# Patient Record
Sex: Male | Born: 1935 | Race: White | Hispanic: No | Marital: Single | Smoking: Never smoker
Health system: Southern US, Community
[De-identification: ages and names within clinical notes are randomized; demographics above are authoritative.]

## PROBLEM LIST (undated history)

## (undated) DIAGNOSIS — I639 Cerebral infarction, unspecified: Secondary | ICD-10-CM

## (undated) DIAGNOSIS — R0602 Shortness of breath: Secondary | ICD-10-CM

## (undated) DIAGNOSIS — K2270 Barrett's esophagus without dysplasia: Secondary | ICD-10-CM

## (undated) DIAGNOSIS — N289 Disorder of kidney and ureter, unspecified: Secondary | ICD-10-CM

## (undated) DIAGNOSIS — M1990 Unspecified osteoarthritis, unspecified site: Secondary | ICD-10-CM

## (undated) DIAGNOSIS — D649 Anemia, unspecified: Secondary | ICD-10-CM

## (undated) DIAGNOSIS — K635 Polyp of colon: Secondary | ICD-10-CM

## (undated) DIAGNOSIS — F0390 Unspecified dementia without behavioral disturbance: Secondary | ICD-10-CM

## (undated) DIAGNOSIS — F17220 Nicotine dependence, chewing tobacco, uncomplicated: Secondary | ICD-10-CM

## (undated) DIAGNOSIS — Z9889 Other specified postprocedural states: Secondary | ICD-10-CM

## (undated) HISTORY — PX: ESOPHAGOGASTRODUODENOSCOPY: SHX1529

## (undated) HISTORY — PX: COLONOSCOPY: SHX174

## (undated) SURGERY — Surgical Case
Anesthesia: *Unknown

---

## 1968-06-25 HISTORY — PX: OTHER SURGICAL HISTORY: SHX169

## 1999-01-04 ENCOUNTER — Inpatient Hospital Stay (HOSPITAL_COMMUNITY): Admission: RE | Admit: 1999-01-04 | Discharge: 1999-01-05 | Payer: Self-pay | Admitting: *Deleted

## 1999-01-04 ENCOUNTER — Encounter: Payer: Self-pay | Admitting: *Deleted

## 1999-08-14 ENCOUNTER — Other Ambulatory Visit: Admission: RE | Admit: 1999-08-14 | Discharge: 1999-08-14 | Payer: Self-pay | Admitting: Gastroenterology

## 1999-08-14 ENCOUNTER — Encounter (INDEPENDENT_AMBULATORY_CARE_PROVIDER_SITE_OTHER): Payer: Self-pay

## 2001-02-05 ENCOUNTER — Encounter: Admission: RE | Admit: 2001-02-05 | Discharge: 2001-02-05 | Payer: Self-pay | Admitting: Neurosurgery

## 2001-02-05 ENCOUNTER — Encounter: Payer: Self-pay | Admitting: Neurosurgery

## 2001-02-19 ENCOUNTER — Encounter: Payer: Self-pay | Admitting: Neurosurgery

## 2001-02-19 ENCOUNTER — Encounter: Admission: RE | Admit: 2001-02-19 | Discharge: 2001-02-19 | Payer: Self-pay | Admitting: Neurosurgery

## 2001-03-12 ENCOUNTER — Encounter: Admission: RE | Admit: 2001-03-12 | Discharge: 2001-03-12 | Payer: Self-pay | Admitting: Neurosurgery

## 2001-03-12 ENCOUNTER — Encounter: Payer: Self-pay | Admitting: Neurosurgery

## 2002-04-13 ENCOUNTER — Encounter: Payer: Self-pay | Admitting: Orthopedic Surgery

## 2002-04-13 ENCOUNTER — Encounter: Admission: RE | Admit: 2002-04-13 | Discharge: 2002-04-13 | Payer: Self-pay | Admitting: Orthopedic Surgery

## 2005-04-17 ENCOUNTER — Ambulatory Visit: Payer: Self-pay | Admitting: Gastroenterology

## 2005-05-07 ENCOUNTER — Ambulatory Visit: Payer: Self-pay | Admitting: Gastroenterology

## 2005-05-14 ENCOUNTER — Ambulatory Visit: Payer: Self-pay | Admitting: Gastroenterology

## 2005-05-14 ENCOUNTER — Encounter (INDEPENDENT_AMBULATORY_CARE_PROVIDER_SITE_OTHER): Payer: Self-pay | Admitting: *Deleted

## 2005-05-14 DIAGNOSIS — K635 Polyp of colon: Secondary | ICD-10-CM

## 2005-05-14 DIAGNOSIS — Z9889 Other specified postprocedural states: Secondary | ICD-10-CM

## 2005-05-14 DIAGNOSIS — K2270 Barrett's esophagus without dysplasia: Secondary | ICD-10-CM

## 2005-05-14 HISTORY — DX: Other specified postprocedural states: Z98.890

## 2005-05-14 HISTORY — DX: Barrett's esophagus without dysplasia: K22.70

## 2005-05-14 HISTORY — DX: Polyp of colon: K63.5

## 2005-06-05 ENCOUNTER — Ambulatory Visit: Payer: Self-pay | Admitting: Gastroenterology

## 2006-06-25 HISTORY — PX: PROSTATE SURGERY: SHX751

## 2007-06-05 ENCOUNTER — Ambulatory Visit: Payer: Self-pay | Admitting: Internal Medicine

## 2007-06-05 ENCOUNTER — Inpatient Hospital Stay (HOSPITAL_COMMUNITY): Admission: EM | Admit: 2007-06-05 | Discharge: 2007-06-08 | Payer: Self-pay | Admitting: Emergency Medicine

## 2010-11-07 NOTE — Procedures (Signed)
[name], [name]               ACCOUNT NO.:  [id number]   MEDICAL RECORD NO.:  [id number]          PATIENT TYPE:  INP   LOCATION:  A228                          FACILITY:  [facility]   PHYSICIAN:  Pricilla Riffle, MD, FACCDATE OF BIRTH:  Aug 14, 1926   DATE OF PROCEDURE:  DATE OF DISCHARGE:                                ECHOCARDIOGRAM   INDICATIONS:  A 77 year old with history of TIA.   A 2-D echo with echo Doppler, left ventricles grossly normal in size.  End-diastolic dimension is 43 mm.  Interventricular septum and posterior  wall are mildly thickened at 14 and 15 mm each.  Left atrium is normal  at 39 mm.  Right atrium and right ventricle are normal.   The aortic valve is trileaflet, is mild to mild sclerosis, no stenosis,  no insufficiency.  Mitral valve is minimally thickened.  There is no  insufficiency.  Pulmonic valve is normal.  Tricuspid valve is normal  with trace insufficiency.   Overall LV systolic function is normal with an LVEF of approximately 55%  to 60%.  RVEF is normal.   Trivial pericardial effusion is seen.      Pricilla Riffle, MD, [facility]  Electronically Signed     PVR/MEDQ  D:  06/05/2007  T:  06/05/2007  Job:  [id number]

## 2010-11-07 NOTE — H&P (Signed)
[name], [name]               ACCOUNT NO.:  [id number]   MEDICAL RECORD NO.:  [id number]          PATIENT TYPE:  INP   LOCATION:  A228                          FACILITY:  [facility]   PHYSICIAN:  Tesfaye D. Felecia Shelling, MD   DATE OF BIRTH:  1926/10/05   DATE OF ADMISSION:  06/05/2007  DATE OF DISCHARGE:  LH                              HISTORY & PHYSICAL   CHIEF COMPLAINT:  Left-sided weakness.   HISTORY OF PRESENT ILLNESS:  This is a 77 year old male patient with  history of multiple medical illnesses, who was a resident of [facility].  He was brought to the emergency room with the above  complaint.  According to the nursing staff, the patient had a left-sided  weakness and drowsiness.  His blood sugar was 38.  The patient was given  D50 and he was brought to the emergency room.  By the time he was  evaluated in the emergency room, his weakness was gradually improving.  The patient was evaluated in the emergency room and he had a CT scan of  the head, which was negative for any acute findings.  The patient was  admitted under telemetry for further evaluation.   REVIEW OF SYSTEMS:  The patient denies headache, cough, fever or chills,  chest pain, nausea or vomiting, abdominal pain, dysuria, urgency or  frequency of urination.   PAST MEDICAL HISTORY:  1. History of accidental fall.  2. History of left humerus fracture, secondary to the above.  3. Anemia.  4. History of ataxia, etiology unknown.  5. Hypertension.  6. Dementia.  7. Diabetes mellitus.  8. History of carcinoma of the prostate.   CURRENT MEDICATIONS:  1. Actos 15 mg daily.  2. Celexa 20 mg daily.  3. Dilantin 320 mg daily.  4. Accu-Chek with sliding scale.  5. Glipizide 10 mg daily.  6. Metoprolol 180 mg daily.  7. Pyridium 100 mg t.i.d.  8. Xanax 0.5 mg daily.  9. Lortab 5/500 one tablet p.o. q.6 h.   SOCIAL HISTORY:  The patient is a resident of [facility].  No  history of alcohol, tobacco or  substance abuse.   PHYSICAL EXAMINATION:  The patient is alert, awake and responding to  verbal communication.Marland Kitchen  VITAL SIGNS:  Blood pressure 164/66, pulse 68, respiratory rate 20,  temperature 98 degrees Fahrenheit.  HEENT: Pupils are equal and reactive.  NECK:  Supple.  CHEST: Clear lung fields; good air entry.  CARDIOVASCULAR SYSTEM:  First and second heart sound heard.  No murmur,  no gallop.  ABDOMEN:  Soft and lax.  Bowel sounds are positive.  No mass  or organomegaly.  EXTREMITIES:  No leg edema.   LABS ON ADMISSION:  Sodium 141, potassium 4.7, chloride 114, carbon  dioxide 22, glucose 47, BUN 22, creatinine 1.7. calcium 9.3.  CBC:  WBC  5.7, hemoglobin 10.3, hematocrit 31.5, platelets 179.  Urinalysis:  Specific gravity 1020, pH 5.0, nitrite positive, glucose trace negative  and leukocytes negative.   ASSESSMENT:  1. TRANSIENT LEFT-SIDED WEAKNESS.  To rule out cerebrovascular  accident  versus transient ischemic attack.  2. Hypoglycemia.  3. Hypertension.  4. History of accidental fall.  With left humerus fracture.  5. History of ataxia.  6. History of carcinoma of the prostate.  7. Anemia.   PLAN:  Will admit the patient under telemetry.  Will do neurologic  checks q.4 h.  Will obtain a neurology consult.  Will schedule for  carotid Doppler and echocardiogram.  Will continue regular medications.      Tesfaye D. Felecia Shelling, MD  Electronically Signed     TDF/MEDQ  D:  06/05/2007  T:  06/05/2007  Job:  [id number]

## 2010-11-10 NOTE — Discharge Summary (Signed)
[name], [name]               ACCOUNT NO.:  [id number]   MEDICAL RECORD NO.:  [id number]          PATIENT TYPE:  INP   LOCATION:  A228                          FACILITY:  [facility]   PHYSICIAN:  Tesfaye D. Felecia Shelling, MD   DATE OF BIRTH:  [birth year]   DATE OF ADMISSION:  06/05/2007  DATE OF DISCHARGE:  12/14/2008LH                               DISCHARGE SUMMARY   DISCHARGE DIAGNOSES:  1. Transient right-sided weakness, probably secondary to transient      ischemic attack.  2. History of recurrent falls, with left humerus fracture.  3. Diabetes mellitus.  4. Hypertension.  5. History of dementia.   HOSPITAL COURSE:  This is a 77 year old male patient with history of  multiple medical illnesses who was a resident of [facility],  who suddenly developed left-sided weakness.  At the time of evaluation,  the patient had a blood sugar of 38.  She was given D50, and her blood  sugar improved.  Her CT scan of the head and later MRA were negative for  any acute change.  The patient improved over the hospital stay, and she  was discharged to home in a stable condition, to continue her regular  treatment.      Tesfaye D. Felecia Shelling, MD  Electronically Signed     TDF/MEDQ  D:  06/30/2007  T:  06/30/2007  Job:  [id number]

## 2011-04-02 LAB — DIFFERENTIAL
Basophils Relative: 0
Eosinophils Absolute: 0.1 — ABNORMAL LOW
Eosinophils Relative: 2
Lymphs Abs: 1
Monocytes Absolute: 0.4
Monocytes Relative: 8

## 2011-04-02 LAB — URINALYSIS, ROUTINE W REFLEX MICROSCOPIC
Glucose, UA: NEGATIVE
Hgb urine dipstick: NEGATIVE
Ketones, ur: NEGATIVE
Protein, ur: 30 — AB
Specific Gravity, Urine: 1.02

## 2011-04-02 LAB — PROTIME-INR: INR: 1

## 2011-04-02 LAB — CBC
MCV: 90.7
Platelets: 179
RBC: 3.48 — ABNORMAL LOW
RDW: 15.3

## 2011-04-02 LAB — COMPREHENSIVE METABOLIC PANEL
ALT: 10
AST: 15
Albumin: 3.1 — ABNORMAL LOW
CO2: 24
Calcium: 9.3
GFR calc Af Amer: 53 — ABNORMAL LOW
Total Protein: 7.2

## 2011-04-19 ENCOUNTER — Emergency Department (HOSPITAL_COMMUNITY): Payer: Medicare PPO

## 2011-04-19 ENCOUNTER — Other Ambulatory Visit (HOSPITAL_COMMUNITY): Payer: PRIVATE HEALTH INSURANCE

## 2011-04-19 ENCOUNTER — Inpatient Hospital Stay (HOSPITAL_COMMUNITY)
Admission: EM | Admit: 2011-04-19 | Discharge: 2011-04-26 | DRG: 563 | Disposition: A | Discharge status: Skilled Nursing Facility | Payer: Medicare PPO | Attending: Internal Medicine | Admitting: Internal Medicine

## 2011-04-19 ENCOUNTER — Other Ambulatory Visit: Payer: Self-pay

## 2011-04-19 ENCOUNTER — Encounter: Payer: Self-pay | Admitting: *Deleted

## 2011-04-19 DIAGNOSIS — N182 Chronic kidney disease, stage 2 (mild): Secondary | ICD-10-CM | POA: Diagnosis present

## 2011-04-19 DIAGNOSIS — K209 Esophagitis, unspecified without bleeding: Secondary | ICD-10-CM | POA: Diagnosis present

## 2011-04-19 DIAGNOSIS — F0390 Unspecified dementia without behavioral disturbance: Secondary | ICD-10-CM | POA: Diagnosis present

## 2011-04-19 DIAGNOSIS — D126 Benign neoplasm of colon, unspecified: Secondary | ICD-10-CM | POA: Diagnosis present

## 2011-04-19 DIAGNOSIS — N179 Acute kidney failure, unspecified: Secondary | ICD-10-CM | POA: Diagnosis present

## 2011-04-19 DIAGNOSIS — D638 Anemia in other chronic diseases classified elsewhere: Secondary | ICD-10-CM | POA: Diagnosis present

## 2011-04-19 DIAGNOSIS — N400 Enlarged prostate without lower urinary tract symptoms: Secondary | ICD-10-CM | POA: Diagnosis present

## 2011-04-19 DIAGNOSIS — I129 Hypertensive chronic kidney disease with stage 1 through stage 4 chronic kidney disease, or unspecified chronic kidney disease: Secondary | ICD-10-CM | POA: Diagnosis present

## 2011-04-19 DIAGNOSIS — E119 Type 2 diabetes mellitus without complications: Secondary | ICD-10-CM | POA: Diagnosis present

## 2011-04-19 DIAGNOSIS — I10 Essential (primary) hypertension: Secondary | ICD-10-CM | POA: Diagnosis present

## 2011-04-19 DIAGNOSIS — K620 Anal polyp: Secondary | ICD-10-CM | POA: Diagnosis present

## 2011-04-19 DIAGNOSIS — D649 Anemia, unspecified: Secondary | ICD-10-CM | POA: Diagnosis present

## 2011-04-19 DIAGNOSIS — S42213A Unspecified displaced fracture of surgical neck of unspecified humerus, initial encounter for closed fracture: Principal | ICD-10-CM | POA: Diagnosis present

## 2011-04-19 DIAGNOSIS — I639 Cerebral infarction, unspecified: Secondary | ICD-10-CM | POA: Diagnosis present

## 2011-04-19 DIAGNOSIS — K259 Gastric ulcer, unspecified as acute or chronic, without hemorrhage or perforation: Secondary | ICD-10-CM | POA: Diagnosis present

## 2011-04-19 DIAGNOSIS — K2270 Barrett's esophagus without dysplasia: Secondary | ICD-10-CM | POA: Diagnosis present

## 2011-04-19 DIAGNOSIS — W19XXXA Unspecified fall, initial encounter: Secondary | ICD-10-CM | POA: Diagnosis present

## 2011-04-19 DIAGNOSIS — K648 Other hemorrhoids: Secondary | ICD-10-CM | POA: Diagnosis present

## 2011-04-19 DIAGNOSIS — Z23 Encounter for immunization: Secondary | ICD-10-CM

## 2011-04-19 DIAGNOSIS — T39095A Adverse effect of salicylates, initial encounter: Secondary | ICD-10-CM | POA: Diagnosis present

## 2011-04-19 DIAGNOSIS — S42209A Unspecified fracture of upper end of unspecified humerus, initial encounter for closed fracture: Secondary | ICD-10-CM | POA: Diagnosis present

## 2011-04-19 HISTORY — DX: Polyp of colon: K63.5

## 2011-04-19 HISTORY — DX: Barrett's esophagus without dysplasia: K22.70

## 2011-04-19 HISTORY — DX: Other specified postprocedural states: Z98.890

## 2011-04-19 HISTORY — DX: Unspecified dementia without behavioral disturbance: F03.90

## 2011-04-19 HISTORY — DX: Anemia, unspecified: D64.9

## 2011-04-19 HISTORY — DX: Shortness of breath: R06.02

## 2011-04-19 HISTORY — DX: Unspecified dementia, unspecified severity, without behavioral disturbance, psychotic disturbance, mood disturbance, and anxiety: F03.90

## 2011-04-19 HISTORY — DX: Unspecified osteoarthritis, unspecified site: M19.90

## 2011-04-19 HISTORY — DX: Nicotine dependence, chewing tobacco, uncomplicated: F17.220

## 2011-04-19 LAB — BASIC METABOLIC PANEL
BUN: 36 mg/dL — ABNORMAL HIGH (ref 6–23)
CO2: 20 mEq/L (ref 19–32)
Calcium: 9.8 mg/dL (ref 8.4–10.5)
Creatinine, Ser: 2.73 mg/dL — ABNORMAL HIGH (ref 0.50–1.35)
GFR calc non Af Amer: 20 mL/min — ABNORMAL LOW (ref >90)
Glucose, Bld: 81 mg/dL (ref 70–99)
Sodium: 138 mEq/L (ref 135–145)

## 2011-04-19 LAB — CBC
HCT: 25.5 % — ABNORMAL LOW (ref 39.0–52.0)
Hemoglobin: 8.1 g/dL — ABNORMAL LOW (ref 13.0–17.0)
MCH: 28.7 pg (ref 26.0–34.0)
MCHC: 31.8 g/dL (ref 30.0–36.0)
MCV: 90.4 fL (ref 78.0–100.0)
RBC: 2.82 MIL/uL — ABNORMAL LOW (ref 4.22–5.81)
RDW: 14.5 % (ref 11.5–15.5)
WBC: 11.2 10*3/uL — ABNORMAL HIGH (ref 4.0–10.5)

## 2011-04-19 LAB — GLUCOSE, CAPILLARY
Glucose-Capillary: 109 mg/dL — ABNORMAL HIGH (ref 70–99)
Glucose-Capillary: 85 mg/dL (ref 70–99)

## 2011-04-19 LAB — DIFFERENTIAL
Basophils Absolute: 0 10*3/uL (ref 0.0–0.1)
Basophils Relative: 0 % (ref 0–1)
Lymphs Abs: 0.5 10*3/uL — ABNORMAL LOW (ref 0.7–4.0)
Monocytes Absolute: 0.7 10*3/uL (ref 0.1–1.0)
Neutro Abs: 10 10*3/uL — ABNORMAL HIGH (ref 1.7–7.7)
Neutrophils Relative %: 89 % — ABNORMAL HIGH (ref 43–77)

## 2011-04-19 LAB — URINALYSIS, ROUTINE W REFLEX MICROSCOPIC
Protein, ur: NEGATIVE mg/dL
Specific Gravity, Urine: 1.02 (ref 1.005–1.030)
Urobilinogen, UA: 0.2 mg/dL (ref 0.0–1.0)

## 2011-04-19 LAB — RETICULOCYTES: Retic Count, Absolute: 45.2 10*3/uL (ref 19.0–186.0)

## 2011-04-19 MED ORDER — TAMSULOSIN HCL 0.4 MG PO CAPS
0.4000 mg | ORAL_CAPSULE | Freq: Every day | ORAL | Status: DC
  Administered 2011-04-20 – 2011-04-26 (×7): 0.4 mg via ORAL
  Filled 2011-04-19 (×8): qty 1

## 2011-04-19 MED ORDER — ASPIRIN 300 MG RE SUPP
300.0000 mg | Freq: Every day | RECTAL | Status: DC
  Filled 2011-04-19 (×4): qty 1

## 2011-04-19 MED ORDER — ONDANSETRON HCL 4 MG/2ML IJ SOLN: 4.0000 mg | Freq: Four times a day (QID) | INTRAMUSCULAR | Status: DC | PRN

## 2011-04-19 MED ORDER — INSULIN ASPART 100 UNIT/ML ~~LOC~~ SOLN
0.0000 [IU] | Freq: Three times a day (TID) | SUBCUTANEOUS | Status: DC
  Administered 2011-04-20: 1 [IU] via SUBCUTANEOUS
  Administered 2011-04-20 – 2011-04-22 (×2): 2 [IU] via SUBCUTANEOUS
  Administered 2011-04-22 – 2011-04-26 (×3): 1 [IU] via SUBCUTANEOUS
  Filled 2011-04-19: qty 3
  Filled 2011-04-19: qty 10

## 2011-04-19 MED ORDER — SENNA 8.6 MG PO TABS
2.0000 | ORAL_TABLET | Freq: Every day | ORAL | Status: DC | PRN
  Filled 2011-04-19: qty 2

## 2011-04-19 MED ORDER — OLMESARTAN MEDOXOMIL 20 MG PO TABS
20.0000 mg | ORAL_TABLET | Freq: Every day | ORAL | Status: DC
  Filled 2011-04-19 (×2): qty 1

## 2011-04-19 MED ORDER — CHLORHEXIDINE GLUCONATE 0.12 % MT SOLN
15.0000 mL | Freq: Two times a day (BID) | OROMUCOSAL | Status: DC
  Administered 2011-04-19 – 2011-04-25 (×11): 15 mL via OROMUCOSAL
  Filled 2011-04-19 (×11): qty 15

## 2011-04-19 MED ORDER — SODIUM CHLORIDE 0.9 % IV SOLN
INTRAVENOUS | Status: DC
  Administered 2011-04-19: 15:00:00 via INTRAVENOUS

## 2011-04-19 MED ORDER — LIDOCAINE VISCOUS 2 % MT SOLN: 20.0000 mL | OROMUCOSAL | Status: DC | PRN

## 2011-04-19 MED ORDER — ONDANSETRON HCL 4 MG PO TABS: 4.0000 mg | ORAL_TABLET | Freq: Four times a day (QID) | ORAL | Status: DC | PRN

## 2011-04-19 MED ORDER — GUAIFENESIN-DM 100-10 MG/5ML PO SYRP: 5.0000 mL | ORAL_SOLUTION | ORAL | Status: DC | PRN

## 2011-04-19 MED ORDER — POLYETHYLENE GLYCOL 3350 17 G PO PACK
17.0000 g | PACK | Freq: Every day | ORAL | Status: DC
  Administered 2011-04-20 – 2011-04-23 (×4): 17 g via ORAL
  Filled 2011-04-19 (×5): qty 1

## 2011-04-19 MED ORDER — INSULIN ASPART 100 UNIT/ML ~~LOC~~ SOLN: 0.0000 [IU] | Freq: Three times a day (TID) | SUBCUTANEOUS | Status: DC

## 2011-04-19 MED ORDER — ASPIRIN EC 325 MG PO TBEC
325.0000 mg | DELAYED_RELEASE_TABLET | Freq: Every day | ORAL | Status: DC
  Administered 2011-04-20: 325 mg via ORAL
  Filled 2011-04-19: qty 1

## 2011-04-19 MED ORDER — ALBUTEROL SULFATE (5 MG/ML) 0.5% IN NEBU: 2.5000 mg | INHALATION_SOLUTION | RESPIRATORY_TRACT | Status: DC | PRN

## 2011-04-19 MED ORDER — HYDROCODONE-ACETAMINOPHEN 5-325 MG PO TABS
1.0000 | ORAL_TABLET | ORAL | Status: DC | PRN
  Administered 2011-04-20: 2 via ORAL
  Administered 2011-04-20: 1 via ORAL
  Administered 2011-04-21 – 2011-04-24 (×3): 2 via ORAL
  Filled 2011-04-19: qty 2
  Filled 2011-04-19: qty 1
  Filled 2011-04-19 (×3): qty 2

## 2011-04-19 MED ORDER — ACETAMINOPHEN 325 MG PO TABS: 650.0000 mg | ORAL_TABLET | Freq: Four times a day (QID) | ORAL | Status: DC | PRN

## 2011-04-19 MED ORDER — ALUM & MAG HYDROXIDE-SIMETH 200-200-20 MG/5ML PO SUSP: 30.0000 mL | Freq: Four times a day (QID) | ORAL | Status: DC | PRN

## 2011-04-19 MED ORDER — ZOLPIDEM TARTRATE 5 MG PO TABS
5.0000 mg | ORAL_TABLET | Freq: Every evening | ORAL | Status: DC | PRN
  Administered 2011-04-19: 5 mg via ORAL
  Filled 2011-04-19: qty 1

## 2011-04-19 MED ORDER — SENNOSIDES-DOCUSATE SODIUM 8.6-50 MG PO TABS
1.0000 | ORAL_TABLET | Freq: Every evening | ORAL | Status: DC | PRN
  Filled 2011-04-19: qty 1

## 2011-04-19 MED ORDER — PNEUMOCOCCAL VAC POLYVALENT 25 MCG/0.5ML IJ INJ
0.5000 mL | INJECTION | INTRAMUSCULAR | Status: AC
  Administered 2011-04-20: 0.5 mL via INTRAMUSCULAR
  Filled 2011-04-19: qty 0.5

## 2011-04-19 MED ORDER — BIOTENE DRY MOUTH MT LIQD
Freq: Three times a day (TID) | OROMUCOSAL | Status: DC
  Administered 2011-04-20 (×3): 1 via OROMUCOSAL
  Administered 2011-04-21 – 2011-04-26 (×13): via OROMUCOSAL

## 2011-04-19 MED ORDER — DEXTROSE 50 % IV SOLN
INTRAVENOUS | Status: AC
  Administered 2011-04-19: 50 mL
  Filled 2011-04-19: qty 50

## 2011-04-19 MED ORDER — METOPROLOL TARTRATE 50 MG PO TABS
100.0000 mg | ORAL_TABLET | Freq: Two times a day (BID) | ORAL | Status: DC
  Administered 2011-04-19 – 2011-04-26 (×14): 100 mg via ORAL
  Filled 2011-04-19 (×9): qty 2
  Filled 2011-04-19: qty 1
  Filled 2011-04-19 (×4): qty 2

## 2011-04-19 MED ORDER — MORPHINE SULFATE 2 MG/ML IJ SOLN
1.0000 mg | INTRAMUSCULAR | Status: DC | PRN
  Administered 2011-04-20: 1 mg via INTRAVENOUS
  Filled 2011-04-19: qty 1

## 2011-04-19 MED ORDER — SODIUM CHLORIDE 0.9 % IV SOLN
Freq: Once | INTRAVENOUS | Status: AC
  Administered 2011-04-19: 13:00:00 via INTRAVENOUS

## 2011-04-19 MED ORDER — HEPARIN SODIUM (PORCINE) 5000 UNIT/ML IJ SOLN
5000.0000 [IU] | Freq: Three times a day (TID) | INTRAMUSCULAR | Status: DC
  Administered 2011-04-19 – 2011-04-20 (×3): 5000 [IU] via SUBCUTANEOUS
  Filled 2011-04-19 (×9): qty 1

## 2011-04-19 MED ORDER — ACETAMINOPHEN 650 MG RE SUPP: 650.0000 mg | Freq: Four times a day (QID) | RECTAL | Status: DC | PRN

## 2011-04-19 MED ORDER — INFLUENZA VIRUS VACC SPLIT PF IM SUSP
0.5000 mL | Freq: Once | INTRAMUSCULAR | Status: AC
  Administered 2011-04-20: 0.5 mL via INTRAMUSCULAR
  Filled 2011-04-19: qty 0.5

## 2011-04-19 MED ORDER — METOPROLOL TARTRATE 25 MG/10 ML ORAL SUSPENSION: 100.0000 mg | Freq: Two times a day (BID) | ORAL | Status: DC

## 2011-04-19 NOTE — ED Notes (Signed)
D 50 given in radiology Pt tolerated well.

## 2011-04-19 NOTE — ED Provider Notes (Signed)
History     CSN: [id number] Arrival date & time: 04/19/2011 11:55 AM   First MD Initiated Contact with Patient 04/19/11 1157      Chief Complaint  Patient presents with  . Fall  . Bleeding/Bruising    HPI Level 5 caveat applies due to altered mental status of patient.  Patient's son in room during interview. According to the patient, he reports stumbling on the side walk and falling. He notified people in his apartment that he fell, and they called EMS who brought patient to hospital. Patient also fell last week according to his son. But patient refused to see a doctor. He suffered contusions on his right shoulder and a laceration on his left forehead. Son feels patient's balance has not been good recently and questions a possible "inner ear problem". Son does not think he lost consciousness during either of these episodes.  Son says patient's answers to questions are unreliable.  Patient lives alone. Son and daughter (who lives in [place]) have been encouraging patient to go to nursing home but patient resistant and seems to have improvement in his ADLs whenever this is mentioned. Has a walker at home that he uses intermittently. Patient denies using his walker today.    Past Medical History  Diagnosis Date  . Diabetes mellitus     No past surgical history on file.  No family history on file.  History  Substance Use Topics  . Smoking status: Not on file  . Smokeless tobacco: Not on file  . Alcohol Use:       Review of Systems  Constitutional: Negative for fever and chills.  Respiratory: Negative for chest tightness.   Psychiatric/Behavioral: Positive for confusion (Today seems very confused according to his son).    Allergies  Review of patient's allergies indicates no known allergies.  Home Medications  No current outpatient prescriptions on file.  BP 177/58  Pulse 97  Resp 22  SpO2 95%  Physical Exam  Constitutional: No distress.  HENT:  Right Ear:  External ear normal.  Left Ear: External ear normal.  Mouth/Throat: Oropharynx is clear and moist.       2 cm laceration on left fore head that has scabbed over with mild surrounding erythema   Eyes: Conjunctivae are normal. Pupils are equal, round, and reactive to light. Right eye exhibits no discharge. Left eye exhibits no discharge.  Neck: Normal range of motion. Neck supple.  Cardiovascular: Normal rate, regular rhythm and normal heart sounds.  Exam reveals no gallop and no friction rub.   No murmur heard. Pulmonary/Chest: Effort normal and breath sounds normal. No respiratory distress. He has no wheezes. He has no rales.  Abdominal: Soft. Bowel sounds are normal. Distention: obese. There is no tenderness. There is no guarding.  Musculoskeletal: He exhibits no edema and no tenderness.  Lymphadenopathy:    He has no cervical adenopathy.  Neurological: GCS motor subscore is 5.       Hand strength 4-5/5 bilaterally Shoulder strength on right difficult to assess due to pain  4/5 strength lower extremities bilaterally  Neuro exam limited by patient's confusion   Skin: He is not diaphoretic.     Psychiatric: His affect is inappropriate. His affect is not labile. His speech is tangential. He is not agitated, not aggressive, is not hyperactive, not slowed, not withdrawn, not actively hallucinating and not combative. Cognition and memory are impaired. He expresses inappropriate judgment. He does not exhibit a depressed mood. He is noncommunicative. He exhibits  abnormal recent memory and abnormal remote memory.       Alert and oriented to name and place but not to year ("2009"). Answers questions some appropriately and some inappropriately. Son questions the validity of his answers. Babbles to himself.  He is inattentive.    ED Course  Procedures (including critical care time)  Labs Reviewed  BASIC METABOLIC PANEL - Abnormal; Notable for the following:    Potassium 5.6 (*)    BUN 36 (*)     Creatinine, Ser 2.73 (*)    GFR calc non Af Amer 20 (*)    GFR calc Af Amer 23 (*)    All other components within normal limits  CBC - Abnormal; Notable for the following:    WBC 11.2 (*)    RBC 2.82 (*)    Hemoglobin 8.1 (*)    HCT 25.5 (*)    All other components within normal limits  DIFFERENTIAL - Abnormal; Notable for the following:    Neutrophils Relative 89 (*)    Neutro Abs 10.0 (*)    Lymphocytes Relative 5 (*)    Lymphs Abs 0.5 (*)    All other components within normal limits  URINALYSIS, ROUTINE W REFLEX MICROSCOPIC - Abnormal; Notable for the following:    Bilirubin Urine SMALL (*)    Ketones, ur TRACE (*)    All other components within normal limits  CARDIAC PANEL(CRET KIN+CKTOT+MB+TROPI) - Abnormal; Notable for the following:    Total CK 663 (*)    CK, MB 8.7 (*)    All other components within normal limits   Dg Chest 1 View  04/19/2011  *RADIOLOGY REPORT*  Clinical Data: 77 year old male with right shoulder pain and bruising.  Fall 2 days ago.  CHEST - 1 VIEW  Comparison: 06/05/2007.  Findings: AP semi upright portable view the chest at 1346 hours. Deformity of the proximal right humerus appears represent a comminuted humeral head and surgical neck fracture.  There also appears to be heterotopic calcification about the right humeral head which is new from 2008.  Stable lung volumes.  Cardiac size and mediastinal contours are within normal limits.  No pneumothorax, pulmonary edema, pleural effusion or confluent pulmonary opacity.  IMPRESSION: 1.  Right proximal humerus deformity.  There also appears to be heterotopic ossification about the femoral head which is new from 2008.  See shoulder series. 2. No acute cardiopulmonary abnormality.  Original Report Authenticated By: Harley Hallmark, M.D.   Dg Shoulder Right  04/19/2011  *RADIOLOGY REPORT*  Clinical Data: Fall.  Right shoulder pain.  RIGHT SHOULDER - 2+ VIEW  Comparison: Plain film chest 06/05/2007.  Findings: The  patient has an impacted surgical neck fracture of the humerus which which appears to involve the greater tuberosity.   No dislocation.  Acromioclavicular joint is intact with degenerative change noted.  Imaged right lung and ribs are unremarkable.  IMPRESSION: Impacted surgical neck fracture right humerus appears somewhat greater tuberosity.  Acromioclavicular osteoarthritis.  Original Report Authenticated By: Bernadene Bell. D'ALESSIO, M.D.   Dg Forearm Right  04/19/2011  *RADIOLOGY REPORT*  Clinical Data: Fall, pain.  RIGHT FOREARM - 2 VIEW  Comparison: None.  Findings: No acute bony or joint abnormality is identified.  The patient has old healed fractures of the distal radius and ulna.  IMPRESSION: No acute finding.  Old distal radius and ulnar fractures.  Original Report Authenticated By: Bernadene Bell. Maricela Curet, M.D.   Ct Head Wo Contrast  04/19/2011  *RADIOLOGY REPORT*  Clinical Data:  Status post fall.  Laceration of forehead.  CT HEAD WITHOUT CONTRAST  Technique:  Contiguous axial images were obtained from the base of the skull through the vertex without contrast.  Comparison: Head CT scan 06/05/2007.  Findings: Laceration is seen over the right frontal bone.  No underlying fracture is identified.  The brain demonstrates mild cortical atrophy and some chronic microvascular ischemic change. No evidence of acute infarction, hemorrhage, mass lesion, mass effect, midline shift or abnormal extra-axial fluid collection.  No hydrocephalus or pneumocephalus.  IMPRESSION: Laceration over the right frontal bone.  No underlying fracture or acute intracranial abnormality.  Original Report Authenticated By: Bernadene Bell. Maricela Curet, M.D.    Date: 04/19/2011  Rate: 97   Rhythm: normal sinus rhythm  QRS Axis: normal  Intervals: PR 208, QTc 444, QRS 108  ST/T Wave abnormalities: nonspecific ST changes  Conduction Disutrbances:right bundle branch block  Narrative Interpretation:   Old EKG Reviewed: unchanged from previous  ECG December 2008, which showed RBBB and QRS 108   MDM  This is a 77 YO M who lives alone in an apartment presenting with his second fall today in the past week and with altered mental status. Studies today show that he has an impacted right humerus fracture and dehydration with moderately elevated CK and K.   Spoke with hospitalist Dr. Jerral Ralph who will admit patient.   Spoke with orthopedist on call Dr. Romeo Apple regarding humeral neck fracture. He recommends immobilization at this time. Patient will need follow-up with him in 1 week.       Lucianne [place] Resident 04/19/11 1438

## 2011-04-19 NOTE — ED Notes (Signed)
Received pt from [facility] secondary to a fall sustained Friday (04/13/11). Pt has multiple bruised areas in various stages of healing. Right. arm and shoulder,  Rt base of neck, Left knee, and Hand.  Pt has multiple abrasions noted , Areas include left forehead, left 4 th and 5 th digits and  Left knee.  Pt c/o rt shoulder pain. Pt is hard of hearing and is confused at times.

## 2011-04-19 NOTE — ED Notes (Signed)
Pt's family member notified nursing staff of pt "seeing things like bugs" . Assessed pt and pt was alert and oriented x 4.  Pt does slur and studder more than when he arrived.

## 2011-04-19 NOTE — H&P (Signed)
PCP:   Ignatius Specking., MD, MD   Chief Complaint:  Status post fall and subsequent right on pain.  HPI: Patient is 77 year old white male who lives alone who has a past medical history of diabetes, hypertension, BPH, and some amount of dementia was brought into the hospital for the above noted complaints. Note that this patient is a very poor historian. Most of his history is obtained from talking to the patient's son who was present at bedside. The patient's son patient fell last Thursday and this morning as well. Upon falling last Thursday patient has been having difficulty using his right arm. Family was concerned whether he actually had a fracture in that arm. However he was not brought to the ED or the family did not seek medical attention then. The patient sustained another fall this morning and was in significant amount of pain in Shouting, his neighbors heard him and came to his assistance. They apparently could not get him up on his feet and that is out EMS was called. Subsequently the patient was brought to an emergency room in the hospital service has been asked to admit this patient for further evaluation and treatment. Patient claims that when he fell he fell because he tripped and lost his balance and that he did not lose any consciousness. The son also claims that he thinks the patient fell because he may have been on Xanax or other benzodiazepines. Please note that I've asked patient repeatedly whether this was a syncopal episode and he denies this however he is very poor historian. The patient's son thinks that the patient's speech is slightly slurred than usual. He however is moving all of his 4 extremities, however his right extremity motion is limited because of pain. There is no history of chest pain or palpitations.  Review of Systems:  The patient denies anorexia, fever, weight loss,, vision loss, decreased hearing, hoarseness, chest pain, syncope, dyspnea on exertion, peripheral  edema, balance deficits, hemoptysis, abdominal pain, melena, hematochezia, severe indigestion/heartburn, hematuria, incontinence, genital sores, muscle weakness, suspicious skin lesions, transient blindness, difficulty walking, depression, unusual weight change, abnormal bleeding, enlarged lymph nodes, angioedema, and breast masses.  Past Medical History: Past Medical History  Diagnosis Date  . Diabetes mellitus Hypertension  BPH Dementia     No past surgical history on file.  Medications: I reviewed the patient's home medications and will be awaiting pharmacy for medical reconciliation. Prior to Admission medications   Not on File    Allergies:  No Known Allergies  Social History:  does not have a smoking history on file. He does not have any smokeless tobacco history on file. His alcohol and drug histories not on file. he is a retired Curator.  Family History: No family history on file.  Physical Exam: Blood pressure 182/124, pulse 97, temperature 98.2 F (36.8 C), resp. rate 15, SpO2 95.00%. General appearance: Awake mostly alert. Speech seems slightly dysarthric however I'm not sure whether this is his usual baseline. He has been numerous bruising in his extremities especially his upper extremity in the right. HEENT: He is a very small bruise and laceration on the right frontal region otherwise the rest of the exam is nontraumatic and normocephalic. Pupils equally reactive to light and accommodation. Neck : Supple Chest : Bilaterally clear. CVS : Heart sounds are regular. Questionable soft systolic murmur in the aortic area. Abdomen: Bowel sounds are present. The abdomen is soft nontender nondistended. He does have a lower midline scar. Extremities : Lower extremities  are free of edema. Does have bruising on his right knee area. He has extensive bruising and ecchymosis on his right shoulder area down to the middle of his right arm.  Neurology: Awake mostly alert. Minimal  dysarthria at best. He does not appear to have any focal deficits however he is not able to move his right arm 50 secondary to pain. Skin: Extensive bruising on his right upper extremity and also on his right extremities right lower extremity as well especially in the knee area. Wounds.N/A  Labs on Admission:   Basename 04/19/11 1245  NA 138  K 5.6*  CL 109  CO2 20  GLUCOSE 81  BUN 36*  CREATININE 2.73*  CALCIUM 9.8  MG --  PHOS --   No results found for this basename: AST:2,ALT:2,ALKPHOS:2,BILITOT:2,PROT:2,ALBUMIN:2 in the last 72 hours No results found for this basename: LIPASE:2,AMYLASE:2 in the last 72 hours  Basename 04/19/11 1245  WBC 11.2*  NEUTROABS 10.0*  HGB 8.1*  HCT 25.5*  MCV 90.4  PLT 235    Basename 04/19/11 1245  CKTOTAL 663*  CKMB 8.7*  CKMBINDEX --  TROPONINI <0.30   No results found for this basename: TSH,T4TOTAL,FREET3,T3FREE,THYROIDAB in the last 72 hours No results found for this basename: VITAMINB12:2,FOLATE:2,FERRITIN:2,TIBC:2,IRON:2,RETICCTPCT:2 in the last 72 hours  Radiological Exams on Admission: Dg Chest 1 View  04/19/2011  *RADIOLOGY REPORT*  Clinical Data: 77 year old male with right shoulder pain and bruising.  Fall 2 days ago.  CHEST - 1 VIEW  Comparison: 06/05/2007.  Findings: AP semi upright portable view the chest at 1346 hours. Deformity of the proximal right humerus appears represent a comminuted humeral head and surgical neck fracture.  There also appears to be heterotopic calcification about the right humeral head which is new from 2008.  Stable lung volumes.  Cardiac size and mediastinal contours are within normal limits.  No pneumothorax, pulmonary edema, pleural effusion or confluent pulmonary opacity.  IMPRESSION: 1.  Right proximal humerus deformity.  There also appears to be heterotopic ossification about the femoral head which is new from 2008.  See shoulder series. 2. No acute cardiopulmonary abnormality.  Original Report  Authenticated By: Harley Hallmark, M.D.   Dg Shoulder Right  04/19/2011  *RADIOLOGY REPORT*  Clinical Data: Fall.  Right shoulder pain.  RIGHT SHOULDER - 2+ VIEW  Comparison: Plain film chest 06/05/2007.  Findings: The patient has an impacted surgical neck fracture of the humerus which which appears to involve the greater tuberosity.   No dislocation.  Acromioclavicular joint is intact with degenerative change noted.  Imaged right lung and ribs are unremarkable.  IMPRESSION: Impacted surgical neck fracture right humerus appears somewhat greater tuberosity.  Acromioclavicular osteoarthritis.  Original Report Authenticated By: Bernadene Bell. D'ALESSIO, M.D.   Dg Forearm Right  04/19/2011  *RADIOLOGY REPORT*  Clinical Data: Fall, pain.  RIGHT FOREARM - 2 VIEW  Comparison: None.  Findings: No acute bony or joint abnormality is identified.  The patient has old healed fractures of the distal radius and ulna.  IMPRESSION: No acute finding.  Old distal radius and ulnar fractures.  Original Report Authenticated By: Bernadene Bell. Maricela Curet, M.D.   Ct Head Wo Contrast  04/19/2011  *RADIOLOGY REPORT*  Clinical Data: Status post fall.  Laceration of forehead.  CT HEAD WITHOUT CONTRAST  Technique:  Contiguous axial images were obtained from the base of the skull through the vertex without contrast.  Comparison: Head CT scan 06/05/2007.  Findings: Laceration is seen over the right frontal bone.  No  underlying fracture is identified.  The brain demonstrates mild cortical atrophy and some chronic microvascular ischemic change. No evidence of acute infarction, hemorrhage, mass lesion, mass effect, midline shift or abnormal extra-axial fluid collection.  No hydrocephalus or pneumocephalus.  IMPRESSION: Laceration over the right frontal bone.  No underlying fracture or acute intracranial abnormality.  Original Report Authenticated By: Bernadene Bell. Maricela Curet, M.D.    Assessment/Plan Present on Admission:  .Acute renal  failure: Patient does appear to have some amount of chronic kidney disease (chronic kidney disease stage II) on review of his prior labs in our system.the acute component is likely to be prerenal given the fact that this patient is following and has had very poor appetite over the past few days. We will gently hydrate him and follow his renal panel in the morning.   .Proximal humeral fracture This is secondary to fall, the ED physician has already placed a consultation with orthopedics.   .Fall Patient claims that this was a mechanical fall and not a syncopal episode. He does seem to have frequent falls. We will have physical therapy and occupational therapy evaluate this patient. His CT of the head is negative for any acute intracranial pathology.  .DM (diabetes mellitus) We will hold his glipizide and continue sliding scale insulin.   Marland KitchenHTN (hypertension) -This appears uncontrolled at present, the patient something that he has not had any of his medications today, as is her we will go and start him on his usual dosing of Lopressor and Diovan.   .CVA (cerebral infarction): -Patient's family and drooping that the patient has some amount of slurred speech, however however this is not a convincing. Patient may be at his usual baseline in terms of his speech. As noted above he does not have any focal deficits, however we go with an get an MRI of his pain to make sure that this was not an acute CVA.  Marland KitchenAnemia The patient has hemoglobin of 8.1, he has no overt evidence of bleeding. He denies hematochezia or melanotic stools to me. At this point we will obtain a fecal occult blood test and check an anemia panel. We will try and obtain her records from his primary care's office to see what the trend has been on this as well.   .Dementia -Patient appears to be very close to his usual baseline.  Marland KitchenBPH (benign prostatic hyperplasia) Will continue Flomax.   Marland Kitchen CODE STATUS: Full  code  [facility] 04/19/2011, 4:06 PM

## 2011-04-19 NOTE — ED Notes (Signed)
Pt returned from radiology, CBG obtained. Pt remains confused at times, however is no longer seeing things as previously stated.

## 2011-04-19 NOTE — ED Notes (Signed)
Pt transported to radiology for MRI studies. Pt has CBG of 54.  1 amp of D 50 pulled  From pixis.  Radiology notified.

## 2011-04-19 NOTE — ED Provider Notes (Signed)
History     CSN: [id number] Arrival date & time: 04/19/2011 11:55 AM   First MD Initiated Contact with Patient 04/19/11 1157      Chief Complaint  Patient presents with  . Fall  . Bleeding/Bruising    (Consider location/radiation/quality/duration/timing/severity/associated sxs/prior treatment) HPI  Past Medical History  Diagnosis Date  . Diabetes mellitus     No past surgical history on file.  No family history on file.  History  Substance Use Topics  . Smoking status: Not on file  . Smokeless tobacco: Not on file  . Alcohol Use:       Review of Systems  Allergies  Review of patient's allergies indicates no known allergies.  Home Medications  No current outpatient prescriptions on file.  BP 177/58  Pulse 97  Resp 22  SpO2 95%  Physical Exam  ED Course  Procedures (including critical care time)  Date: 04/19/2011  Rate: 97  Rhythm: normal sinus rhythm  QRS Axis: normal  Intervals: normal  ST/T Wave abnormalities: normal  Conduction Disutrbances:right bundle branch block  Narrative Interpretation:   Old EKG Reviewed: none available  Labs Reviewed  BASIC METABOLIC PANEL - Abnormal; Notable for the following:    Potassium 5.6 (*)    BUN 36 (*)    Creatinine, Ser 2.73 (*)    GFR calc non Af Amer 20 (*)    GFR calc Af Amer 23 (*)    All other components within normal limits  CBC - Abnormal; Notable for the following:    WBC 11.2 (*)    RBC 2.82 (*)    Hemoglobin 8.1 (*)    HCT 25.5 (*)    All other components within normal limits  DIFFERENTIAL - Abnormal; Notable for the following:    Neutrophils Relative 89 (*)    Neutro Abs 10.0 (*)    Lymphocytes Relative 5 (*)    Lymphs Abs 0.5 (*)    All other components within normal limits  CARDIAC PANEL(CRET KIN+CKTOT+MB+TROPI) - Abnormal; Notable for the following:    Total CK 663 (*)    All other components within normal limits  URINALYSIS, ROUTINE W REFLEX MICROSCOPIC   Ct Head Wo  Contrast  04/19/2011  *RADIOLOGY REPORT*  Clinical Data: Status post fall.  Laceration of forehead.  CT HEAD WITHOUT CONTRAST  Technique:  Contiguous axial images were obtained from the base of the skull through the vertex without contrast.  Comparison: Head CT scan 06/05/2007.  Findings: Laceration is seen over the right frontal bone.  No underlying fracture is identified.  The brain demonstrates mild cortical atrophy and some chronic microvascular ischemic change. No evidence of acute infarction, hemorrhage, mass lesion, mass effect, midline shift or abnormal extra-axial fluid collection.  No hydrocephalus or pneumocephalus.  IMPRESSION: Laceration over the right frontal bone.  No underlying fracture or acute intracranial abnormality.  Original Report Authenticated By: Bernadene Bell. Maricela Curet, M.D.     No diagnosis found.    MDM  Patient unable to care for self. See history. Will admit        Donnetta Hutching, MD 04/26/11 [phone]

## 2011-04-19 NOTE — ED Notes (Signed)
Pt brought in by [facility] for c/o fall; pt states he fell x 6 days ago; pt has multiple bruises to right arm and shoulder, pt has bruising and abrasion to left knee; pt has skin tear to left pinky; pt has abrasion to left forehead; pt is pale and has dry scaly lips; mucous membranes dry; pt clothing wet due to incontinence of urine

## 2011-04-20 ENCOUNTER — Encounter (HOSPITAL_COMMUNITY): Payer: Self-pay

## 2011-04-20 ENCOUNTER — Telehealth: Payer: Self-pay | Admitting: Orthopedic Surgery

## 2011-04-20 ENCOUNTER — Telehealth: Payer: Self-pay | Admitting: Gastroenterology

## 2011-04-20 DIAGNOSIS — I517 Cardiomegaly: Secondary | ICD-10-CM

## 2011-04-20 DIAGNOSIS — D649 Anemia, unspecified: Secondary | ICD-10-CM

## 2011-04-20 DIAGNOSIS — R195 Other fecal abnormalities: Secondary | ICD-10-CM

## 2011-04-20 LAB — CBC
HCT: 23.7 % — ABNORMAL LOW (ref 39.0–52.0)
Hemoglobin: 7.4 g/dL — ABNORMAL LOW (ref 13.0–17.0)
MCHC: 31.2 g/dL (ref 30.0–36.0)
WBC: 6.3 10*3/uL (ref 4.0–10.5)

## 2011-04-20 LAB — ABO/RH: ABO/RH(D): O POS

## 2011-04-20 LAB — LIPID PANEL
Cholesterol: 184 mg/dL (ref 0–200)
LDL Cholesterol: 97 mg/dL (ref 0–99)
Total CHOL/HDL Ratio: 3.3 RATIO
Triglycerides: 162 mg/dL — ABNORMAL HIGH (ref <150)
VLDL: 32 mg/dL (ref 0–40)

## 2011-04-20 LAB — IRON AND TIBC
Iron: <10 ug/dL — ABNORMAL LOW (ref 42–135)
UIBC: 237 ug/dL (ref 125–400)

## 2011-04-20 LAB — BASIC METABOLIC PANEL
Chloride: 112 mEq/L (ref 96–112)
GFR calc Af Amer: 28 mL/min — ABNORMAL LOW (ref >90)
GFR calc non Af Amer: 24 mL/min — ABNORMAL LOW (ref >90)
Potassium: 5.2 mEq/L — ABNORMAL HIGH (ref 3.5–5.1)
Sodium: 140 mEq/L (ref 135–145)

## 2011-04-20 LAB — PREPARE RBC (CROSSMATCH)

## 2011-04-20 LAB — FERRITIN: Ferritin: 258 ng/mL (ref 22–322)

## 2011-04-20 LAB — GLUCOSE, CAPILLARY
Glucose-Capillary: 87 mg/dL (ref 70–99)
Glucose-Capillary: 136 mg/dL — ABNORMAL HIGH (ref 70–99)

## 2011-04-20 LAB — HEMOGLOBIN A1C: Hgb A1c MFr Bld: 5.8 % — ABNORMAL HIGH (ref <5.7)

## 2011-04-20 MED ORDER — AMLODIPINE BESYLATE 5 MG PO TABS
10.0000 mg | ORAL_TABLET | Freq: Every day | ORAL | Status: DC
  Administered 2011-04-20 – 2011-04-26 (×7): 10 mg via ORAL
  Filled 2011-04-20 (×7): qty 2

## 2011-04-20 MED ORDER — PANTOPRAZOLE SODIUM 40 MG PO TBEC
40.0000 mg | DELAYED_RELEASE_TABLET | Freq: Every day | ORAL | Status: DC
  Administered 2011-04-21 – 2011-04-24 (×4): 40 mg via ORAL
  Filled 2011-04-20 (×6): qty 1

## 2011-04-20 MED ORDER — ACETAMINOPHEN 325 MG PO TABS
650.0000 mg | ORAL_TABLET | Freq: Once | ORAL | Status: DC
  Filled 2011-04-20: qty 2

## 2011-04-20 MED ORDER — FOLIC ACID 1 MG PO TABS
1.0000 mg | ORAL_TABLET | Freq: Every day | ORAL | Status: DC
  Administered 2011-04-20 – 2011-04-26 (×7): 1 mg via ORAL
  Filled 2011-04-20 (×7): qty 1

## 2011-04-20 MED ORDER — HYDRALAZINE HCL 20 MG/ML IJ SOLN: 10.0000 mg | Freq: Four times a day (QID) | INTRAMUSCULAR | Status: DC | PRN

## 2011-04-20 NOTE — Progress Notes (Signed)
Subjective: Right PROXIMAL HUMERUS FRACTURE   I WILL PROBABLY NOT GET IN THIS WEEKEND, BUT THE FRACTURE JUST NEEDS A SLING AND SWATHE   Objective: Vital signs in last 24 hours: Temp:  [97.8 F (36.6 C)-98.2 F (36.8 C)] 97.8 F (36.6 C) (10/26 0548) Pulse Rate:  [68-104] 68  (10/26 0548) Resp:  [15-23] 19  (10/26 0548) BP: (170-191)/(58-124) 185/92 mmHg (10/26 0548) SpO2:  [94 %-95 %] 94 % (10/26 0548) Weight:  [198 lb 10.2 oz (90.1 kg)] 198 lb 10.2 oz (90.1 kg) (10/25 2004)  Intake/Output from previous day: 10/25 0701 - 10/26 0700 In: -  Out: 625 [Urine:625] Intake/Output this shift: Total I/O In: 280 [P.O.:280] Out: -    Basename 04/20/11 0800 04/19/11 1245  HGB 7.4* 8.1*    Basename 04/20/11 0800 04/19/11 1245  WBC 6.3 11.2*  RBC 2.60* 2.82*  HCT 23.7* 25.5*  PLT 181 235    Basename 04/20/11 0800 04/19/11 1245  NA 140 138  K 5.2* 5.6*  CL 112 109  CO2 17* 20  BUN 38* 36*  CREATININE 2.34* 2.73*  GLUCOSE 91 81  CALCIUM 9.7 9.8   No results found for this basename: LABPT:2,INR:2 in the last 72 hours    Assessment/Plan    PROXIMAL HUMERUS FRACTURE NON DISPLACED   SLING AND SWATHE X 2 WEEKS THEN PHYSICAL THERAPY X 6 WEEKS   Fuller [place] 04/20/2011, 11:25 AM

## 2011-04-20 NOTE — Progress Notes (Signed)
Subjective: No major complaints. Wants to go home tomorrow.   Objective: Vital signs in last 24 hours: Temp:  [97.8 F (36.6 C)-98.2 F (36.8 C)] 97.8 F (36.6 C) (10/26 0548) Pulse Rate:  [68-104] 68  (10/26 0548) Resp:  [15-23] 19  (10/26 0548) BP: (170-191)/(58-124) 185/92 mmHg (10/26 0548) SpO2:  [94 %-95 %] 94 % (10/26 0548) Weight:  [90.1 kg (198 lb 10.2 oz)] 198 lb 10.2 oz (90.1 kg) (10/25 2004) Weight change:  Body mass index is 29.33 kg/(m^2).  Intake/Output from previous day: 10/25 0701 - 10/26 0700 In: -  Out: 625 [Urine:625]   PHYSICAL EXAM: Gen Exam: Awake and alert with with some amount of dysarthric speech.  Neck: Supple, No JVD.   Chest: B/L Clear.   CVS: S1 S2 Regular, no murmurs.  Abdomen: soft, BS +, non tender, non distended.  Extremities: no edema, warm.   Neurologic: Non Focal.   Skin: No Rash.   Wounds: N/A.    Lab Results:  Basename 04/20/11 0800 04/19/11 1245  WBC 6.3 11.2*  HGB 7.4* 8.1*  HCT 23.7* 25.5*  PLT 181 235   CMET CMP     Component Value Date/Time   NA 140 04/20/2011 0800   K 5.2* 04/20/2011 0800   CL 112 04/20/2011 0800   CO2 17* 04/20/2011 0800   GLUCOSE 91 04/20/2011 0800   BUN 38* 04/20/2011 0800   CREATININE 2.34* 04/20/2011 0800   CALCIUM 9.7 04/20/2011 0800   PROT 7.2 06/05/2007 0435   ALBUMIN 3.1* 06/05/2007 0435   AST 15 06/05/2007 0435   ALT 10 06/05/2007 0435   ALKPHOS 52 06/05/2007 0435   BILITOT 0.5 06/05/2007 0435   GFRNONAA 24* 04/20/2011 0800   GFRAA 28* 04/20/2011 0800    LIPIDS @LASTLIPID @ @LASTBNP @ COAGS No results found for this basename: PT:2,INR:2 in the last 72 hours  Studies/Results: Dg Chest 1 View  04/19/2011  *RADIOLOGY REPORT*  Clinical Data: 77 year old male with right shoulder pain and bruising.  Fall 2 days ago.  CHEST - 1 VIEW  Comparison: 06/05/2007.  Findings: AP semi upright portable view the chest at 1346 hours. Deformity of the proximal right humerus appears represent a  comminuted humeral head and surgical neck fracture.  There also appears to be heterotopic calcification about the right humeral head which is new from 2008.  Stable lung volumes.  Cardiac size and mediastinal contours are within normal limits.  No pneumothorax, pulmonary edema, pleural effusion or confluent pulmonary opacity.  IMPRESSION: 1.  Right proximal humerus deformity.  There also appears to be heterotopic ossification about the femoral head which is new from 2008.  See shoulder series. 2. No acute cardiopulmonary abnormality.  Original Report Authenticated By: Harley Hallmark, M.D.   Dg Shoulder Right  04/19/2011  *RADIOLOGY REPORT*  Clinical Data: Fall.  Right shoulder pain.  RIGHT SHOULDER - 2+ VIEW  Comparison: Plain film chest 06/05/2007.  Findings: The patient has an impacted surgical neck fracture of the humerus which which appears to involve the greater tuberosity.   No dislocation.  Acromioclavicular joint is intact with degenerative change noted.  Imaged right lung and ribs are unremarkable.  IMPRESSION: Impacted surgical neck fracture right humerus appears somewhat greater tuberosity.  Acromioclavicular osteoarthritis.  Original Report Authenticated By: Bernadene Bell. D'ALESSIO, M.D.   Dg Forearm Right  04/19/2011  *RADIOLOGY REPORT*  Clinical Data: Fall, pain.  RIGHT FOREARM - 2 VIEW  Comparison: None.  Findings: No acute bony or joint abnormality is identified.  The patient has old healed fractures of the distal radius and ulna.  IMPRESSION: No acute finding.  Old distal radius and ulnar fractures.  Original Report Authenticated By: Bernadene Bell. Maricela Curet, M.D.   Ct Head Wo Contrast  04/19/2011  *RADIOLOGY REPORT*  Clinical Data: Status post fall.  Laceration of forehead.  CT HEAD WITHOUT CONTRAST  Technique:  Contiguous axial images were obtained from the base of the skull through the vertex without contrast.  Comparison: Head CT scan 06/05/2007.  Findings: Laceration is seen over the right  frontal bone.  No underlying fracture is identified.  The brain demonstrates mild cortical atrophy and some chronic microvascular ischemic change. No evidence of acute infarction, hemorrhage, mass lesion, mass effect, midline shift or abnormal extra-axial fluid collection.  No hydrocephalus or pneumocephalus.  IMPRESSION: Laceration over the right frontal bone.  No underlying fracture or acute intracranial abnormality.  Original Report Authenticated By: Bernadene Bell. Maricela Curet, M.D.   Mr Angiogram Head Wo Contrast  04/20/2011  *RADIOLOGY REPORT*  Clinical Data:  77 year old male status post fall with weakness.  Comparison: Head CT 04/19/2011.  MRA 06/06/2007.  MRI HEAD WITHOUT CONTRAST  Technique: Multiplanar, multiecho pulse sequences of the brain and surrounding structures were obtained according to standard protocol without intravenous contrast.  Findings:  Symmetric decreased diffusion corresponding to the cortical spinal tract at the level of the corona radiata is without T2 were FLAIR hyperintensity and felt to be artifactual exaggeration of physiologic anisoptropy.  Diffusion signal elsewhere is within normal limits, no evidence of acute infarction.  Ventricular prominence appears to be on an ex vacuo basis.  Major intracranial vascular flow voids are preserved. No midline shift, mass effect, evidence of mass lesion, extra-axial collection or acute intracranial hemorrhage.  Cervicomedullary junction and pituitary are within normal limits.  Wallace Cullens and white matter signal is within normal limits for age throughout the brain.  Postoperative changes to the globes.  Minor paranasal sinus mucosal thickening.  Mastoids are clear. Visualized bone marrow signal is within normal limits.  Negative for age visualized cervical spine. Visualized scalp soft tissues are within normal limits.  IMPRESSION: 1. No acute intracranial abnormality. Suspect artifactual exaggeration of corticospinal tract anisoptropy on diffusion. 2.   Generalized volume loss. 3.  MRA findings are below.  MRA HEAD WITHOUT CONTRAST  Technique: Angiographic images of the Circle of Willis were obtained using MRA technique without  intravenous contrast.  Findings:  Study is moderately degraded by motion artifact despite repeated imaging attempts.  Antegrade flow in the posterior circulation.  Dominant distal right vertebral artery.  Vertebrobasilar junction and basilar artery are patent.  Bilateral AICA vessels are patent.  Superior cerebellar and PCA origins are patent.  Posterior communicating arteries are patent.  Fenestrated appearance of the right PCA re-identified. Preserved bilateral distal PCA flow.  Antegrade flow in both ICA siphons. No high-grade ICA stenosis. Stable carotid termini, MCA and ACA origins.  Distal bilateral MCA and ACA flow is grossly stable.  IMPRESSION: Degraded by motion, stable compared to the 2008 exam.  Original Report Authenticated By: Harley Hallmark, M.D.   Mr Brain Wo Contrast  04/20/2011  *RADIOLOGY REPORT*  Clinical Data:  77 year old male status post fall with weakness.  Comparison: Head CT 04/19/2011.  MRA 06/06/2007.  MRI HEAD WITHOUT CONTRAST  Technique: Multiplanar, multiecho pulse sequences of the brain and surrounding structures were obtained according to standard protocol without intravenous contrast.  Findings:  Symmetric decreased diffusion corresponding to the cortical spinal tract at the level  of the corona radiata is without T2 were FLAIR hyperintensity and felt to be artifactual exaggeration of physiologic anisoptropy.  Diffusion signal elsewhere is within normal limits, no evidence of acute infarction.  Ventricular prominence appears to be on an ex vacuo basis.  Major intracranial vascular flow voids are preserved. No midline shift, mass effect, evidence of mass lesion, extra-axial collection or acute intracranial hemorrhage.  Cervicomedullary junction and pituitary are within normal limits.  Wallace Cullens and white  matter signal is within normal limits for age throughout the brain.  Postoperative changes to the globes.  Minor paranasal sinus mucosal thickening.  Mastoids are clear. Visualized bone marrow signal is within normal limits.  Negative for age visualized cervical spine. Visualized scalp soft tissues are within normal limits.  IMPRESSION: 1. No acute intracranial abnormality. Suspect artifactual exaggeration of corticospinal tract anisoptropy on diffusion. 2.  Generalized volume loss. 3.  MRA findings are below.  MRA HEAD WITHOUT CONTRAST  Technique: Angiographic images of the Circle of Willis were obtained using MRA technique without  intravenous contrast.  Findings:  Study is moderately degraded by motion artifact despite repeated imaging attempts.  Antegrade flow in the posterior circulation.  Dominant distal right vertebral artery.  Vertebrobasilar junction and basilar artery are patent.  Bilateral AICA vessels are patent.  Superior cerebellar and PCA origins are patent.  Posterior communicating arteries are patent.  Fenestrated appearance of the right PCA re-identified. Preserved bilateral distal PCA flow.  Antegrade flow in both ICA siphons. No high-grade ICA stenosis. Stable carotid termini, MCA and ACA origins.  Distal bilateral MCA and ACA flow is grossly stable.  IMPRESSION: Degraded by motion, stable compared to the 2008 exam.  Original Report Authenticated By: Harley Hallmark, M.D.    Medications:  Scheduled:   . sodium chloride   Intravenous Once  . acetaminophen  650 mg Oral Once  . amLODipine  10 mg Oral Daily  . antiseptic oral rinse   Mouth Rinse TID PC  . aspirin EC  325 mg Oral Daily  . chlorhexidine  15 mL Mouth/Throat BID  . dextrose      . heparin  5,000 Units Subcutaneous Q8H  . influenza  inactive virus vaccine  0.5 mL Intramuscular Once  . insulin aspart  0-9 Units Subcutaneous TID WC  . metoprolol tartrate  100 mg Oral BID  . pneumococcal 23 valent vaccine  0.5 mL  Intramuscular Tomorrow-1000  . polyethylene glycol  17 g Oral Daily  . Tamsulosin HCl  0.4 mg Oral Daily  . DISCONTD: aspirin  300 mg Rectal Daily  . DISCONTD: insulin aspart  0-9 Units Subcutaneous TID WC  . DISCONTD: metoprolol tartrate  100 mg Oral BID  . DISCONTD: olmesartan  20 mg Oral Daily    Assessment/Plan: Principal Problem:  *Proximal humeral fracture -Appreciate orthopedic input. We'll continue with the sling.  Active Problems:  Acute renal failure -I think that this patient does have some amount of chronic disease, I have called Dr. Bonnita Levan office and have asked them to fax over his last progress note and last chemistries as well. For now I will increase his IV fluids to 75 cc an hour and stop his ACE inhibitor. We will continue to monitor  his chemistries and follow his clinical course.  Question of CVA :  -MRI of the brain is negative for acute CVA, given his anemia and a we will discontinue his aspirin.   Fall -Iit appears that this patient has had numerous frequent falls, PT has  seen the patient and have recommended skilled nursing facility. We will need to discuss with the patient's family and the patient as well.   DM (diabetes mellitus) -His CBGs are stable we will continue him on sliding scale insulin.   HTN (hypertension) -BP is still uncontrolled, given his renal failure we will stop his losartan and switch him over to amlodipine. I will write for as needed hydralazine to be used.   Anemia -His hemoglobin has dropped to 7.4 today, there is no overt evidence of blood loss however his fecal occult blood is positive. I think this drop is probably secondary to hemodilution from him receiving IV fluids. I will read and type and screen in and transfuse him if his hemoglobin is less than 7. I will also ask Dr. Sherril Croon his office to fax over his last CBC and his last colonoscopy results if they have it over there. I will also ask for GI to evaluate him for colonoscopy given  the fact that he does appear to be iron deficient. His folate levels are also on the lower side I will start supplementation. We will continue to monitor his H&H   Dementia -This is stable, appears to be at baseline.   BPH (benign prostatic hyperplasia) -I will continue him on Flomax.  Disposition  -Remain in the patient will likely need transfer to a skilled nursing facility.   Maretta Bees, MD. 04/20/2011, 11:35 AM
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
## 2011-04-20 NOTE — Telephone Encounter (Signed)
Per Tammy/Reeves Dr. Jerral Ralph request a consult on patient, [name] in room 340 for broken humerus.

## 2011-04-20 NOTE — Telephone Encounter (Signed)
Please schedule patient for f/u in two months: heme positive stool and anemia.  Primary GI: Dr. Darrick Penna.

## 2011-04-20 NOTE — Progress Notes (Signed)
*  PRELIMINARY RESULTS* Echocardiogram 2D Echocardiogram has been performed.  Conrad Dargan 04/20/2011, 2:18 PM

## 2011-04-20 NOTE — Consult Note (Addendum)
REVIEWED. AGREE. If recent TCS < 5 years, would only repeat EGD. ADD PPI QD. D/C HEPARIN. BIL SCDs. AWAIT REPORTS FROM [place].

## 2011-04-21 LAB — BASIC METABOLIC PANEL
CO2: 19 mEq/L (ref 19–32)
Calcium: 9.5 mg/dL (ref 8.4–10.5)
Creatinine, Ser: 1.93 mg/dL — ABNORMAL HIGH (ref 0.50–1.35)
GFR calc non Af Amer: 31 mL/min — ABNORMAL LOW (ref >90)
Glucose, Bld: 124 mg/dL — ABNORMAL HIGH (ref 70–99)
Sodium: 139 mEq/L (ref 135–145)

## 2011-04-21 LAB — CBC
MCH: 28.5 pg (ref 26.0–34.0)
MCHC: 30.8 g/dL (ref 30.0–36.0)
MCV: 92.3 fL (ref 78.0–100.0)
Platelets: 194 10*3/uL (ref 150–400)
RBC: 2.46 MIL/uL — ABNORMAL LOW (ref 4.22–5.81)

## 2011-04-21 LAB — GLUCOSE, CAPILLARY
Glucose-Capillary: 121 mg/dL — ABNORMAL HIGH (ref 70–99)
Glucose-Capillary: 98 mg/dL (ref 70–99)

## 2011-04-21 LAB — PREPARE RBC (CROSSMATCH)

## 2011-04-21 MED ORDER — FERROUS SULFATE 325 (65 FE) MG PO TABS
325.0000 mg | ORAL_TABLET | Freq: Two times a day (BID) | ORAL | Status: DC
  Administered 2011-04-21 – 2011-04-23 (×4): 325 mg via ORAL
  Filled 2011-04-21 (×4): qty 1

## 2011-04-21 MED ORDER — LUBIPROSTONE 24 MCG PO CAPS
24.0000 ug | ORAL_CAPSULE | Freq: Every day | ORAL | Status: DC | PRN
  Filled 2011-04-21: qty 1

## 2011-04-21 MED ORDER — DIPHENHYDRAMINE HCL 25 MG PO CAPS
25.0000 mg | ORAL_CAPSULE | Freq: Once | ORAL | Status: AC
  Administered 2011-04-21: 25 mg via ORAL
  Filled 2011-04-21: qty 1

## 2011-04-21 MED ORDER — ACETAMINOPHEN 325 MG PO TABS
650.0000 mg | ORAL_TABLET | Freq: Once | ORAL | Status: AC
  Administered 2011-04-21: 650 mg via ORAL

## 2011-04-21 NOTE — Progress Notes (Signed)
Subjective: No major complaints. Very hard of hearing. Speech seems to be much care today.  Objective: Vital signs in last 24 hours: Temp:  [97.3 F (36.3 C)-99 F (37.2 C)] 98.2 F (36.8 C) (10/27 1045) Pulse Rate:  [74-85] 85  (10/27 1045) Resp:  [18-20] 20  (10/27 1045) BP: (154-202)/(61-81) 202/81 mmHg (10/27 1045) SpO2:  [95 %-97 %] 97 % (10/27 0600) Weight:  [93 kg (205 lb 0.4 oz)] 205 lb 0.4 oz (93 kg) (10/27 0600) Weight change: 2.9 kg (6 lb 6.3 oz) Body mass index is 30.28 kg/(m^2).  Intake/Output from previous day: 10/26 0701 - 10/27 0700 In: 1938.8 [P.O.:620; I.V.:1318.8] Out: 675 [Urine:675]   PHYSICAL EXAM: Gen Exam: Awake and alert, speech seems much more clear today. He is very hard of hearing but generally follows all my commands.  CVS: S1 S2 Regular, no murmurs.  Abdomen: soft, BS +, non tender, non distended.  Extremities: no edema, warm.   Neurologic: Non Focal.   Skin: No Rash.   Wounds: N/A.    Lab Results:  Basename 04/21/11 0444 04/20/11 0800  WBC 4.4 6.3  HGB 7.0* 7.4*  HCT 22.7* 23.7*  PLT 194 181   CMET CMP     Component Value Date/Time   NA 139 04/21/2011 0444   K 5.3* 04/21/2011 0444   CL 113* 04/21/2011 0444   CO2 19 04/21/2011 0444   GLUCOSE 124* 04/21/2011 0444   BUN 34* 04/21/2011 0444   CREATININE 1.93* 04/21/2011 0444   CALCIUM 9.5 04/21/2011 0444   PROT 7.2 06/05/2007 0435   ALBUMIN 3.1* 06/05/2007 0435   AST 15 06/05/2007 0435   ALT 10 06/05/2007 0435   ALKPHOS 52 06/05/2007 0435   BILITOT 0.5 06/05/2007 0435   GFRNONAA 31* 04/21/2011 0444   GFRAA 36* 04/21/2011 0444    LIPIDS @LASTLIPID @ @LASTBNP @ COAGS No results found for this basename: PT:2,INR:2 in the last 72 hours  Studies/Results: Dg Chest 1 View  04/19/2011  *RADIOLOGY REPORT*  Clinical Data: 77 year old male with right shoulder pain and bruising.  Fall 2 days ago.  CHEST - 1 VIEW  Comparison: 06/05/2007.  Findings: AP semi upright portable view the  chest at 1346 hours. Deformity of the proximal right humerus appears represent a comminuted humeral head and surgical neck fracture.  There also appears to be heterotopic calcification about the right humeral head which is new from 2008.  Stable lung volumes.  Cardiac size and mediastinal contours are within normal limits.  No pneumothorax, pulmonary edema, pleural effusion or confluent pulmonary opacity.  IMPRESSION: 1.  Right proximal humerus deformity.  There also appears to be heterotopic ossification about the femoral head which is new from 2008.  See shoulder series. 2. No acute cardiopulmonary abnormality.  Original Report Authenticated By: Harley Hallmark, M.D.   Dg Shoulder Right  04/19/2011  *RADIOLOGY REPORT*  Clinical Data: Fall.  Right shoulder pain.  RIGHT SHOULDER - 2+ VIEW  Comparison: Plain film chest 06/05/2007.  Findings: The patient has an impacted surgical neck fracture of the humerus which which appears to involve the greater tuberosity.   No dislocation.  Acromioclavicular joint is intact with degenerative change noted.  Imaged right lung and ribs are unremarkable.  IMPRESSION: Impacted surgical neck fracture right humerus appears somewhat greater tuberosity.  Acromioclavicular osteoarthritis.  Original Report Authenticated By: Bernadene Bell. D'ALESSIO, M.D.   Dg Forearm Right  04/19/2011  *RADIOLOGY REPORT*  Clinical Data: Fall, pain.  RIGHT FOREARM - 2 VIEW  Comparison: None.  Findings: No acute bony or joint abnormality is identified.  The patient has old healed fractures of the distal radius and ulna.  IMPRESSION: No acute finding.  Old distal radius and ulnar fractures.  Original Report Authenticated By: Bernadene Bell. Maricela Curet, M.D.   Ct Head Wo Contrast  04/19/2011  *RADIOLOGY REPORT*  Clinical Data: Status post fall.  Laceration of forehead.  CT HEAD WITHOUT CONTRAST  Technique:  Contiguous axial images were obtained from the base of the skull through the vertex without contrast.   Comparison: Head CT scan 06/05/2007.  Findings: Laceration is seen over the right frontal bone.  No underlying fracture is identified.  The brain demonstrates mild cortical atrophy and some chronic microvascular ischemic change. No evidence of acute infarction, hemorrhage, mass lesion, mass effect, midline shift or abnormal extra-axial fluid collection.  No hydrocephalus or pneumocephalus.  IMPRESSION: Laceration over the right frontal bone.  No underlying fracture or acute intracranial abnormality.  Original Report Authenticated By: Bernadene Bell. Maricela Curet, M.D.   Mr Angiogram Head Wo Contrast  04/20/2011  *RADIOLOGY REPORT*  Clinical Data:  77 year old male status post fall with weakness.  Comparison: Head CT 04/19/2011.  MRA 06/06/2007.  MRI HEAD WITHOUT CONTRAST  Technique: Multiplanar, multiecho pulse sequences of the brain and surrounding structures were obtained according to standard protocol without intravenous contrast.  Findings:  Symmetric decreased diffusion corresponding to the cortical spinal tract at the level of the corona radiata is without T2 were FLAIR hyperintensity and felt to be artifactual exaggeration of physiologic anisoptropy.  Diffusion signal elsewhere is within normal limits, no evidence of acute infarction.  Ventricular prominence appears to be on an ex vacuo basis.  Major intracranial vascular flow voids are preserved. No midline shift, mass effect, evidence of mass lesion, extra-axial collection or acute intracranial hemorrhage.  Cervicomedullary junction and pituitary are within normal limits.  Wallace Cullens and white matter signal is within normal limits for age throughout the brain.  Postoperative changes to the globes.  Minor paranasal sinus mucosal thickening.  Mastoids are clear. Visualized bone marrow signal is within normal limits.  Negative for age visualized cervical spine. Visualized scalp soft tissues are within normal limits.  IMPRESSION: 1. No acute intracranial abnormality.  Suspect artifactual exaggeration of corticospinal tract anisoptropy on diffusion. 2.  Generalized volume loss. 3.  MRA findings are below.  MRA HEAD WITHOUT CONTRAST  Technique: Angiographic images of the Circle of Willis were obtained using MRA technique without  intravenous contrast.  Findings:  Study is moderately degraded by motion artifact despite repeated imaging attempts.  Antegrade flow in the posterior circulation.  Dominant distal right vertebral artery.  Vertebrobasilar junction and basilar artery are patent.  Bilateral AICA vessels are patent.  Superior cerebellar and PCA origins are patent.  Posterior communicating arteries are patent.  Fenestrated appearance of the right PCA re-identified. Preserved bilateral distal PCA flow.  Antegrade flow in both ICA siphons. No high-grade ICA stenosis. Stable carotid termini, MCA and ACA origins.  Distal bilateral MCA and ACA flow is grossly stable.  IMPRESSION: Degraded by motion, stable compared to the 2008 exam.  Original Report Authenticated By: Harley Hallmark, M.D.   Mr Brain Wo Contrast  04/20/2011  *RADIOLOGY REPORT*  Clinical Data:  77 year old male status post fall with weakness.  Comparison: Head CT 04/19/2011.  MRA 06/06/2007.  MRI HEAD WITHOUT CONTRAST  Technique: Multiplanar, multiecho pulse sequences of the brain and surrounding structures were obtained according to standard protocol without intravenous contrast.  Findings:  Symmetric decreased  diffusion corresponding to the cortical spinal tract at the level of the corona radiata is without T2 were FLAIR hyperintensity and felt to be artifactual exaggeration of physiologic anisoptropy.  Diffusion signal elsewhere is within normal limits, no evidence of acute infarction.  Ventricular prominence appears to be on an ex vacuo basis.  Major intracranial vascular flow voids are preserved. No midline shift, mass effect, evidence of mass lesion, extra-axial collection or acute intracranial hemorrhage.   Cervicomedullary junction and pituitary are within normal limits.  Wallace Cullens and white matter signal is within normal limits for age throughout the brain.  Postoperative changes to the globes.  Minor paranasal sinus mucosal thickening.  Mastoids are clear. Visualized bone marrow signal is within normal limits.  Negative for age visualized cervical spine. Visualized scalp soft tissues are within normal limits.  IMPRESSION: 1. No acute intracranial abnormality. Suspect artifactual exaggeration of corticospinal tract anisoptropy on diffusion. 2.  Generalized volume loss. 3.  MRA findings are below.  MRA HEAD WITHOUT CONTRAST  Technique: Angiographic images of the Circle of Willis were obtained using MRA technique without  intravenous contrast.  Findings:  Study is moderately degraded by motion artifact despite repeated imaging attempts.  Antegrade flow in the posterior circulation.  Dominant distal right vertebral artery.  Vertebrobasilar junction and basilar artery are patent.  Bilateral AICA vessels are patent.  Superior cerebellar and PCA origins are patent.  Posterior communicating arteries are patent.  Fenestrated appearance of the right PCA re-identified. Preserved bilateral distal PCA flow.  Antegrade flow in both ICA siphons. No high-grade ICA stenosis. Stable carotid termini, MCA and ACA origins.  Distal bilateral MCA and ACA flow is grossly stable.  IMPRESSION: Degraded by motion, stable compared to the 2008 exam.  Original Report Authenticated By: Harley Hallmark, M.D.    Medications:  Scheduled:    . acetaminophen  650 mg Oral Once  . acetaminophen  650 mg Oral Once  . amLODipine  10 mg Oral Daily  . antiseptic oral rinse   Mouth Rinse TID PC  . chlorhexidine  15 mL Mouth/Throat BID  . diphenhydrAMINE  25 mg Oral Once  . folic acid  1 mg Oral Daily  . insulin aspart  0-9 Units Subcutaneous TID WC  . metoprolol tartrate  100 mg Oral BID  . pantoprazole  40 mg Oral QAC breakfast  . polyethylene  glycol  17 g Oral Daily  . Tamsulosin HCl  0.4 mg Oral Daily  . DISCONTD: heparin  5,000 Units Subcutaneous Q8H    Assessment/Plan: Principal Problem:  *Proximal humeral fracture -Appreciate orthopedic input. We'll continue with the sling.  Active Problems: -) Acute renal failure -I think that this patient does have some amount of chronic disease, review of his old records does confirm that he does have some amount of chronic kidney disease at baseline (CK D. stage II or 3), last creatinine was 1.55. -His creatinine has shown improvement and has decreased to about 1.93, he seems euvolemic to me on exam and as a result I would discontinue his IV fluids as well. -I will continue to hold off on resuming his Ace inhibitor. -Recheck chemistries in the morning.   -)Anemia: -Again there is no overt evidence of bleeding. However FOBT is positive. -GI input has been appreciated. -He continues to be on folate supplementation, I will begin him on iron supplementation as well. -His hemoglobin has dropped to 7.0 today, we will go ahead and transfuse patient 2 units of PRBC today. -EGD/colonoscopy decision will  be now deferred to gastroenterology. -Recheck CBC in the morning.  -)Question of CVA :  -MRI of the brain is negative for acute CVA, given his anemia aspirin has been discontinued. - 2-D echocardiogram has been reviewed.  -) Fall -Iit appears that this patient has had numerous frequent falls, PT has seen the patient and have recommended skilled nursing facility. -Family and patient are now agreeable to transfer to skilled nursing facility when patient ready.  -) DM (diabetes mellitus) -His CBGs are stable we will continue him on sliding scale insulin. -His HbA1c is 5.7, given his frailty and elderly age, we probably should hold off on resuming his glipizide. At this point perhaps some permissive hyper glycemia can be tolerated.   HTN (hypertension) -BP is still uncontrolled, given his  renal failure ACE inhibitor was stopped. -He currently is on amlodipine and Lopressor. -I will add some scheduled hydralazine, and adjust anti-hypertensive medications tomorrow depending upon his blood pressure readings.    Dementia -This is stable, appears to be at baseline.   BPH (benign prostatic hyperplasia) -I will continue him on Flomax.  Disposition  -Remain in the patient will likely need transfer to a skilled nursing facility.   Maretta Bees, MD. 04/21/2011, 11:52 AM

## 2011-04-22 DIAGNOSIS — R195 Other fecal abnormalities: Secondary | ICD-10-CM

## 2011-04-22 DIAGNOSIS — D649 Anemia, unspecified: Secondary | ICD-10-CM

## 2011-04-22 LAB — CBC
Hemoglobin: 9.5 g/dL — ABNORMAL LOW (ref 13.0–17.0)
Platelets: 191 10*3/uL (ref 150–400)
RBC: 3.26 MIL/uL — ABNORMAL LOW (ref 4.22–5.81)
WBC: 5 10*3/uL (ref 4.0–10.5)

## 2011-04-22 LAB — GLUCOSE, CAPILLARY
Glucose-Capillary: 103 mg/dL — ABNORMAL HIGH (ref 70–99)
Glucose-Capillary: 164 mg/dL — ABNORMAL HIGH (ref 70–99)

## 2011-04-22 LAB — BASIC METABOLIC PANEL
Calcium: 9.3 mg/dL (ref 8.4–10.5)
GFR calc non Af Amer: 35 mL/min — ABNORMAL LOW (ref >90)
Potassium: 5.2 mEq/L — ABNORMAL HIGH (ref 3.5–5.1)
Sodium: 139 mEq/L (ref 135–145)

## 2011-04-22 NOTE — Progress Notes (Addendum)
Subjective:  No outside records as of yet. Patient denies any abdominal pain, nausea vomiting, melena or hematochezia Hemoglobin up to 9.5 from 7.0 after a 2 unit transfusion of packed red blood cells   Objective: Vital signs in last 24 hours: Temp:  [97.8 F (36.6 C)-99.3 F (37.4 C)] 98.4 F (36.9 C) (10/28 0627) Pulse Rate:  [63-77] 63  (10/28 0627) Resp:  [18-20] 20  (10/28 0627) BP: (134-187)/(67-81) 139/69 mmHg (10/28 0627) SpO2:  [95 %-98 %] 95 % (10/28 0627) Weight:  [208 lb 1.8 oz (94.4 kg)] 208 lb 1.8 oz (94.4 kg) (10/28 0627) Last BM Date: 04/21/11 General:   Alert,   pleasant and cooperative in NAD Abdomen:  Soft, nontender and nondistended.  Normal bowel sounds, without guarding, and without rebound.  No mass or organomegaly. Extremities:  Without clubbing or edema.    Intake/Output from previous day: 10/27 0701 - 10/28 0700 In: 1603.8 [P.O.:360; I.V.:500; Blood:743.8] Out: 1400 [Urine:1400] Intake/Output this shift: Total I/O In: 240 [P.O.:240] Out: 325 [Urine:325]  Lab Results:  Basename 04/22/11 0432 04/21/11 0444 04/20/11 0800  WBC 5.0 4.4 6.3  HGB 9.5* 7.0* 7.4*  HCT 29.8* 22.7* 23.7*  PLT 191 194 181   BMET  Basename 04/22/11 0432 04/21/11 0444 04/20/11 0800  NA 139 139 140  K 5.2* 5.3* 5.2*  CL 114* 113* 112  CO2 19 19 17*  GLUCOSE 115* 124* 91  BUN 30* 34* 38*  CREATININE 1.75* 1.93* 2.34*  CALCIUM 9.3 9.5 9.7    Studies/Results: No results found.  Assessment: Principal Problem:  *Proximal humeral fracture Active Problems:  Acute renal failure  Fall  DM (diabetes mellitus)  HTN (hypertension)  Anemia  Dementia  BPH (benign prostatic hyperplasia)   Anemia/Hemoccult positive. Status post transfusion. No overt GI bleed.    Recommendations: Anticipate review of outside records when they become available. Continued observation. Continue PPI.   LOS: 3 days   Eula Listen  04/22/2011, 10:57 AM

## 2011-04-22 NOTE — Progress Notes (Signed)
Subjective: Patient denies any symptoms today. Daughter present at bedside.  Objective:  Vital signs in last 24 hours:  Filed Vitals:   04/21/11 1800 04/21/11 2200 04/22/11 0200 04/22/11 0627  BP: 166/72 164/69 161/67 139/69  Pulse: 76 77 71 63  Temp: 98 F (36.7 C) 98.8 F (37.1 C) 99.3 F (37.4 C) 98.4 F (36.9 C)  TempSrc: Oral Oral Oral Oral  Resp: 20 18 20 20   Height:      Weight:    94.4 kg (208 lb 1.8 oz)  SpO2: 97% 98% 98% 95%    Intake/Output from previous day:   Intake/Output Summary (Last 24 hours) at 04/22/11 1612 Last data filed at 04/22/11 1000  Gross per 24 hour  Intake    480 ml  Output    925 ml  Net   -445 ml    Physical Exam:  General:  elderly male lying in bed  in no acute distress. HEENT: no pallor, no icterus, moist oral mucosa, no JVD, no lymphadenopathy Heart: Normal  s1 &s2  Regular rate and rhythm, without murmurs, rubs, gallops. Lungs: Clear to auscultation bilaterally. Abdomen: Soft, nontender, nondistended, positive bowel sounds. Extremities: sling over rt arm, no edema Neuro: Alert, awake, oriented x2, nonfocal.   Lab Results:  Basic Metabolic Panel:    Component Value Date/Time   NA 139 04/22/2011 0432   K 5.2* 04/22/2011 0432   CL 114* 04/22/2011 0432   CO2 19 04/22/2011 0432   BUN 30* 04/22/2011 0432   CREATININE 1.75* 04/22/2011 0432   GLUCOSE 115* 04/22/2011 0432   CALCIUM 9.3 04/22/2011 0432   CBC:    Component Value Date/Time   WBC 5.0 04/22/2011 0432   HGB 9.5* 04/22/2011 0432   HCT 29.8* 04/22/2011 0432   PLT 191 04/22/2011 0432   MCV 91.4 04/22/2011 0432   NEUTROABS 10.0* 04/19/2011 1245   LYMPHSABS 0.5* 04/19/2011 1245   MONOABS 0.7 04/19/2011 1245   EOSABS 0.0 04/19/2011 1245   BASOSABS 0.0 04/19/2011 1245    No results found for this or any previous visit (from the past 240 hour(s)).  Studies/Results: No results found.  Medications: Scheduled Meds:   . acetaminophen  650 mg Oral Once  .  amLODipine  10 mg Oral Daily  . antiseptic oral rinse   Mouth Rinse TID PC  . chlorhexidine  15 mL Mouth/Throat BID  . ferrous sulfate  325 mg Oral BID WC  . folic acid  1 mg Oral Daily  . insulin aspart  0-9 Units Subcutaneous TID WC  . metoprolol tartrate  100 mg Oral BID  . pantoprazole  40 mg Oral QAC breakfast  . polyethylene glycol  17 g Oral Daily  . Tamsulosin HCl  0.4 mg Oral Daily   Continuous Infusions:  PRN Meds:.acetaminophen, acetaminophen, albuterol, alum & mag hydroxide-simeth, guaiFENesin-dextromethorphan, hydrALAZINE, HYDROcodone-acetaminophen, lidocaine, lubiprostone, morphine, ondansetron (ZOFRAN) IV, ondansetron, senna, senna-docusate, zolpidem  Assessment 16 male with hx of HTN, DM, ? CKD , BPH  mild dementia admitted with fall with  Proximal humeral fracture. patient also noted to be anemic.    PLAN:  ACTIVE ISSUES:   *Proximal humeral fracture Seen by orthopedics and recommended sling with PT patient currently tolerating  Anemia Patient noted to have drop in h &H which improved with 2 u PRBC. Hemoccult positive. Seen by GI who plan on EGD/ c scope once outside records available. i will cal his PCP Dr [name] in [place] tomorrow ( phone [phone]) to check  for previous GI procedures. As per daughter she doesnot remember him having any such procedure. monitor h &H Cont PPI Cont iron tablets and folic acid   Acute renal failure Appears to have underlying CKD, renal fn now improving Cont to hold ACEi  Frequent falls  as per daughter he has been having frequent falls for past few months' head CT and MRI negative for CVA ASA deced given low h &H with  ? Gi bleed -rehab as per PT  Cont fall precautions  Unstable HTN  cont amlod and metoprolol Cont prn hydralazine  ACEi dced to to AKI  CHRONIC ISSUES:  DM (diabetes mellitus) Stable ' cont current meds   BPH  cont flomax    Dementia At baseline and stable  Contact info: [name] ( daughter) [phone]   LOS: 3 days   [name] 04/22/2011, 4:12 PM

## 2011-04-23 ENCOUNTER — Encounter (HOSPITAL_COMMUNITY): Payer: Self-pay | Admitting: Urgent Care

## 2011-04-23 ENCOUNTER — Telehealth: Payer: Self-pay | Admitting: Urgent Care

## 2011-04-23 DIAGNOSIS — Z86010 Personal history of colonic polyps: Secondary | ICD-10-CM

## 2011-04-23 DIAGNOSIS — D649 Anemia, unspecified: Secondary | ICD-10-CM

## 2011-04-23 DIAGNOSIS — R195 Other fecal abnormalities: Secondary | ICD-10-CM

## 2011-04-23 LAB — BASIC METABOLIC PANEL
BUN: 28 mg/dL — ABNORMAL HIGH (ref 6–23)
Creatinine, Ser: 1.52 mg/dL — ABNORMAL HIGH (ref 0.50–1.35)
GFR calc Af Amer: 47 mL/min — ABNORMAL LOW (ref >90)
GFR calc non Af Amer: 41 mL/min — ABNORMAL LOW (ref >90)
Glucose, Bld: 108 mg/dL — ABNORMAL HIGH (ref 70–99)
Potassium: 5.1 mEq/L (ref 3.5–5.1)

## 2011-04-23 LAB — CBC
HCT: 30.1 % — ABNORMAL LOW (ref 39.0–52.0)
Hemoglobin: 9.7 g/dL — ABNORMAL LOW (ref 13.0–17.0)
MCHC: 32.2 g/dL (ref 30.0–36.0)
MCV: 91.5 fL (ref 78.0–100.0)
RDW: 14.6 % (ref 11.5–15.5)

## 2011-04-23 LAB — GLUCOSE, CAPILLARY
Glucose-Capillary: 137 mg/dL — ABNORMAL HIGH (ref 70–99)
Glucose-Capillary: 151 mg/dL — ABNORMAL HIGH (ref 70–99)

## 2011-04-23 MED ORDER — PEG 3350-KCL-NABCB-NACL-NASULF 236 G PO SOLR
2000.0000 mL | Freq: Once | ORAL | Status: DC
  Filled 2011-04-23: qty 4000

## 2011-04-23 MED ORDER — BISACODYL 5 MG PO TBEC: 5.0000 mg | DELAYED_RELEASE_TABLET | Freq: Every day | ORAL | Status: DC | PRN

## 2011-04-23 MED ORDER — PEG 3350-KCL-NABCB-NACL-NASULF 236 G PO SOLR
2000.0000 mL | Freq: Once | ORAL | Status: AC
  Administered 2011-04-23: 2000 mL via ORAL
  Filled 2011-04-23: qty 4000

## 2011-04-23 MED ORDER — BISACODYL 5 MG PO TBEC
5.0000 mg | DELAYED_RELEASE_TABLET | ORAL | Status: AC
  Administered 2011-04-23 (×2): 5 mg via ORAL
  Filled 2011-04-23 (×2): qty 1

## 2011-04-23 NOTE — Progress Notes (Signed)
Occupational Therapy Evaluation Patient Details Name: [name] MRN: [id number] DOB: 1927/03/19 Today's Date: 04/23/2011  OT Assessment/Plan OT Assessment/Plan OT Frequency: Min 2X/week OT Goals Acute Rehab OT Goals OT Goal Formulation: With patient Time For Goal Achievement: 2 weeks ADL Goals Pt Will Perform Grooming: with supervision;Sitting, chair ADL Goal: Grooming - Progress: Met (observed him doing this with CNA) Pt Will Perform Upper Body Dressing: with supervision;Sitting, chair ADL Goal: Upper Body Dressing - Progress: Progressing toward goals Arm Goals Pt Will Perform AROM: with supervision, verbal cues required/provided;to maintain range of motion;Right upper extremity (elbow, forearm, wrist, and hand AROM) Arm Goal: AROM - Progress: Progressing toward goal  OT Treatment Exercises Shoulder Exercises Elbow Extension: PROM;AROM;10 reps;Right Wrist Flexion: PROM;AROM;10 reps;Right Wrist Extension: PROM;AROM;10 reps;Right Digit Composite Flexion: PROM;AROM;10 reps;Right Composite Extension: AROM;10 reps;Right  End of Session OT - End of Session Activity Tolerance: Patient limited by fatigue Patient left: in bed;with call bell in reach;Other (comment) (and right arm sling in place) General Behavior During Session: Lethargic Cognition: WFL for tasks performed  [name], COTA/L  04/23/2011, 1:35 PM

## 2011-04-23 NOTE — Progress Notes (Signed)
Physical Therapy Treatment Patient Details Name: Tion Tse Campa MRN: [id number] DOB: [birth year] Today's Date: 04/23/2011  PT Assessment/Plan  Pt refused to participate in PT due to just finishing bath with CNA and working with OT. Will attempt when more appropriate PT Goals     PT Treatment Precautions/Restrictions  Precautions Precautions: Fall;Shoulder Type of Shoulder Precautions: Right shoulder in sling Required Braces or Orthoses: Yes Other Brace/Splint: sling for RUE Restrictions Weight Bearing Restrictions: No Mobility (including Balance)      Exercise    End of Session    Kire Ferg ATKINSO 04/23/2011, 12:03 PM

## 2011-04-23 NOTE — Telephone Encounter (Signed)
Reminder in epic to follow up in 2 months 

## 2011-04-23 NOTE — Progress Notes (Signed)
Subjective: Pt denies any abdominal pain, rectal bleeding, melena, nausea or vomiting.  Daughter does not think pt has had colonoscopy, but records from 2006 Dr Jarold Motto show EGD/colonscopy w/ colonic AVM, polypectomies, hiatal hernia & Barrett's.    Objective: Vital signs in last 24 hours: Temp:  [97.2 F (36.2 C)-98.4 F (36.9 C)] 98.2 F (36.8 C) (10/29 0553) Pulse Rate:  [65-78] 71  (10/29 0553) Resp:  [18-20] 20  (10/29 0553) BP: (131-159)/(68-73) 147/73 mmHg (10/29 0553) SpO2:  [95 %-97 %] 95 % (10/29 0553) Weight:  [212 lb 8.4 oz (96.4 kg)] 212 lb 8.4 oz (96.4 kg) (10/29 0553) Last BM Date: 04/21/11 General:   Alert,  Well-developed, well-nourished, pleasant and cooperative in NAD Head:  Normocephalic and atraumatic. Eyes:  Sclera clear, no icterus.   Conjunctiva pink. Mouth:  No deformity or lesions, OP pink/moist. Neck:  Supple; no masses or thyromegaly. Heart:  Regular rate and rhythm; no murmurs, clicks, rubs,  or gallops. Abdomen:  Soft, nontender and nondistended. No masses, hepatosplenomegaly or hernias noted. Normal bowel sounds, without guarding, and without rebound.   Msk:  Symmetrical without gross deformities. Normal posture. Pulses:  Normal pulses noted. Extremities:  With trace bilat lower extremity edema. Neurologic:  Alert and  oriented x2;  grossly normal neurologically. Skin:  Intact without significant lesions or rashes. Cervical Nodes:  No significant cervical adenopathy. Psych:  Alert and cooperative. Normal mood and affect.  Intake/Output from previous day: 10/28 0701 - 10/29 0700 In: 840 [P.O.:840] Out: 1225 [Urine:1225]  Lab Results:  Basename 04/23/11 0421 04/22/11 0432 04/21/11 0444  WBC 4.6 5.0 4.4  HGB 9.7* 9.5* 7.0*  HCT 30.1* 29.8* 22.7*  PLT 180 191 194   BMET  Basename 04/23/11 0421 04/22/11 0432 04/21/11 0444  NA 136 139 139  K 5.1 5.2* 5.3*  CL 110 114* 113*  CO2 21 19 19   GLUCOSE 108* 115* 124*  BUN 28* 30* 34*  CREATININE  1.52* 1.75* 1.93*  CALCIUM 9.7 9.3 9.5   Assessment: 1) Anemia mixed w/ iron deficiency & anemia of chronic disease: Stable 2) Hemoccult positive stool:  No gross GI bleeding 3) Barrett's esophagus 4) Hx colon polyps  Plan: 1) Colonoscopy & EGD 10/30 w/ Dr Darrick Penna.  I have discussed risks & benefits w/ pt's daughter, [name], which include, but are not limited to, bleeding, infection, perforation & drug reaction.  She agrees with this plan & written consent will be obtained.    2) Continue PPI   LOS: 4 days   Lorenza Burton  04/23/2011, 9:42 AM

## 2011-04-23 NOTE — Progress Notes (Signed)
CSW updated pt's daughter on progress with SNF.  Pt's information has been faxed to facilities and PT notes also submitted for [facility] authorization.  She also had questions regarding Medicaid and CSW referred her to DSS.  CSW will follow up with bed offers when available.    Karn Cassis

## 2011-04-23 NOTE — Progress Notes (Signed)
Subjective: Pt seen and examined this am. Denies any symptoms.   Objective:  Vital signs in last 24 hours:  Filed Vitals:   04/22/11 1400 04/22/11 1800 04/23/11 0211 04/23/11 0553  BP: 148/69 131/71 159/71 147/73  Pulse: 71 78 65 71  Temp: 98.2 F (36.8 C) 97.2 F (36.2 C) 98.4 F (36.9 C) 98.2 F (36.8 C)  TempSrc:   Oral Oral  Resp: 20 20 18 20   Height:      Weight:    96.4 kg (212 lb 8.4 oz)  SpO2: 96% 97% 96% 95%    Intake/Output from previous day:   Intake/Output Summary (Last 24 hours) at 04/23/11 1129 Last data filed at 04/23/11 0740  Gross per 24 hour  Intake    600 ml  Output   1100 ml  Net   -500 ml    Physical Exam:  General:  Elderly male lying in bed  in no acute distress. HEENT: no pallor, no icterus, moist oral mucosa, no JVD, no lymphadenopathy Heart: Normal  s1 &s2  Regular rate and rhythm, without murmurs, rubs, gallops. Lungs: Clear to auscultation bilaterally. Abdomen: Soft, nontender, nondistended, positive bowel sounds. Extremities: No clubbing cyanosis or edema with positive pedal pulses. Sling over rt arm Neuro: Alert, awake, oriented x2, nonfocal.   Lab Results:  Basic Metabolic Panel:    Component Value Date/Time   NA 136 04/23/2011 0421   K 5.1 04/23/2011 0421   CL 110 04/23/2011 0421   CO2 21 04/23/2011 0421   BUN 28* 04/23/2011 0421   CREATININE 1.52* 04/23/2011 0421   GLUCOSE 108* 04/23/2011 0421   CALCIUM 9.7 04/23/2011 0421   CBC:    Component Value Date/Time   WBC 4.6 04/23/2011 0421   HGB 9.7* 04/23/2011 0421   HCT 30.1* 04/23/2011 0421   PLT 180 04/23/2011 0421   MCV 91.5 04/23/2011 0421   NEUTROABS 10.0* 04/19/2011 1245   LYMPHSABS 0.5* 04/19/2011 1245   MONOABS 0.7 04/19/2011 1245   EOSABS 0.0 04/19/2011 1245   BASOSABS 0.0 04/19/2011 1245    No results found for this or any previous visit (from the past 240 hour(s)).  Studies/Results: No results found.  Medications: Scheduled Meds:   .  acetaminophen  650 mg Oral Once  . amLODipine  10 mg Oral Daily  . antiseptic oral rinse   Mouth Rinse TID PC  . chlorhexidine  15 mL Mouth/Throat BID  . ferrous sulfate  325 mg Oral BID WC  . folic acid  1 mg Oral Daily  . insulin aspart  0-9 Units Subcutaneous TID WC  . metoprolol tartrate  100 mg Oral BID  . pantoprazole  40 mg Oral QAC breakfast  . polyethylene glycol  17 g Oral Daily  . Tamsulosin HCl  0.4 mg Oral Daily   Continuous Infusions:  PRN Meds:.acetaminophen, acetaminophen, albuterol, alum & mag hydroxide-simeth, guaiFENesin-dextromethorphan, hydrALAZINE, HYDROcodone-acetaminophen, lidocaine, lubiprostone, morphine, ondansetron (ZOFRAN) IV, ondansetron, senna, senna-docusate, zolpidem  Assessment/Plan:  34 male with hx of HTN, DM, ? CKD , BPH mild dementia admitted with fall with Proximal humeral fracture. patient also noted to be anemic.    PLAN:   ACTIVE ISSUES:  *Proximal humeral fracture  Seen by orthopedics and recommended sling with PT  patient currently tolerating well Seen by PT with recs to SNF on discharge  Anemia  Patient noted to have drop in h &H which improved with 2 u PRBC. Hemoccult positive. Seen by GI who plan on EGD/ c scope once outside  records available.called his  PCP Dr Sherril Croon in [place] today and got reports from his previous EGD/ csope he had in 2006 by [facility] which showed otherwise normal colon and gastric erosions on EGD. Informed GI who will evaluate today  monitor h &H  Cont PPI  Cont iron tablets and folic acid   Acute renal failure  Appears to have underlying CKD, renal fn now improving  Cont to hold ACEi   Frequent falls  as per daughter he has been having frequent falls for past few months' head CT and MRI negative for CVA  ASA deced given low h &H with ? Gi bleed  -rehab as per PT  Cont fall precautions   Unstable HTN  cont amlod and metoprolol  Cont prn hydralazine  ACEi dced to to AKI   CHRONIC ISSUES:  DM (diabetes  mellitus)  Stable' cont current meds   BPH  cont flomax   Dementia  At baseline and stable    LOS: 4 days   Dolores Ewing 04/23/2011, 11:29 AM

## 2011-04-23 NOTE — Telephone Encounter (Signed)
I have discussed risks & benefits which include, but are not limited to, bleeding, infection, perforation & drug reaction.  [name] agrees with this plan & written consent will be obtained.

## 2011-04-23 NOTE — Progress Notes (Signed)
Physical Therapy Treatment Patient Details Name: Gloyd Happ Fleischer MRN: [id number] DOB: [birth year] Today's Date: 04/23/2011  PT Assessment/Plan   PT Goals  Acute Rehab PT Goals Pt will go Supine/Side to Sit: with min assist PT Goal: Supine/Side to Sit - Progress: Progressing toward goal PT Transfer Goal: Sit to Stand/Stand to Sit - Progress: Revised (modified due to lack of progress/goal met) (now  work toward transfer with supervision) Pt will Ambulate: 16 - 50 feet;with min assist (with hemi walker) PT Goal: Ambulate - Progress: Progressing toward goal  PT Treatment Precautions/Restrictions  Precautions Precautions: Fall Type of Shoulder Precautions: Right shoulder in sling Required Braces or Orthoses: Yes Other Brace/Splint: sling for RUE Restrictions Weight Bearing Restrictions: No Mobility (including Balance) Bed Mobility Rolling Left: 3: Mod assist;With rail Rolling Left Details (indicate cue type and reason): manual assist to have pelvis guide rolling Left Sidelying to Sit: 3: Mod assist Left Sidelying to Sit Details (indicate cue type and reason): cues needed for pt to push up with LUE Sitting - Scoot to Edge of Bed: 5: Supervision Transfers Transfers: Yes Sit to Stand: 4: Min assist Sit to Stand Details (indicate cue type and reason): cues for LUE to push up from bed  Stand to Sit: 5: Supervision Ambulation/Gait Ambulation/Gait: Yes Ambulation/Gait Assistance: 3: Mod assist Ambulation/Gait Assistance Details (indicate cue type and reason): pt using hemi walker...standing balance much improved and pt did well with learning gait sequence with new type of walker Ambulation Distance (Feet): 12 Feet (ambulated twice) Assistive device: Hemi-walker Gait Pattern: Decreased step length - left Stairs: No Wheelchair Mobility Wheelchair Mobility: No  Dynamic Standing Balance Dynamic Standing - Level of Assistance: 3: Mod assist Exercise  General Exercises - Lower  Extremity Ankle Circles/Pumps: AROM;Both;10 reps;Supine Heel Slides: AROM;Both;10 reps;Supine Shoulder Exercises Elbow Extension: PROM;AROM;10 reps;Right Wrist Flexion: PROM;AROM;10 reps;Right Wrist Extension: PROM;AROM;10 reps;Right Digit Composite Flexion: PROM;AROM;10 reps;Right Composite Extension: AROM;10 reps;Right End of Session PT - End of Session Equipment Utilized During Treatment: Gait belt Activity Tolerance: Patient tolerated treatment well;Patient limited by fatigue Patient left: in bed;with call bell in reach;with bed alarm set General Behavior During Session: [facility] for tasks performed Cognition: [facility] for tasks performed  Konrad Penta 04/23/2011, 2:13 PM

## 2011-04-23 NOTE — Progress Notes (Signed)
HEME POS, NORMOCYTIC ANEMIA-FERRITIN 258 IN THE SETTING OF CRI(Cr 1.5-1.93). Most likely 2o to anemia of chronic disease, but may be exacerbated bY GI blood loss. LAST TCS/EGD > 5 YEARS-HX: BARRETT'S AND COLON POLYPS. TCS/EGD 10/30.

## 2011-04-24 ENCOUNTER — Telehealth: Payer: Self-pay | Admitting: Gastroenterology

## 2011-04-24 DIAGNOSIS — D649 Anemia, unspecified: Secondary | ICD-10-CM

## 2011-04-24 DIAGNOSIS — S42209A Unspecified fracture of upper end of unspecified humerus, initial encounter for closed fracture: Secondary | ICD-10-CM

## 2011-04-24 LAB — BASIC METABOLIC PANEL
Calcium: 10.2 mg/dL (ref 8.4–10.5)
GFR calc Af Amer: 52 mL/min — ABNORMAL LOW (ref >90)
GFR calc non Af Amer: 44 mL/min — ABNORMAL LOW (ref >90)
Sodium: 138 mEq/L (ref 135–145)

## 2011-04-24 LAB — TYPE AND SCREEN
ABO/RH(D): O POS
Unit division: 0
Unit division: 0

## 2011-04-24 LAB — CBC
MCH: 29.4 pg (ref 26.0–34.0)
MCHC: 32.4 g/dL (ref 30.0–36.0)
Platelets: 178 10*3/uL (ref 150–400)
RBC: 3.47 MIL/uL — ABNORMAL LOW (ref 4.22–5.81)

## 2011-04-24 MED ORDER — POLYETHYLENE GLYCOL 3350 17 G PO PACK
17.0000 g | PACK | ORAL | Status: AC
  Filled 2011-04-24: qty 1

## 2011-04-24 MED ORDER — PEG 3350-KCL-NABCB-NACL-NASULF 236 G PO SOLR
2000.0000 mL | Freq: Once | ORAL | Status: AC
  Administered 2011-04-24: 2000 mL via ORAL
  Filled 2011-04-24: qty 4000

## 2011-04-24 MED ORDER — POLYETHYLENE GLYCOL 3350 17 G PO PACK
17.0000 g | PACK | ORAL | Status: AC
  Administered 2011-04-24 (×3): 17 g via ORAL
  Filled 2011-04-24 (×2): qty 1

## 2011-04-24 MED ORDER — PEG 3350-KCL-NABCB-NACL-NASULF 236 G PO SOLR
2000.0000 mL | Freq: Once | ORAL | Status: DC
  Filled 2011-04-24: qty 4000

## 2011-04-24 MED ORDER — HYDRALAZINE HCL 20 MG/ML IJ SOLN: 10.0000 mg | INTRAMUSCULAR | Status: DC | PRN

## 2011-04-24 MED ORDER — SODIUM CHLORIDE 0.9 % IJ SOLN
INTRAMUSCULAR | Status: AC
  Filled 2011-04-24: qty 10

## 2011-04-24 MED ORDER — HYDRALAZINE HCL 20 MG/ML IJ SOLN
20.0000 mg | INTRAMUSCULAR | Status: DC | PRN
  Administered 2011-04-24: 20 mg via INTRAVENOUS
  Filled 2011-04-24: qty 1

## 2011-04-24 MED ORDER — MAGIC MOUTHWASH
5.0000 mL | Freq: Three times a day (TID) | ORAL | Status: DC
  Administered 2011-04-24 – 2011-04-26 (×6): 5 mL via ORAL
  Filled 2011-04-24 (×6): qty 5

## 2011-04-24 MED ORDER — HYDRALAZINE HCL 25 MG PO TABS
25.0000 mg | ORAL_TABLET | Freq: Once | ORAL | Status: AC
  Administered 2011-04-24: 25 mg via ORAL
  Filled 2011-04-24: qty 1

## 2011-04-24 MED ORDER — BISACODYL 5 MG PO TBEC
10.0000 mg | DELAYED_RELEASE_TABLET | ORAL | Status: AC
  Administered 2011-04-24 (×2): 10 mg via ORAL
  Filled 2011-04-24: qty 4

## 2011-04-24 NOTE — Progress Notes (Signed)
Speech Pathology: Consult received for "Eval and Treat". Please clarify if a cognitive linguistic evaluation is wanted or a bedside swallow evaluation. Pt is currently being followed by GI and has baseline dementia.  Thank you, Havery Moros, CCC-SLP [phone]

## 2011-04-24 NOTE — Progress Notes (Signed)
Occupational Therapy Evaluation Patient Details Name: [name] MRN: [id number] DOB: 08-15-1926 Today's Date: 04/24/2011  OT Assessment/Plan OT Assessment/Plan Comments on Treatment Session: Much more cooperative today, willing to get out of bed. OT Plan: Discharge plan remains appropriate OT Frequency: Min 2X/week Follow Up Recommendations: Skilled nursing facility Equipment Recommended: Defer to next venue OT Goals    OT Treatment Precautions/Restrictions  Restrictions Weight Bearing Restrictions: No   ADL ADL Toilet Transfer: Minimal assistance Toilet Transfer Details (indicate cue type and reason): mod cues Toilet Transfer Method: Stand pivot Toilet Transfer Equipment: Bedside commode Mobility  Bed Mobility Supine to Sit Details (indicate cue type and reason): max assist Transfers Transfers: Yes Sit to Stand: 2: Max assist Sit to Stand Details (indicate cue type and reason): mod cues for safety Stand to Sit: 4: Min assist Stand to Sit Details: mod assist for safety and reaching for chair. Exercises Shoulder Exercises Elbow Extension: PROM;AROM;15 reps Wrist Flexion: PROM;AROM;15 reps Wrist Extension: PROM;AROM;15 reps Digit Composite Flexion: PROM;AROM;15 reps Composite Extension: PROM;AROM;15 reps Hand Exercises Forearm Supination: PROM;AROM;15 reps Forearm Pronation: PROM;AROM;15 reps Wrist Flexion: PROM;AROM;15 reps Wrist Extension: PROM;AROM;15 reps Digit Composite Flexion: PROM;AROM;15 reps Composite Extension: PROM;AROM;15 reps  End of Session OT - End of Session Equipment Utilized During Treatment: Gait belt Activity Tolerance: Patient tolerated treatment well Patient left: in chair General Behavior During Session: Agitated (kept trying to pull off monitor and pulling at sling. ) Cognition: WFL for tasks performed  Silvia Markuson L. Shaleen Talamantez, COTA/L   04/24/2011, 11:19 AM

## 2011-04-24 NOTE — Progress Notes (Signed)
Subjective: Since I last evaluated the patient. He was unable to complete GOLYTELY bowel prep due to vomiting. Pt now taking Miralax prep + Dulcolax. TOLERATING MIRALAX PREP. NO BRIGHT RED BLOOD SEEN DURING PREP  Objective: Vital signs in last 24 hours: Temp:  [97.5 F (36.4 C)-98.9 F (37.2 C)] 97.5 F (36.4 C) (10/30 1000) Pulse Rate:  [51-85] 85  (10/30 1000) Resp:  [18-20] 20  (10/30 1000) BP: (136-198)/(73-77) 198/75 mmHg (10/30 1000) SpO2:  [93 %-97 %] 97 % (10/30 1000) Last BM Date: 04/23/11  Intake/Output from previous day: 10/29 0701 - 10/30 0700 In: 3040 [P.O.:3040] Out: 800 [Urine:800] Intake/Output this shift: Total I/O In: -  Out: 201 [Urine:200; Stool:1]  General appearance: alert, cooperative and appears stated age Resp: clear to auscultation bilaterally, FAIR AIR MOVEMENT Cardio: regular rate and rhythm, DISTANT HEART SIUNDS GI: soft, non-tender; bowel sounds normal NEURO: NO NEW FOCAL DEFICITS  Lab Results:  Basename 04/24/11 0501 04/23/11 0421 04/22/11 0432  WBC 5.3 4.6 5.0  HGB 10.2* 9.7* 9.5*  HCT 31.5* 30.1* 29.8*  PLT 178 180 191   BMET  Basename 04/24/11 0501 04/23/11 0421 04/22/11 0432  NA 138 136 139  K 4.9 5.1 5.2*  CL 109 110 114*  CO2 20 21 19   GLUCOSE 135* 108* 115*  BUN 27* 28* 30*  CREATININE 1.42* 1.52* 1.75*  CALCIUM 10.2 9.7 9.3   Studies/Results: No results found.  Medications: I have reviewed the patient's current medications.  Assessment/Plan: ANEMIA MOST LIKELY 2O TO CHRONIC DISEASE, BUT PT W/ HEME POS STOOLS. NO ACTIVE GI BLEED.  PLAN: 1. CONTINUE mlx PREP. 2. ADD DULCOLAX TODAY 3. CLEAR LIQUIDS THE NPO AFTER MN EXCEPT MEDS    LOS: 5 days   Bing Duffey 04/24/2011, 2:07 PM
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
## 2011-04-24 NOTE — Progress Notes (Signed)
RN Soledad Gerlach called.  Pt vomited part of golytely earlier this AM. He still has dark brown stool. Has completed 2 enemas & dulcolax. Instructed RN for pt to continue to give 8oz Golytely q until CLEAR. Page me when pt is clear & ready for procedure.  Discussed w/ Dr Darrick Penna D/C Golytely Start Miralax 17 grams q 1 hr in 8oz liquid x3 8oz liquid q 1h, after miralax x3 Plan procedures 1400 today if clear Hope/[facility] aware

## 2011-04-24 NOTE — Progress Notes (Signed)
Subjective: Patient seen and examined this morning. denies any complain. Scheduled for EGD/ c sope today  Objective:  Vital signs in last 24 hours:  Filed Vitals:   04/23/11 2224 04/24/11 0211 04/24/11 0612 04/24/11 1000  BP: 136/77 183/73 198/77 198/75  Pulse: 76 77 51 85  Temp: 98.3 F (36.8 C) 98.9 F (37.2 C) 98.1 F (36.7 C) 97.5 F (36.4 C)  TempSrc:  Oral Oral   Resp: 20 18 18 20   Height:      Weight:      SpO2: 95% 96% 96% 97%   General: Elderly male lying in bed in no acute distress.  HEENT: no pallor, no icterus, moist oral mucosa, no JVD, no lymphadenopathy  Heart: Normal s1 &s2 Regular rate and rhythm, without murmurs, rubs, gallops.  Lungs: Clear to auscultation bilaterally.  Abdomen: Soft, nontender, nondistended, positive bowel sounds.  Extremities: No clubbing cyanosis or edema with positive pedal pulses. Sling over rt arm  Neuro: Alert, awake, oriented x2, nonfocal.  Intake/Output from previous day:   Intake/Output Summary (Last 24 hours) at 04/24/11 1340 Last data filed at 04/24/11 0900  Gross per 24 hour  Intake   3040 ml  Output    801 ml  Net   2239 ml    Physical Exam:     Lab Results:  Basic Metabolic Panel:    Component Value Date/Time   NA 138 04/24/2011 0501   K 4.9 04/24/2011 0501   CL 109 04/24/2011 0501   CO2 20 04/24/2011 0501   BUN 27* 04/24/2011 0501   CREATININE 1.42* 04/24/2011 0501   GLUCOSE 135* 04/24/2011 0501   CALCIUM 10.2 04/24/2011 0501   CBC:    Component Value Date/Time   WBC 5.3 04/24/2011 0501   HGB 10.2* 04/24/2011 0501   HCT 31.5* 04/24/2011 0501   PLT 178 04/24/2011 0501   MCV 90.8 04/24/2011 0501   NEUTROABS 10.0* 04/19/2011 1245   LYMPHSABS 0.5* 04/19/2011 1245   MONOABS 0.7 04/19/2011 1245   EOSABS 0.0 04/19/2011 1245   BASOSABS 0.0 04/19/2011 1245    No results found for this or any previous visit (from the past 240 hour(s)).  Studies/Results: No results  found.  Medications: Scheduled Meds:   . acetaminophen  650 mg Oral Once  . amLODipine  10 mg Oral Daily  . antiseptic oral rinse   Mouth Rinse TID PC  . bisacodyl  10 mg Oral Q2H  . bisacodyl  5 mg Oral Q2H  . chlorhexidine  15 mL Mouth/Throat BID  . folic acid  1 mg Oral Daily  . insulin aspart  0-9 Units Subcutaneous TID WC  . magic mouthwash  5 mL Oral TID BM  . metoprolol tartrate  100 mg Oral BID  . pantoprazole  40 mg Oral QAC breakfast  . polyethylene glycol  2,000 mL Oral Once   Followed by  . polyethylene glycol  2,000 mL Oral Once  . polyethylene glycol  17 g Oral Q1 Hr x 3  . polyethylene glycol  17 g Oral Q1 Hr x 3  . Tamsulosin HCl  0.4 mg Oral Daily  . DISCONTD: polyethylene glycol  2,000 mL Oral Once  . DISCONTD: polyethylene glycol  2,000 mL Oral Once  . DISCONTD: polyethylene glycol  17 g Oral Daily   Continuous Infusions:  PRN Meds:.acetaminophen, acetaminophen, albuterol, alum & mag hydroxide-simeth, guaiFENesin-dextromethorphan, hydrALAZINE, HYDROcodone-acetaminophen, lidocaine, lubiprostone, morphine, ondansetron (ZOFRAN) IV, ondansetron, senna, senna-docusate, zolpidem, DISCONTD: hydrALAZINE, DISCONTD: hydrALAZINE  Assessment/Plan:  84  male with hx of HTN, DM, ? CKD , BPH mild dementia admitted with fall with Proximal humeral fracture. patient also noted to be anemic.   PLAN:  ACTIVE ISSUES:  *Proximal humeral fracture  Seen by orthopedics and recommended sling with PT  patient currently tolerating well  Seen by PT with recs to SNF on discharge   Anemia  Patient noted to have drop in h &H which improved with 2 u PRBC. Hemoccult positive. Seen by GI Jena Gauss)  who plan on EGD/ c scope once outside records available.called his PCP Dr Sherril Croon in [place]  and got reports from his previous EGD/ csope he had in 2006 by [facility] which showed otherwise normal colon and gastric erosions on EGD. GI plan on EGD/ c sope later today monitor h &H  Cont PPI  Cont iron  tablets and folic acid   Acute renal failure  Appears to have underlying CKD, renal fn now improving  Cont to hold ACEi   Frequent falls  as per daughter he has been having frequent falls for past few months' head CT and MRI negative for CVA  ASA deced given low h &H with ? Gi bleed  -rehab as per PT  Cont fall precautions   Unstable HTN  Noted for elevated BP again overnight cont amlod and metoprolol , will increase dose of hydralazine prn ACEi dced to to AKI    CHRONIC ISSUES:  DM (diabetes mellitus)  Stable' cont current meds  BPH  cont flomax  Dementia  At baseline and stable   Dispo: SNF  Pending issues:  EGD/ colonosopy likely later this afternoon     LOS: 5 days   Bensyn Bornemann 04/24/2011, 1:40 PM

## 2011-04-24 NOTE — Telephone Encounter (Signed)
No appt in two months needed. Patient going to have procedures done as inpatient.  Please take out NIC.

## 2011-04-24 NOTE — Progress Notes (Signed)
Pt received 2 tap water enema's.  Stool is liquid, no solid pieces, stool is dark, but is clearing up.  Pt still continues to slowly drink golytely.

## 2011-04-24 NOTE — Progress Notes (Signed)
CSW presented bed offers to pt's son who chooses [facility].  Facility notified and is working on [place].  OT notes forwarded to assist with SNF auth.  CSW to continue to follow.  Karn Cassis

## 2011-04-24 NOTE — Progress Notes (Signed)
Pt given Golytely and told to drink 8oz every 15 min.  Pt did this for approx 45 min, then vomited small amt of the golytley back up.  Pt stated he needs to drink more slowly.  Frequently checking in on pt to encourage pt to drink.

## 2011-04-25 ENCOUNTER — Encounter (HOSPITAL_COMMUNITY)
Admission: EM | Disposition: A | Discharge status: Skilled Nursing Facility | Payer: Self-pay | Source: Home / Self Care | Attending: Internal Medicine

## 2011-04-25 ENCOUNTER — Encounter (HOSPITAL_COMMUNITY): Payer: Self-pay | Admitting: *Deleted

## 2011-04-25 ENCOUNTER — Other Ambulatory Visit: Payer: Self-pay | Admitting: Gastroenterology

## 2011-04-25 ENCOUNTER — Encounter: Payer: Self-pay | Admitting: Gastroenterology

## 2011-04-25 DIAGNOSIS — D126 Benign neoplasm of colon, unspecified: Secondary | ICD-10-CM

## 2011-04-25 DIAGNOSIS — D509 Iron deficiency anemia, unspecified: Secondary | ICD-10-CM

## 2011-04-25 DIAGNOSIS — R195 Other fecal abnormalities: Secondary | ICD-10-CM

## 2011-04-25 DIAGNOSIS — K209 Esophagitis, unspecified: Secondary | ICD-10-CM

## 2011-04-25 DIAGNOSIS — K259 Gastric ulcer, unspecified as acute or chronic, without hemorrhage or perforation: Secondary | ICD-10-CM

## 2011-04-25 HISTORY — PX: COLONOSCOPY: SHX5424

## 2011-04-25 HISTORY — PX: ESOPHAGOGASTRODUODENOSCOPY: SHX5428

## 2011-04-25 SURGERY — COLONOSCOPY
Anesthesia: Moderate Sedation

## 2011-04-25 MED ORDER — MEPERIDINE HCL 100 MG/ML IJ SOLN
INTRAMUSCULAR | Status: DC | PRN
  Administered 2011-04-25: 25 mg via INTRAVENOUS

## 2011-04-25 MED ORDER — MEPERIDINE HCL 100 MG/ML IJ SOLN
INTRAMUSCULAR | Status: AC
  Filled 2011-04-25: qty 1

## 2011-04-25 MED ORDER — PANTOPRAZOLE SODIUM 40 MG PO TBEC
40.0000 mg | DELAYED_RELEASE_TABLET | Freq: Two times a day (BID) | ORAL | Status: DC
  Administered 2011-04-25 – 2011-04-26 (×2): 40 mg via ORAL
  Filled 2011-04-25 (×4): qty 1

## 2011-04-25 MED ORDER — MIDAZOLAM HCL 5 MG/5ML IJ SOLN
INTRAMUSCULAR | Status: AC
  Filled 2011-04-25: qty 10

## 2011-04-25 MED ORDER — BUTAMBEN-TETRACAINE-BENZOCAINE 2-2-14 % EX AERO
INHALATION_SPRAY | CUTANEOUS | Status: DC | PRN
  Administered 2011-04-25: 1 via TOPICAL

## 2011-04-25 MED ORDER — SODIUM CHLORIDE 0.45 % IV SOLN
Freq: Once | INTRAVENOUS | Status: AC
  Administered 2011-04-25: 09:00:00 via INTRAVENOUS

## 2011-04-25 MED ORDER — MIDAZOLAM HCL 5 MG/5ML IJ SOLN
INTRAMUSCULAR | Status: DC | PRN
  Administered 2011-04-25: 2 mg via INTRAVENOUS
  Administered 2011-04-25 (×2): 1 mg via INTRAVENOUS
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
## 2011-04-25 NOTE — Progress Notes (Signed)
Pt.'s blood pressure 198/80. Gave pt. IV hydralazine as ordered with running NS IV. After administering the medication, the pt's IV infiltrated. The infiltration was about an inch in size and red. The pt. Complained of slight pain in the arm. Consulted pharmacy about medication to verify it was not vessicant/no adverse reactions. Pharmacy confirmed it was not, and advised to place heat on the site. MD was also notified of the incident. Placed heat pack on the pt's arm. MD changed hydralazine to PO. Continued to monitor the site. Swelling improved. Reported off to the next nurse and informed her of the situation. Nurse will continue to monitor.

## 2011-04-25 NOTE — Progress Notes (Signed)
Subjective: This man was admitted with a fall and has a fracture in the right proximal humerus which is being treated with a sling. He was also noted to be anemic to the point where he needed blood transfusions. He also has acute on chronic renal insufficiency. Hemoccult was positive and he has just returned for an upper GI and lower GI endoscopy. Colonoscopy shows sessile polyps which were removed, 2 in the transverse colon. Also there were internal hemorrhoids. EGD shows possible Barrett's esophagus and esophagitis in the distal esophagus. There were multiple ulcers in the antrum likely secondary to aspirin and biopsies have been taken for H. pylori. The overall feeling by Dr. Darrick Penna was that the anemia was secondary to a combination of chronic renal insufficiency, esophagitis, peptic ulcer disease, polyps and internal hemorrhoids in the setting of anticoagulation. In any case there is no evidence of acute bleeding at the present time.           Physical Exam: Blood pressure 154/56, pulse 67, temperature 98.1 F (36.7 C), temperature source Oral, resp. rate 14, height 5\' 9"  (1.753 m), weight 94.802 kg (209 lb), SpO2 100.00%. He looks slightly groggy but systemically well. He is hemodynamically stable. Heart sounds are present and normal. Lung fields are clear. Abdomen is soft nontender. He is alert and appears to be reasonably appropriate to my questions. There are no focal neurological signs.   Investigations: Results for orders placed during the hospital encounter of 04/19/11 (from the past 48 hour(s))  GLUCOSE, CAPILLARY     Status: Abnormal   Collection Time   04/23/11  9:34 PM      Component Value Range Comment   Glucose-Capillary 151 (*) 70 - 99 (mg/dL)    Comment 1 Notify RN      Comment 2 Documented in Chart     CBC     Status: Abnormal   Collection Time   04/24/11  5:01 AM      Component Value Range Comment   WBC 5.3  4.0 - 10.5 (K/uL)    RBC 3.47 (*) 4.22 - 5.81 (MIL/uL)     Hemoglobin 10.2 (*) 13.0 - 17.0 (g/dL)    HCT 14.7 (*) 82.9 - 52.0 (%)    MCV 90.8  78.0 - 100.0 (fL)    MCH 29.4  26.0 - 34.0 (pg)    MCHC 32.4  30.0 - 36.0 (g/dL)    RDW 56.2  13.0 - 86.5 (%)    Platelets 178  150 - 400 (K/uL)   BASIC METABOLIC PANEL     Status: Abnormal   Collection Time   04/24/11  5:01 AM      Component Value Range Comment   Sodium 138  135 - 145 (mEq/L)    Potassium 4.9  3.5 - 5.1 (mEq/L)    Chloride 109  96 - 112 (mEq/L)    CO2 20  19 - 32 (mEq/L)    Glucose, Bld 135 (*) 70 - 99 (mg/dL)    BUN 27 (*) 6 - 23 (mg/dL)    Creatinine, Ser 7.84 (*) 0.50 - 1.35 (mg/dL)    Calcium 69.6  8.4 - 10.5 (mg/dL)    GFR calc non Af Amer 44 (*) >90 (mL/min)    GFR calc Af Amer 52 (*) >90 (mL/min)   GLUCOSE, CAPILLARY     Status: Abnormal   Collection Time   04/24/11  7:29 AM      Component Value Range Comment   Glucose-Capillary 116 (*) 70 -  99 (mg/dL)   GLUCOSE, CAPILLARY     Status: Abnormal   Collection Time   04/24/11 11:29 AM      Component Value Range Comment   Glucose-Capillary 110 (*) 70 - 99 (mg/dL)   GLUCOSE, CAPILLARY     Status: Abnormal   Collection Time   04/24/11  4:30 PM      Component Value Range Comment   Glucose-Capillary 114 (*) 70 - 99 (mg/dL)   GLUCOSE, CAPILLARY     Status: Abnormal   Collection Time   04/24/11  9:22 PM      Component Value Range Comment   Glucose-Capillary 147 (*) 70 - 99 (mg/dL)    Comment 1 Notify RN     GLUCOSE, CAPILLARY     Status: Normal   Collection Time   04/25/11 12:18 PM      Component Value Range Comment   Glucose-Capillary 98  70 - 99 (mg/dL)    No results found for this or any previous visit (from the past 240 hour(s)).  No results found.    Medications: I have reviewed the patient's current medications.  Impression: 1. Right proximal humeral fracture. 2. Anemia, multifactorial, status post blood transfusion. 3. Mild/moderate dementia. 4. Hypertension. 5. Diabetes mellitus. 6. BPH. 7. Acute  on chronic renal failure, improving.     Plan: 1. Continue to monitor hemoglobin and renal function. 2. Possible discharge to skilled nursing facility in the next 1-2 days.     LOS: 6 days   GOSRANI,NIMISH C 04/25/2011, 1:48 PM

## 2011-04-25 NOTE — Interval H&P Note (Signed)
History and Physical Interval Note:   04/25/2011   8:49 AM   [name]  has presented today for surgery, with the diagnosis of anemia, heme positive stool, hx Barrett's  The various methods of treatment have been discussed with the patient and family. After consideration of risks, benefits and other options for treatment, the patient has consented to  Procedure(s): COLONOSCOPY ESOPHAGOGASTRODUODENOSCOPY (EGD) as a surgical intervention .  The patients' history has been reviewed, patient examined, no change in status, stable for surgery.  I have reviewed the patients' chart and labs.  Questions were answered to the patient's satisfaction.     Jonette Eva  MD   THE PATIENT WAS EXAMINED AND THERE IS NO CHANGE IN THE PATIENT'S CONDITION SINCE THE ORIGINAL H&P WAS COMPLETED.

## 2011-04-25 NOTE — Telephone Encounter (Signed)
SF sent me a staff message and said pt needed to be seen in 1 months by her or extender/anemia and heme pos stools. I could not reach pt by phone or leave voice mail. Appt card was mailed to pt to have OV on 12/19 at 10am with SF

## 2011-04-26 LAB — GLUCOSE, CAPILLARY: Glucose-Capillary: 109 mg/dL — ABNORMAL HIGH (ref 70–99)

## 2011-04-26 LAB — COMPREHENSIVE METABOLIC PANEL
BUN: 37 mg/dL — ABNORMAL HIGH (ref 6–23)
CO2: 20 mEq/L (ref 19–32)
Calcium: 9.4 mg/dL (ref 8.4–10.5)
Chloride: 106 mEq/L (ref 96–112)
Creatinine, Ser: 2.16 mg/dL — ABNORMAL HIGH (ref 0.50–1.35)
GFR calc Af Amer: 31 mL/min — ABNORMAL LOW (ref >90)
GFR calc non Af Amer: 27 mL/min — ABNORMAL LOW (ref >90)
Glucose, Bld: 120 mg/dL — ABNORMAL HIGH (ref 70–99)
Total Bilirubin: 0.3 mg/dL (ref 0.3–1.2)

## 2011-04-26 LAB — CBC
HCT: 30.5 % — ABNORMAL LOW (ref 39.0–52.0)
Hemoglobin: 9.8 g/dL — ABNORMAL LOW (ref 13.0–17.0)
MCH: 29.4 pg (ref 26.0–34.0)
MCV: 91.6 fL (ref 78.0–100.0)
RBC: 3.33 MIL/uL — ABNORMAL LOW (ref 4.22–5.81)

## 2011-04-26 MED ORDER — GUAIFENESIN-DM 100-10 MG/5ML PO SYRP: 5.0000 mL | ORAL_SOLUTION | ORAL | Status: AC | PRN

## 2011-04-26 MED ORDER — AMLODIPINE BESYLATE 10 MG PO TABS: 10.0000 mg | ORAL_TABLET | Freq: Every day | ORAL | Status: DC

## 2011-04-26 MED ORDER — TAMSULOSIN HCL 0.4 MG PO CAPS: 0.4000 mg | ORAL_CAPSULE | Freq: Every day | ORAL | Status: DC

## 2011-04-26 MED ORDER — SENNOSIDES-DOCUSATE SODIUM 8.6-50 MG PO TABS: 1.0000 | ORAL_TABLET | Freq: Every evening | ORAL | Status: DC | PRN

## 2011-04-26 MED ORDER — PANTOPRAZOLE SODIUM 40 MG PO TBEC: 40.0000 mg | DELAYED_RELEASE_TABLET | Freq: Two times a day (BID) | ORAL | Status: DC

## 2011-04-26 MED ORDER — HYDROCODONE-ACETAMINOPHEN 5-325 MG PO TABS: 1.0000 | ORAL_TABLET | ORAL | Status: AC | PRN

## 2011-04-26 MED ORDER — BIOTENE DRY MOUTH MT LIQD: Freq: Two times a day (BID) | OROMUCOSAL | Status: DC

## 2011-04-26 MED ORDER — METOPROLOL TARTRATE 100 MG PO TABS: 100.0000 mg | ORAL_TABLET | Freq: Two times a day (BID) | ORAL | Status: DC

## 2011-04-26 NOTE — Progress Notes (Signed)
[facility] here to get pt for transport to [facility]. REport called to Jeri Modena, LPN. Pt stable on discharge.

## 2011-04-26 NOTE — Discharge Summary (Signed)
Physician Discharge Summary  Patient ID: [name] MRN: [id number] DOB/AGE: 11-06-1926 77 y.o. Primary Care Physician:VYAS,DHRUV B., MD, MD Admit date: 04/19/2011 Discharge date: 04/26/2011    Discharge Diagnoses:  1. Right proximal humeral fracture treated with sling for 2 weeks and then occupational therapy for 6 weeks. Followup with Dr. Fuller Canada in one week. 2. Anemia, multifactorial, status post 2 units blood transfusion. Multiple etiologies include transverse colon polyps, internal hemorrhoids, esophagitis in the distal esophagus and multiple ulcers in the antrum as well as chronic kidney disease in the setting of aspirin therapy. 3. Mild/moderate dementia. 4. Hypertension. 5. Type 2 diabetes mellitus. 6. BPH. 7. Acute on chronic renal failure, stabilizing.   Current Discharge Medication List    START taking these medications   Details  amLODipine (NORVASC) 10 MG tablet Take 1 tablet (10 mg total) by mouth daily. Qty: 30 tablet, Refills: 0    guaiFENesin-dextromethorphan (ROBITUSSIN DM) 100-10 MG/5ML syrup Take 5 mLs by mouth every 4 (four) hours as needed for cough. Qty: 118 mL, Refills: 0    HYDROcodone-acetaminophen (NORCO) 5-325 MG per tablet Take 1-2 tablets by mouth every 4 (four) hours as needed. Qty: 30 tablet, Refills: 0    pantoprazole (PROTONIX) 40 MG tablet Take 1 tablet (40 mg total) by mouth 2 (two) times daily before a meal. Qty: 60 tablet, Refills: 0   Associated Diagnoses: Anemia    senna-docusate (SENOKOT-S) 8.6-50 MG per tablet Take 1 tablet by mouth at bedtime as needed for constipation. Qty: 30 tablet, Refills: 0      CONTINUE these medications which have CHANGED   Details  metoprolol (LOPRESSOR) 100 MG tablet Take 1 tablet (100 mg total) by mouth 2 (two) times daily. Qty: 60 tablet, Refills: 0    Tamsulosin HCl (FLOMAX) 0.4 MG CAPS Take 1 capsule (0.4 mg total) by mouth daily. Qty: 30 capsule, Refills: 0      CONTINUE these  medications which have NOT CHANGED   Details  lubiprostone (AMITIZA) 24 MCG capsule Take 24 mcg by mouth daily as needed. For constipation       STOP taking these medications     aspirin 325 MG tablet      betamethasone dipropionate (DIPROLENE) 0.05 % cream      glipiZIDE (GLUCOTROL) 5 MG tablet      Magnesium Salicylate Tetrahyd 580 MG TABS      OVER THE COUNTER MEDICATION      phenazopyridine (PYRIDIUM) 200 MG tablet      SALINE NASAL MIST NA      valsartan (DIOVAN) 320 MG tablet         Discharged Condition: Stable.    Consults: Gastroenterology, Dr. Darrick Penna.                   Orthopedics, Dr. Fuller Canada.  Significant Diagnostic Studies: Dg Chest 1 View  04/19/2011  *RADIOLOGY REPORT*  Clinical Data: 77 year old male with right shoulder pain and bruising.  Fall 2 days ago.  CHEST - 1 VIEW  Comparison: 06/05/2007.  Findings: AP semi upright portable view the chest at 1346 hours. Deformity of the proximal right humerus appears represent a comminuted humeral head and surgical neck fracture.  There also appears to be heterotopic calcification about the right humeral head which is new from 2008.  Stable lung volumes.  Cardiac size and mediastinal contours are within normal limits.  No pneumothorax, pulmonary edema, pleural effusion or confluent pulmonary opacity.  IMPRESSION: 1.  Right proximal humerus deformity.  There also appears to be heterotopic ossification about the femoral head which is new from 2008.  See shoulder series. 2. No acute cardiopulmonary abnormality.  Original Report Authenticated By: Harley Hallmark, M.D.   Dg Shoulder Right  04/19/2011  *RADIOLOGY REPORT*  Clinical Data: Fall.  Right shoulder pain.  RIGHT SHOULDER - 2+ VIEW  Comparison: Plain film chest 06/05/2007.  Findings: The patient has an impacted surgical neck fracture of the humerus which which appears to involve the greater tuberosity.   No dislocation.  Acromioclavicular joint is intact with  degenerative change noted.  Imaged right lung and ribs are unremarkable.  IMPRESSION: Impacted surgical neck fracture right humerus appears somewhat greater tuberosity.  Acromioclavicular osteoarthritis.  Original Report Authenticated By: Bernadene Bell. D'ALESSIO, M.D.   Dg Forearm Right  04/19/2011  *RADIOLOGY REPORT*  Clinical Data: Fall, pain.  RIGHT FOREARM - 2 VIEW  Comparison: None.  Findings: No acute bony or joint abnormality is identified.  The patient has old healed fractures of the distal radius and ulna.  IMPRESSION: No acute finding.  Old distal radius and ulnar fractures.  Original Report Authenticated By: Bernadene Bell. Maricela Curet, M.D.   Ct Head Wo Contrast  04/19/2011  *RADIOLOGY REPORT*  Clinical Data: Status post fall.  Laceration of forehead.  CT HEAD WITHOUT CONTRAST  Technique:  Contiguous axial images were obtained from the base of the skull through the vertex without contrast.  Comparison: Head CT scan 06/05/2007.  Findings: Laceration is seen over the right frontal bone.  No underlying fracture is identified.  The brain demonstrates mild cortical atrophy and some chronic microvascular ischemic change. No evidence of acute infarction, hemorrhage, mass lesion, mass effect, midline shift or abnormal extra-axial fluid collection.  No hydrocephalus or pneumocephalus.  IMPRESSION: Laceration over the right frontal bone.  No underlying fracture or acute intracranial abnormality.  Original Report Authenticated By: Bernadene Bell. Maricela Curet, M.D.   Mr Angiogram Head Wo Contrast  04/20/2011  *RADIOLOGY REPORT*  Clinical Data:  77 year old male status post fall with weakness.  Comparison: Head CT 04/19/2011.  MRA 06/06/2007.  MRI HEAD WITHOUT CONTRAST  Technique: Multiplanar, multiecho pulse sequences of the brain and surrounding structures were obtained according to standard protocol without intravenous contrast.  Findings:  Symmetric decreased diffusion corresponding to the cortical spinal tract at the  level of the corona radiata is without T2 were FLAIR hyperintensity and felt to be artifactual exaggeration of physiologic anisoptropy.  Diffusion signal elsewhere is within normal limits, no evidence of acute infarction.  Ventricular prominence appears to be on an ex vacuo basis.  Major intracranial vascular flow voids are preserved. No midline shift, mass effect, evidence of mass lesion, extra-axial collection or acute intracranial hemorrhage.  Cervicomedullary junction and pituitary are within normal limits.  Wallace Cullens and white matter signal is within normal limits for age throughout the brain.  Postoperative changes to the globes.  Minor paranasal sinus mucosal thickening.  Mastoids are clear. Visualized bone marrow signal is within normal limits.  Negative for age visualized cervical spine. Visualized scalp soft tissues are within normal limits.  IMPRESSION: 1. No acute intracranial abnormality. Suspect artifactual exaggeration of corticospinal tract anisoptropy on diffusion. 2.  Generalized volume loss. 3.  MRA findings are below.  MRA HEAD WITHOUT CONTRAST  Technique: Angiographic images of the Circle of Willis were obtained using MRA technique without  intravenous contrast.  Findings:  Study is moderately degraded by motion artifact despite repeated imaging attempts.  Antegrade flow in the posterior circulation.  Dominant distal right vertebral artery.  Vertebrobasilar junction and basilar artery are patent.  Bilateral AICA vessels are patent.  Superior cerebellar and PCA origins are patent.  Posterior communicating arteries are patent.  Fenestrated appearance of the right PCA re-identified. Preserved bilateral distal PCA flow.  Antegrade flow in both ICA siphons. No high-grade ICA stenosis. Stable carotid termini, MCA and ACA origins.  Distal bilateral MCA and ACA flow is grossly stable.  IMPRESSION: Degraded by motion, stable compared to the 2008 exam.  Original Report Authenticated By: Harley Hallmark, M.D.    Mr Brain Wo Contrast  04/20/2011  *RADIOLOGY REPORT*  Clinical Data:  77 year old male status post fall with weakness.  Comparison: Head CT 04/19/2011.  MRA 06/06/2007.  MRI HEAD WITHOUT CONTRAST  Technique: Multiplanar, multiecho pulse sequences of the brain and surrounding structures were obtained according to standard protocol without intravenous contrast.  Findings:  Symmetric decreased diffusion corresponding to the cortical spinal tract at the level of the corona radiata is without T2 were FLAIR hyperintensity and felt to be artifactual exaggeration of physiologic anisoptropy.  Diffusion signal elsewhere is within normal limits, no evidence of acute infarction.  Ventricular prominence appears to be on an ex vacuo basis.  Major intracranial vascular flow voids are preserved. No midline shift, mass effect, evidence of mass lesion, extra-axial collection or acute intracranial hemorrhage.  Cervicomedullary junction and pituitary are within normal limits.  Wallace Cullens and white matter signal is within normal limits for age throughout the brain.  Postoperative changes to the globes.  Minor paranasal sinus mucosal thickening.  Mastoids are clear. Visualized bone marrow signal is within normal limits.  Negative for age visualized cervical spine. Visualized scalp soft tissues are within normal limits.  IMPRESSION: 1. No acute intracranial abnormality. Suspect artifactual exaggeration of corticospinal tract anisoptropy on diffusion. 2.  Generalized volume loss. 3.  MRA findings are below.  MRA HEAD WITHOUT CONTRAST  Technique: Angiographic images of the Circle of Willis were obtained using MRA technique without  intravenous contrast.  Findings:  Study is moderately degraded by motion artifact despite repeated imaging attempts.  Antegrade flow in the posterior circulation.  Dominant distal right vertebral artery.  Vertebrobasilar junction and basilar artery are patent.  Bilateral AICA vessels are patent.  Superior  cerebellar and PCA origins are patent.  Posterior communicating arteries are patent.  Fenestrated appearance of the right PCA re-identified. Preserved bilateral distal PCA flow.  Antegrade flow in both ICA siphons. No high-grade ICA stenosis. Stable carotid termini, MCA and ACA origins.  Distal bilateral MCA and ACA flow is grossly stable.  IMPRESSION: Degraded by motion, stable compared to the 2008 exam.  Original Report Authenticated By: Harley Hallmark, M.D.    Lab Results: Results for orders placed during the hospital encounter of 04/19/11 (from the past 48 hour(s))  GLUCOSE, CAPILLARY     Status: Abnormal   Collection Time   04/24/11  4:30 PM      Component Value Range Comment   Glucose-Capillary 114 (*) 70 - 99 (mg/dL)   GLUCOSE, CAPILLARY     Status: Abnormal   Collection Time   04/24/11  9:22 PM      Component Value Range Comment   Glucose-Capillary 147 (*) 70 - 99 (mg/dL)    Comment 1 Notify RN     GLUCOSE, CAPILLARY     Status: Abnormal   Collection Time   04/25/11  8:35 AM      Component Value Range Comment   Glucose-Capillary 109 (*)  70 - 99 (mg/dL)    Comment 1 Documented in Chart     GLUCOSE, CAPILLARY     Status: Normal   Collection Time   04/25/11 12:18 PM      Component Value Range Comment   Glucose-Capillary 98  70 - 99 (mg/dL)   GLUCOSE, CAPILLARY     Status: Normal   Collection Time   04/25/11  4:34 PM      Component Value Range Comment   Glucose-Capillary 87  70 - 99 (mg/dL)   GLUCOSE, CAPILLARY     Status: Normal   Collection Time   04/25/11  9:00 PM      Component Value Range Comment   Glucose-Capillary 90  70 - 99 (mg/dL)   CBC     Status: Abnormal   Collection Time   04/26/11  5:04 AM      Component Value Range Comment   WBC 5.8  4.0 - 10.5 (K/uL)    RBC 3.33 (*) 4.22 - 5.81 (MIL/uL)    Hemoglobin 9.8 (*) 13.0 - 17.0 (g/dL)    HCT 16.1 (*) 09.6 - 52.0 (%)    MCV 91.6  78.0 - 100.0 (fL)    MCH 29.4  26.0 - 34.0 (pg)    MCHC 32.1  30.0 - 36.0 (g/dL)     RDW 04.5  40.9 - 81.1 (%)    Platelets 187  150 - 400 (K/uL)   COMPREHENSIVE METABOLIC PANEL     Status: Abnormal   Collection Time   04/26/11  5:04 AM      Component Value Range Comment   Sodium 136  135 - 145 (mEq/L)    Potassium 4.5  3.5 - 5.1 (mEq/L)    Chloride 106  96 - 112 (mEq/L)    CO2 20  19 - 32 (mEq/L)    Glucose, Bld 120 (*) 70 - 99 (mg/dL)    BUN 37 (*) 6 - 23 (mg/dL)    Creatinine, Ser 9.14 (*) 0.50 - 1.35 (mg/dL)    Calcium 9.4  8.4 - 10.5 (mg/dL)    Total Protein 6.3  6.0 - 8.3 (g/dL)    Albumin 2.6 (*) 3.5 - 5.2 (g/dL)    AST 17  0 - 37 (U/L)    ALT 13  0 - 53 (U/L)    Alkaline Phosphatase 101  39 - 117 (U/L)    Total Bilirubin 0.3  0.3 - 1.2 (mg/dL)    GFR calc non Af Amer 27 (*) >90 (mL/min)    GFR calc Af Amer 31 (*) >90 (mL/min)   GLUCOSE, CAPILLARY     Status: Normal   Collection Time   04/26/11  7:46 AM      Component Value Range Comment   Glucose-Capillary 95  70 - 99 (mg/dL)    Comment 1 Notify RN      No results found for this or any previous visit (from the past 240 hour(s)).   Hospital Course: This 77 year old man presented to the hospital with a fall and subsequent pain on the right side of his chest and arm. He was found to have a proximal right humeral fracture. There was no clinical or radiological evidence of stroke. In fact, an MRI of the brain was done which showed generalized volume loss but no acute intracranial abnormality. He has a background history of mild to moderate dementia. During hospitalization he was found to be anemic and Hemoccult positive stool. He required 2 units blood transfusion. He was  seen by gastroenterology, Dr. Darrick Penna who yesterday performed an upper GI and lower GI endoscopy. The colonoscopy showed sessile polyps which were removed, 2 of them are in the transverse colon. He also had internal hemorrhoids. EGD showed possible Barrett's esophagus and esophagitis in the distal esophagus. There were multiple ulcers in the  antrum likely secondary to aspirin and biopsies have been taken for H. pylori. The overall feeling was that the anemia was secondary to a combination of chronic renal insufficiency, esophagitis, peptic ulcer disease, polyps and internal hemorrhoids in the setting of anticoagulation. The patient has had no active GI bleeding. The patient is doing well today and is keen to be discharged. He will require rehabilitation secondary to his right humeral fracture.  Discharge Exam: Blood pressure 131/65, pulse 76, temperature 98.8 F (37.1 C), temperature source Oral, resp. rate 20, height 5\' 9"  (1.753 m), weight 94.5 kg (208 lb 5.4 oz), SpO2 93.00%. He looks systemically well. Heart sounds are present and normal. Lung fields are clear. He is alert and seems to be orientated. There are no obvious focal neurological signs. His abdomen is soft nontender.  Disposition: Skilled nursing facility for rehabilitation. A decision can be made later whether this patient would benefit from aspirin therapy in view of his peptic ulcers. I would anticipate healing of these ulcers first and then restart the aspirin.  Discharge Orders    Future Appointments: Provider: Department: Dept Phone: Center:   06/13/2011 10:00 AM Arlyce Harman, MD [facility] [phone] [facility]     Future Orders Please Complete By Expires   Diet - low sodium heart healthy      Diet - low sodium heart healthy      Increase activity slowly      Increase activity slowly         Follow-up Information    Follow up with Fuller Canada, MD. Make an appointment in 1 week.   Contact information:   [address] [place] [address] [phone]       Follow up with Jonette Eva, MD. Make an appointment in 1 month.   Contact information:   [id number] [place] [address] [phone]          Signed: Wilson Singer 04/26/2011, 11:44  AM

## 2011-04-26 NOTE — Progress Notes (Signed)
Subjective: Since I last evaluated the patient NO BRBRPR AND NO QUESTIONS OR CONCERNS.  Objective: Vital signs in last 24 hours: Temp:  [97.9 F (36.6 C)-98.8 F (37.1 C)] 98.8 F (37.1 C) (11/01 0553) Pulse Rate:  [62-71] 69  (11/01 0553) Resp:  [14-20] 20  (11/01 0553) BP: (143-147)/(66-72) 147/66 mmHg (11/01 0553) SpO2:  [96 %-100 %] 96 % (11/01 0553) Weight:  [208 lb 5.4 oz (94.5 kg)] 208 lb 5.4 oz (94.5 kg) (11/01 0553) Last BM Date: 04/25/11  Intake/Output from previous day: 10/31 0701 - 11/01 0700 In: 100 [P.O.:100] Out: 250 [Urine:250] Intake/Output this shift:    General appearance: alert, cooperative, appears stated age and no distress GI: soft, non-tender; bowel sounds normal; no masses,  no organomegaly  Lab Results:  [facility] 04/26/11 0504 04/24/11 0501  WBC 5.8 5.3  HGB 9.8* 10.2*  HCT 30.5* 31.5*  PLT 187 178   BMET  Basename 04/26/11 0504 04/24/11 0501  NA 136 138  K 4.5 4.9  CL 106 109  CO2 20 20  GLUCOSE 120* 135*  BUN 37* 27*  CREATININE 2.16* 1.42*  CALCIUM 9.4 10.2   LFT  Basename 04/26/11 0504  PROT 6.3  ALBUMIN 2.6*  AST 17  ALT 13  ALKPHOS 101  BILITOT 0.3  BILIDIR --  IBILI --   Medications: I have reviewed the patient's current medications. Current Facility-Administered Medications  Medication Dose Route Frequency Provider Last Rate Last Dose  . acetaminophen (TYLENOL) tablet 650 mg  650 mg Oral Q6H PRN      Or  . acetaminophen (TYLENOL) suppository 650 mg  650 mg Rectal Q6H PRN     . acetaminophen (TYLENOL) tablet 650 mg  650 mg Oral Once     . albuterol (PROVENTIL) (5 MG/ML) 0.5% nebulizer solution 2.5 mg  2.5 mg Nebulization Q2H PRN     . alum & mag hydroxide-simeth (MAALOX/MYLANTA) 200-200-20 MG/5ML suspension 30 mL  30 mL Oral Q6H PRN     . amLODipine (NORVASC) tablet 10 mg  10 mg Oral Daily     . antiseptic oral rinse (BIOTENE) solution   Mouth Rinse TID PC     . chlorhexidine (PERIDEX) 0.12 % solution 15 mL  15 mL  Mouth/Throat BID     . folic acid (FOLVITE) tablet 1 mg  1 mg Oral Daily     . guaiFENesin-dextromethorphan (ROBITUSSIN DM) 100-10 MG/5ML syrup 5 mL  5 mL Oral Q4H PRN     . hydrALAZINE (APRESOLINE) injection 20 mg  20 mg Intravenous Q4H PRN     . HYDROcodone-acetaminophen (NORCO) 5-325 MG per tablet 1-2 tablet  1-2 tablet Oral Q4H PRN     . insulin aspart (novoLOG) injection 0-9 Units  0-9 Units Subcutaneous TID WC     .         Marland Kitchen         . magic mouthwash  5 mL Oral TID BM     . meperidine (DEMEROL) 100 MG/ML injection         . metoprolol (LOPRESSOR) tablet 100 mg  100 mg Oral BID     . midazolam (VERSED) 5 MG/5ML injection         .         .            .         . pantoprazole (PROTONIX) EC tablet 40 mg  40 mg Oral BID AC     .         .         .         Marland Kitchen  Tamsulosin HCl (FLOMAX) capsule 0.4 mg  0.4 mg Oral Daily     .         .         .         .         .            Assessment/Plan: PT ADMITTED WITH R HUMERUS FRACTURE AND ANEMIA WHILE ON COUMADIN. PT HAS RENAL INSUFFICIENCY. ANEMIA IS MULTIFACTORIAL. TCS/EGD 10/30. NO ACTIVE GI BLEED.  PLAN: BID PPI FOR 3 MOS THEN DAILY FOREVER. CONTINUE ASA. RESTART COUMADININ 11/5. MONITOR CBC. OPV WITJ DR. Londa Mackowski IN 1 MO.  LOS: 7 days   Jashaun Penrose 04/26/2011, 9:47 AM

## 2011-04-26 NOTE — Progress Notes (Signed)
Pt d/c today by MD and [facility] received authorization for SNF.  Pt, pt's son, and facility aware and agreeable.  Pt's daughter to arrange transport for pt via [facility].   Karn Cassis

## 2011-04-26 NOTE — Progress Notes (Signed)
UR Chart Review Completed  

## 2011-04-26 NOTE — Progress Notes (Signed)
Physical Therapy Treatment Patient Details Name: [name] MRN: [id number] DOB: September 11, 1926 Today's Date: 04/26/2011 TIME: 1125-1150/ TE-19mins GT  PT Assessment/Plan  PT - Assessment/Plan Comments on Treatment Session: Pt completed all exercises without difficutly;pt was able to ambulate with a hemiwalker a total of 47'; 20' with seated rest break and then additional 17'; pt tend to have right lateral lean which makes his balance unsteady at times, so he is at high risk for falls corrrection total of 37' not 47' PT Goals  Acute Rehab PT Goals PT Goal: Supine/Side to Sit - Progress: Progressing toward goal PT Transfer Goal: Sit to Stand/Stand to Sit - Progress: Progressing toward goal PT Goal: Ambulate - Progress: Progressing toward goal  PT Treatment Precautions/Restrictions  Precautions Precautions: Fall Type of Shoulder Precautions: Right shoulder in sling Required Braces or Orthoses: Yes Other Brace/Splint: sling for RUE Restrictions Weight Bearing Restrictions: No Mobility (including Balance) Bed Mobility Rolling Left: 4: Min assist Left Sidelying to Sit: 4: Min assist;HOB elevated (comment degrees) (50 degrees) Left Sidelying to Sit Details (indicate cue type and reason): Vcs to use rail;assistance needed wtih trunk Sitting - Scoot to Edge of Bed: 6: Modified independent (Device/Increase time) Transfers Transfers: Yes Sit to Stand: 4: Min assist Sit to Stand Details (indicate cue type and reason): elevated surface; assistance with steadiness Stand to Sit: 4: Min assist Stand to Sit Details: VCs for reaching back to control descent Ambulation/Gait Ambulation/Gait: Yes Ambulation/Gait Assistance: 4: Min assist Ambulation/Gait Assistance Details (indicate cue type and reason): hemi walker Ambulation Distance (Feet): 47 Feet (20' seated rest break-additional 17' ) Assistive device: Hemi-walker Gait Pattern: Lateral trunk lean to right;Decreased dorsiflexion -  right;Decreased dorsiflexion - left;Shuffle Gait velocity: normal Stairs: No Wheelchair Mobility Wheelchair Mobility: No    Exercise  General Exercises - Upper Extremity Elbow Flexion: AAROM;Right;10 reps Elbow Extension: AAROM;Right;10 reps Wrist Flexion: AROM;Right;10 reps Wrist Extension: 10 reps;Right;AROM Digit Composite Flexion: AROM;Right;10 reps Composite Extension: AROM;Right;10 reps General Exercises - Lower Extremity Ankle Circles/Pumps: Both;10 reps Quad Sets: 10 reps;Both Long Arc Quad: Both;10 reps Hip ABduction/ADduction: Both;10 reps Hip Flexion/Marching: Both;10 reps Toe Raises: 20 reps;Both Heel Raises: Both (20 reps) End of Session PT - End of Session Equipment Utilized During Treatment: Gait belt (hemiwalker) Activity Tolerance: Patient tolerated treatment well Patient left: in bed;with call bell in reach;with bed alarm set (nursing present) General Behavior During Session: [facility] for tasks performed Cognition: [facility] for tasks performed  Kelseigh Diver ATKINSO 04/26/2011, 12:01 PM

## 2011-05-01 ENCOUNTER — Telehealth: Payer: Self-pay | Admitting: Gastroenterology

## 2011-05-01 ENCOUNTER — Encounter (HOSPITAL_COMMUNITY): Payer: Self-pay | Admitting: Gastroenterology

## 2011-05-01 NOTE — Telephone Encounter (Signed)
Reminder in epic to have egd in 3 years

## 2011-05-01 NOTE — Telephone Encounter (Signed)
Please call pt.HE HAS BARRETT'S BUT NO HIGH RISK FEATURES. His stomach Bx shows mild gastritis. Continue PROTONIX. HE had simple adenomas removed from her colon. EGD IN 3 YEARS IF THE BENEFITS OUTWEIGH THE RISKS. FOLLOW A HIGH FIBER DIET.

## 2011-05-02 NOTE — Telephone Encounter (Signed)
[name] returned call. I informed of all the results and info. She said to please let his nurse/doctor at [facility] know. He is scheduled for a follow-up appt on 06/13/2011 at 10:00 with Dr. Darrick Penna.

## 2011-05-02 NOTE — Telephone Encounter (Signed)
Called home number listed. Male asked me to call pt's daughter, Mort Sawyers @ [phone]. Called, [facility] for a return call.

## 2011-05-03 ENCOUNTER — Ambulatory Visit (INDEPENDENT_AMBULATORY_CARE_PROVIDER_SITE_OTHER): Payer: Medicare PPO | Admitting: Orthopedic Surgery

## 2011-05-03 ENCOUNTER — Telehealth: Payer: Self-pay | Admitting: Orthopedic Surgery

## 2011-05-03 ENCOUNTER — Encounter: Payer: Self-pay | Admitting: Orthopedic Surgery

## 2011-05-03 VITALS — BP 80/50 | Ht 69.0 in | Wt 208.0 lb

## 2011-05-03 DIAGNOSIS — S42209A Unspecified fracture of upper end of unspecified humerus, initial encounter for closed fracture: Secondary | ICD-10-CM

## 2011-05-03 NOTE — Progress Notes (Signed)
  This is a hospital followup status post consultation for RIGHT proximal humerus fracture. He is here for his 1st hospital x-ray.  He is in a sling [facility].   Consults Notes:  xrays show non displaced proximal humerus fracture  Sling x 2 weeks then OT x 6 weeks  F/u in office in 1 week for shoulder xray  Fuller Canada  04/24/2011, 7:54 AM   Separate x-ray report [facility], lateral, of RIGHT shoulder. There is a proximal humerus fracture, which is impacted. IMPRESSION: IMPACTED RIGHT HUMERUS FRACTURE   However, he will get another film at the hospital just to get a better lateral film, although things look to be in stable impacted position.  Follow up in 6 weeks. He will wear the sling for an additional 1-2 weeks and start therapy 3 times a week.

## 2011-05-03 NOTE — Patient Instructions (Signed)
OT/ PT X 6 WEEKS   SLING X 2 MORE WEEKS

## 2011-05-03 NOTE — Telephone Encounter (Signed)
Ok cancel new order

## 2011-05-03 NOTE — Telephone Encounter (Signed)
Called and informed pt's nurse, Angela Nevin. I am faxing a copy of the info. Pt also has a follow up appt to see Dr. Darrick Penna on 06/13/2011  10:00 AM.  Faxing to [phone].

## 2011-05-03 NOTE — Telephone Encounter (Signed)
Received call from [facility], Ms. Norm Salt, rehab manager, ph# [phone], regarding the physical therapy order dated today.    She states they "already have picked up" patient and he is in therapy 5 days per week for 4 weeks, per hospital discharge.  She needs to verify today's order for 3 days per week X6 weeks, and needs to know if it is to include AROM, PROM, as said this was not checked off on order. Please advise.

## 2011-05-04 ENCOUNTER — Other Ambulatory Visit: Payer: Self-pay | Admitting: Orthopedic Surgery

## 2011-05-04 ENCOUNTER — Ambulatory Visit (HOSPITAL_COMMUNITY)
Admission: RE | Admit: 2011-05-04 | Discharge: 2011-05-04 | Disposition: A | Discharge status: Home / Self Care | Payer: Medicare PPO | Source: Ambulatory Visit | Attending: Orthopedic Surgery | Admitting: Orthopedic Surgery

## 2011-05-04 DIAGNOSIS — T148XXA Other injury of unspecified body region, initial encounter: Secondary | ICD-10-CM

## 2011-05-04 DIAGNOSIS — IMO0001 Reserved for inherently not codable concepts without codable children: Secondary | ICD-10-CM | POA: Insufficient documentation

## 2011-05-04 NOTE — Telephone Encounter (Signed)
Called back to [facility], relayed per Dr. Romeo Apple; spoke with French Ana, physical therapy assistant..  Faxed note to [facility] [phone]

## 2011-05-07 ENCOUNTER — Emergency Department (HOSPITAL_COMMUNITY): Payer: Medicare PPO

## 2011-05-07 ENCOUNTER — Encounter (HOSPITAL_COMMUNITY): Payer: Self-pay | Admitting: Emergency Medicine

## 2011-05-07 ENCOUNTER — Emergency Department (HOSPITAL_COMMUNITY)
Admission: EM | Admit: 2011-05-07 | Discharge: 2011-05-08 | Disposition: A | Discharge status: Skilled Nursing Facility | Payer: Medicare PPO | Attending: Emergency Medicine | Admitting: Emergency Medicine

## 2011-05-07 DIAGNOSIS — R404 Transient alteration of awareness: Secondary | ICD-10-CM | POA: Insufficient documentation

## 2011-05-07 DIAGNOSIS — F0390 Unspecified dementia without behavioral disturbance: Secondary | ICD-10-CM | POA: Insufficient documentation

## 2011-05-07 DIAGNOSIS — F29 Unspecified psychosis not due to a substance or known physiological condition: Secondary | ICD-10-CM | POA: Insufficient documentation

## 2011-05-07 DIAGNOSIS — R1110 Vomiting, unspecified: Secondary | ICD-10-CM | POA: Insufficient documentation

## 2011-05-07 DIAGNOSIS — Z860100 Personal history of colon polyps, unspecified: Secondary | ICD-10-CM | POA: Insufficient documentation

## 2011-05-07 DIAGNOSIS — Z8673 Personal history of transient ischemic attack (TIA), and cerebral infarction without residual deficits: Secondary | ICD-10-CM | POA: Insufficient documentation

## 2011-05-07 DIAGNOSIS — E119 Type 2 diabetes mellitus without complications: Secondary | ICD-10-CM | POA: Insufficient documentation

## 2011-05-07 DIAGNOSIS — R5381 Other malaise: Secondary | ICD-10-CM | POA: Insufficient documentation

## 2011-05-07 DIAGNOSIS — K2270 Barrett's esophagus without dysplasia: Secondary | ICD-10-CM | POA: Insufficient documentation

## 2011-05-07 DIAGNOSIS — M129 Arthropathy, unspecified: Secondary | ICD-10-CM | POA: Insufficient documentation

## 2011-05-07 DIAGNOSIS — Z862 Personal history of diseases of the blood and blood-forming organs and certain disorders involving the immune mechanism: Secondary | ICD-10-CM | POA: Insufficient documentation

## 2011-05-07 DIAGNOSIS — J9819 Other pulmonary collapse: Secondary | ICD-10-CM | POA: Insufficient documentation

## 2011-05-07 LAB — DIFFERENTIAL
Eosinophils Relative: 1 % (ref 0–5)
Lymphs Abs: 1 10*3/uL (ref 0.7–4.0)
Monocytes Relative: 10 % (ref 3–12)
Neutro Abs: 3.8 10*3/uL (ref 1.7–7.7)

## 2011-05-07 LAB — URINALYSIS, ROUTINE W REFLEX MICROSCOPIC
Bilirubin Urine: NEGATIVE
Hgb urine dipstick: NEGATIVE
Nitrite: NEGATIVE
Specific Gravity, Urine: 1.025 (ref 1.005–1.030)
pH: 5 (ref 5.0–8.0)

## 2011-05-07 LAB — BASIC METABOLIC PANEL
BUN: 36 mg/dL — ABNORMAL HIGH (ref 6–23)
Chloride: 108 mEq/L (ref 96–112)
GFR calc Af Amer: 44 mL/min — ABNORMAL LOW (ref >90)
Glucose, Bld: 183 mg/dL — ABNORMAL HIGH (ref 70–99)
Potassium: 4.6 mEq/L (ref 3.5–5.1)
Sodium: 140 mEq/L (ref 135–145)

## 2011-05-07 LAB — CBC
HCT: 30.5 % — ABNORMAL LOW (ref 39.0–52.0)
Hemoglobin: 9.7 g/dL — ABNORMAL LOW (ref 13.0–17.0)
MCV: 91 fL (ref 78.0–100.0)
RBC: 3.35 MIL/uL — ABNORMAL LOW (ref 4.22–5.81)
WBC: 5.5 10*3/uL (ref 4.0–10.5)

## 2011-05-07 LAB — GLUCOSE, CAPILLARY: Glucose-Capillary: 168 mg/dL — ABNORMAL HIGH (ref 70–99)

## 2011-05-07 NOTE — ED Notes (Signed)
Someone from [facility] called NS to check on plan of care for pt NS responded that pt was going to be returned back to [facility] and RN had been attempting call report; person did not hold on for report; RN immediately called back to attempt to give report no answer received.

## 2011-05-07 NOTE — ED Notes (Signed)
Attempted to call report to [facility] x 3; No anwer will call EMS to transport pt

## 2011-05-07 NOTE — ED Notes (Signed)
Attempted to call report x 2 once again for pt to return no answer will give full report to EMS upon arrival

## 2011-05-07 NOTE — ED Notes (Signed)
Receive report assessment unchanged pt A&0 x 2

## 2011-05-07 NOTE — ED Provider Notes (Signed)
Medical screening examination/treatment/procedure(s) were performed by non-physician practitioner and as supervising physician I was immediately available for consultation/collaboration.   [name], MD 05/07/11 2322

## 2011-05-07 NOTE — ED Notes (Signed)
Patient brought in by EMS. Alert and oriented. Patient from [facility]. Per EMS called out for AMS, nausea and vomiting. Patient does report vomiting once. Denies any nausea at this time. Per EMS patient did not want to come to hospital but family request he be brought here. Patient reports pain in right shoulder. Per patient and records patient fell 2 months ago and fractured shoulder. Patient denies any other pain.
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
## 2011-05-09 NOTE — Telephone Encounter (Signed)
Results Cc to PCP  

## 2011-06-13 ENCOUNTER — Telehealth: Payer: Self-pay | Admitting: Gastroenterology

## 2011-06-13 ENCOUNTER — Encounter: Payer: Medicare PPO | Admitting: Gastroenterology

## 2011-06-13 NOTE — Telephone Encounter (Signed)
Pt was a no show

## 2011-06-13 NOTE — Telephone Encounter (Signed)
CALL [facility]. PT MISSED [facility]. OPV WITHIN THE NEXT 1-2 MOS E 30 VISIT

## 2011-06-13 NOTE — Progress Notes (Signed)
error 

## 2011-06-18 ENCOUNTER — Emergency Department (HOSPITAL_COMMUNITY)
Admission: EM | Admit: 2011-06-18 | Discharge: 2011-06-18 | Disposition: A | Discharge status: Home / Self Care | Payer: Medicare PPO | Attending: Emergency Medicine | Admitting: Emergency Medicine

## 2011-06-18 ENCOUNTER — Emergency Department (HOSPITAL_COMMUNITY): Payer: Medicare PPO

## 2011-06-18 ENCOUNTER — Encounter (HOSPITAL_COMMUNITY): Payer: Self-pay

## 2011-06-18 DIAGNOSIS — E119 Type 2 diabetes mellitus without complications: Secondary | ICD-10-CM | POA: Insufficient documentation

## 2011-06-18 DIAGNOSIS — F0390 Unspecified dementia without behavioral disturbance: Secondary | ICD-10-CM | POA: Insufficient documentation

## 2011-06-18 DIAGNOSIS — R0602 Shortness of breath: Secondary | ICD-10-CM | POA: Insufficient documentation

## 2011-06-18 DIAGNOSIS — S91109A Unspecified open wound of unspecified toe(s) without damage to nail, initial encounter: Secondary | ICD-10-CM | POA: Insufficient documentation

## 2011-06-18 DIAGNOSIS — Z8679 Personal history of other diseases of the circulatory system: Secondary | ICD-10-CM | POA: Insufficient documentation

## 2011-06-18 DIAGNOSIS — S92501B Displaced unspecified fracture of right lesser toe(s), initial encounter for open fracture: Secondary | ICD-10-CM

## 2011-06-18 DIAGNOSIS — S92919B Unspecified fracture of unspecified toe(s), initial encounter for open fracture: Secondary | ICD-10-CM | POA: Insufficient documentation

## 2011-06-18 DIAGNOSIS — X58XXXA Exposure to other specified factors, initial encounter: Secondary | ICD-10-CM | POA: Insufficient documentation

## 2011-06-18 DIAGNOSIS — Y92009 Unspecified place in unspecified non-institutional (private) residence as the place of occurrence of the external cause: Secondary | ICD-10-CM | POA: Insufficient documentation

## 2011-06-18 MED ORDER — TETANUS-DIPHTH-ACELL PERTUSSIS 5-2.5-18.5 LF-MCG/0.5 IM SUSP
0.5000 mL | Freq: Once | INTRAMUSCULAR | Status: AC
  Administered 2011-06-18: 0.5 mL via INTRAMUSCULAR
  Filled 2011-06-18: qty 0.5

## 2011-06-18 MED ORDER — BACITRACIN-NEOMYCIN-POLYMYXIN 400-5-5000 EX OINT
TOPICAL_OINTMENT | Freq: Once | CUTANEOUS | Status: AC
  Administered 2011-06-18: 1 via TOPICAL
  Filled 2011-06-18: qty 1

## 2011-06-18 MED ORDER — CEPHALEXIN 500 MG PO CAPS: 500.0000 mg | ORAL_CAPSULE | Freq: Four times a day (QID) | ORAL | Status: AC

## 2011-06-18 MED ORDER — LIDOCAINE HCL (PF) 1 % IJ SOLN
5.0000 mL | Freq: Once | INTRAMUSCULAR | Status: AC
  Administered 2011-06-18: 5 mL
  Filled 2011-06-18: qty 5

## 2011-06-18 MED ORDER — CEPHALEXIN 500 MG PO CAPS
500.0000 mg | ORAL_CAPSULE | Freq: Once | ORAL | Status: AC
  Administered 2011-06-18: 500 mg via ORAL
  Filled 2011-06-18: qty 1

## 2011-06-18 NOTE — ED Provider Notes (Signed)
Patient states he fell and injured his right little toe today. Patient has a laceration at the base of his right little toe on the sole side from the mid toe extends laterally.   Medical screening examination/treatment/procedure(s) were conducted as a shared visit with non-physician practitioner(s) and myself.  I personally evaluated the patient during the encounter Devoria Albe, MD, Franz Dell, MD 06/18/11 2011
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
## 2011-06-18 NOTE — ED Notes (Signed)
Pt presents with laceration to right pinky toe. Son brought him in and states he is concerned because pt is diabetic and worries because pt has fungus on feet.

## 2011-06-18 NOTE — ED Provider Notes (Signed)
See prior note   [name] L Kazandra Forstrom, MD 06/18/11 2356 

## 2011-06-20 ENCOUNTER — Encounter: Payer: Self-pay | Admitting: Gastroenterology

## 2011-06-20 NOTE — Telephone Encounter (Signed)
[facility] tells me that patient is no longer there. I will mail appt card to patient's address on file. OV will be 07/26/11 at 10 am with [facility]

## 2011-06-30 ENCOUNTER — Emergency Department (HOSPITAL_COMMUNITY): Payer: Medicare PPO

## 2011-06-30 ENCOUNTER — Encounter (HOSPITAL_COMMUNITY): Payer: Self-pay | Admitting: Emergency Medicine

## 2011-06-30 ENCOUNTER — Other Ambulatory Visit: Payer: Self-pay

## 2011-06-30 ENCOUNTER — Emergency Department (HOSPITAL_COMMUNITY)
Admission: EM | Admit: 2011-06-30 | Discharge: 2011-07-01 | Disposition: A | Discharge status: Home / Self Care | Payer: Medicare PPO | Attending: Emergency Medicine | Admitting: Emergency Medicine

## 2011-06-30 DIAGNOSIS — F0390 Unspecified dementia without behavioral disturbance: Secondary | ICD-10-CM | POA: Insufficient documentation

## 2011-06-30 DIAGNOSIS — R609 Edema, unspecified: Secondary | ICD-10-CM | POA: Insufficient documentation

## 2011-06-30 DIAGNOSIS — I440 Atrioventricular block, first degree: Secondary | ICD-10-CM | POA: Insufficient documentation

## 2011-06-30 DIAGNOSIS — R42 Dizziness and giddiness: Secondary | ICD-10-CM | POA: Insufficient documentation

## 2011-06-30 DIAGNOSIS — R55 Syncope and collapse: Secondary | ICD-10-CM | POA: Insufficient documentation

## 2011-06-30 DIAGNOSIS — Z860100 Personal history of colon polyps, unspecified: Secondary | ICD-10-CM | POA: Insufficient documentation

## 2011-06-30 DIAGNOSIS — E119 Type 2 diabetes mellitus without complications: Secondary | ICD-10-CM | POA: Insufficient documentation

## 2011-06-30 DIAGNOSIS — Z800 Family history of malignant neoplasm of digestive organs: Secondary | ICD-10-CM | POA: Insufficient documentation

## 2011-06-30 DIAGNOSIS — Z8673 Personal history of transient ischemic attack (TIA), and cerebral infarction without residual deficits: Secondary | ICD-10-CM | POA: Insufficient documentation

## 2011-06-30 DIAGNOSIS — R404 Transient alteration of awareness: Secondary | ICD-10-CM | POA: Insufficient documentation

## 2011-06-30 DIAGNOSIS — M129 Arthropathy, unspecified: Secondary | ICD-10-CM | POA: Insufficient documentation

## 2011-06-30 DIAGNOSIS — F17200 Nicotine dependence, unspecified, uncomplicated: Secondary | ICD-10-CM | POA: Insufficient documentation

## 2011-06-30 DIAGNOSIS — I4510 Unspecified right bundle-branch block: Secondary | ICD-10-CM | POA: Insufficient documentation

## 2011-06-30 LAB — URINALYSIS, ROUTINE W REFLEX MICROSCOPIC
Glucose, UA: NEGATIVE mg/dL
Leukocytes, UA: NEGATIVE
Protein, ur: NEGATIVE mg/dL
Specific Gravity, Urine: 1.015 (ref 1.005–1.030)
Urobilinogen, UA: 0.2 mg/dL (ref 0.0–1.0)

## 2011-06-30 LAB — DIFFERENTIAL
Basophils Relative: 0 % (ref 0–1)
Eosinophils Absolute: 0 10*3/uL (ref 0.0–0.7)
Eosinophils Relative: 1 % (ref 0–5)
Monocytes Absolute: 0.4 10*3/uL (ref 0.1–1.0)
Monocytes Relative: 7 % (ref 3–12)

## 2011-06-30 LAB — COMPREHENSIVE METABOLIC PANEL
Albumin: 3.1 g/dL — ABNORMAL LOW (ref 3.5–5.2)
BUN: 18 mg/dL (ref 6–23)
Calcium: 10.2 mg/dL (ref 8.4–10.5)
Chloride: 111 mEq/L (ref 96–112)
Creatinine, Ser: 1.58 mg/dL — ABNORMAL HIGH (ref 0.50–1.35)
Total Bilirubin: 0.2 mg/dL — ABNORMAL LOW (ref 0.3–1.2)

## 2011-06-30 LAB — CBC
HCT: 28.6 % — ABNORMAL LOW (ref 39.0–52.0)
Hemoglobin: 9.3 g/dL — ABNORMAL LOW (ref 13.0–17.0)
MCH: 29.2 pg (ref 26.0–34.0)
MCHC: 32.5 g/dL (ref 30.0–36.0)
MCV: 89.9 fL (ref 78.0–100.0)
RDW: 14.4 % (ref 11.5–15.5)

## 2011-06-30 MED ORDER — DOUBLE ANTIBIOTIC 500-10000 UNIT/GM EX OINT
TOPICAL_OINTMENT | Freq: Once | CUTANEOUS | Status: DC
  Filled 2011-06-30: qty 1

## 2011-06-30 MED ORDER — SODIUM CHLORIDE 0.9 % IV BOLUS (SEPSIS): 500.0000 mL | Freq: Once | INTRAVENOUS | Status: DC

## 2011-06-30 NOTE — ED Notes (Signed)
Pt brought in by EMS, family reports syncopal episode. Pt was reported to have been in the bathroom with incontinent stool and emesis.
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
## 2011-07-01 NOTE — ED Notes (Signed)
Pt stable at discharge with no complaints. 

## 2011-07-01 NOTE — ED Notes (Signed)
Pt's son called and is enroute to pick up pt

## 2011-07-03 ENCOUNTER — Ambulatory Visit: Payer: Medicare PPO | Admitting: Orthopedic Surgery

## 2011-07-26 ENCOUNTER — Ambulatory Visit: Payer: Medicare PPO | Admitting: Gastroenterology

## 2011-07-26 ENCOUNTER — Telehealth: Payer: Self-pay | Admitting: Gastroenterology

## 2011-07-26 NOTE — Telephone Encounter (Signed)
Pt was a no show

## 2011-07-26 NOTE — Telephone Encounter (Signed)
[name] @ [phone]. PT HAS MISSED 2 APPTS.
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
## 2011-08-13 ENCOUNTER — Encounter: Payer: Self-pay | Admitting: Gastroenterology

## 2011-08-13 NOTE — Telephone Encounter (Signed)
I still have not received a reply back from patient or family member. I mailed letter to son's address for someone to call an set up OV for patient.

## 2011-10-11 ENCOUNTER — Encounter (HOSPITAL_COMMUNITY): Payer: Self-pay | Admitting: *Deleted

## 2011-10-11 ENCOUNTER — Emergency Department (HOSPITAL_COMMUNITY)
Admission: EM | Admit: 2011-10-11 | Discharge: 2011-10-12 | Disposition: A | Discharge status: Home / Self Care | Payer: Medicare PPO | Attending: Emergency Medicine | Admitting: Emergency Medicine

## 2011-10-11 ENCOUNTER — Emergency Department (HOSPITAL_COMMUNITY): Payer: Medicare PPO

## 2011-10-11 DIAGNOSIS — F17200 Nicotine dependence, unspecified, uncomplicated: Secondary | ICD-10-CM | POA: Insufficient documentation

## 2011-10-11 DIAGNOSIS — R079 Chest pain, unspecified: Secondary | ICD-10-CM | POA: Insufficient documentation

## 2011-10-11 DIAGNOSIS — S2249XA Multiple fractures of ribs, unspecified side, initial encounter for closed fracture: Secondary | ICD-10-CM | POA: Insufficient documentation

## 2011-10-11 DIAGNOSIS — Z8679 Personal history of other diseases of the circulatory system: Secondary | ICD-10-CM | POA: Insufficient documentation

## 2011-10-11 DIAGNOSIS — E119 Type 2 diabetes mellitus without complications: Secondary | ICD-10-CM | POA: Insufficient documentation

## 2011-10-11 DIAGNOSIS — W19XXXA Unspecified fall, initial encounter: Secondary | ICD-10-CM | POA: Insufficient documentation

## 2011-10-11 DIAGNOSIS — F0390 Unspecified dementia without behavioral disturbance: Secondary | ICD-10-CM | POA: Insufficient documentation

## 2011-10-11 DIAGNOSIS — S2239XA Fracture of one rib, unspecified side, initial encounter for closed fracture: Secondary | ICD-10-CM

## 2011-10-11 NOTE — ED Notes (Signed)
Fall, family member states he has been eating epsom salt, pain in right rib cage
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
## 2011-10-12 LAB — COMPREHENSIVE METABOLIC PANEL
ALT: 10 U/L (ref 0–53)
AST: 13 U/L (ref 0–37)
CO2: 22 mEq/L (ref 19–32)
Calcium: 10.2 mg/dL (ref 8.4–10.5)
Sodium: 138 mEq/L (ref 135–145)
Total Protein: 7.2 g/dL (ref 6.0–8.3)

## 2011-10-12 LAB — URINALYSIS, MICROSCOPIC ONLY
Bilirubin Urine: NEGATIVE
Hgb urine dipstick: NEGATIVE
Specific Gravity, Urine: 1.025 (ref 1.005–1.030)
pH: 5 (ref 5.0–8.0)

## 2011-10-12 MED ORDER — BACITRACIN 500 UNIT/GM EX OINT
1.0000 "application " | TOPICAL_OINTMENT | Freq: Two times a day (BID) | CUTANEOUS | Status: DC
  Administered 2011-10-12: 1 via TOPICAL
  Filled 2011-10-12 (×5): qty 0.9

## 2011-10-12 MED ORDER — NAPROXEN 500 MG PO TABS: 500.0000 mg | ORAL_TABLET | Freq: Two times a day (BID) | ORAL | Status: DC

## 2011-10-12 MED ORDER — NAPROXEN 250 MG PO TABS
500.0000 mg | ORAL_TABLET | Freq: Once | ORAL | Status: AC
  Administered 2011-10-12: 500 mg via ORAL
  Filled 2011-10-12: qty 2

## 2011-10-12 NOTE — Discharge Instructions (Signed)
Rib Fracture Your caregiver has diagnosed you as having a rib fracture (a break). This can occur by a blow to the chest, by a fall against a hard object, or by violent coughing or sneezing. There may be one or many breaks. Rib fractures may heal on their own within 3 to 8 weeks. The longer healing period is usually associated with a continued cough or other aggravating activities. HOME CARE INSTRUCTIONS   Avoid strenuous activity. Be careful during activities and avoid bumping the injured rib. Activities that cause pain pull on the fracture site(s) and are best avoided if possible.   Eat a normal, well-balanced diet. Drink plenty of fluids to avoid constipation.   Take deep breaths several times a day to keep lungs free of infection. Try to cough several times a day, splinting the injured area with a pillow. This will help prevent pneumonia.   Do not wear a rib belt or binder. These restrict breathing which can lead to pneumonia.   Only take over-the-counter or prescription medicines for pain, discomfort, or fever as directed by your caregiver.  SEEK MEDICAL CARE IF:  You develop a continual cough, associated with thick or bloody sputum. SEEK IMMEDIATE MEDICAL CARE IF:   You have a fever.   You have difficulty breathing.   You have nausea (feeling sick to your stomach), vomiting, or abdominal (belly) pain.   You have worsening pain, not controlled with medications.  Document Released: 06/11/2005 Document Revised: 05/31/2011 Document Reviewed: 11/13/2006 [employer] Patient Information 2012 [employer]. 

## 2011-10-12 NOTE — ED Notes (Signed)
Discharge instructions reviewed with pt, questions answered. Pt verbalized understanding.  

## 2011-12-18 ENCOUNTER — Emergency Department (HOSPITAL_COMMUNITY): Payer: Medicare PPO

## 2011-12-18 ENCOUNTER — Encounter (HOSPITAL_COMMUNITY): Payer: Self-pay | Admitting: Radiology

## 2011-12-18 ENCOUNTER — Emergency Department (HOSPITAL_COMMUNITY)
Admission: EM | Admit: 2011-12-18 | Discharge: 2011-12-19 | Disposition: A | Discharge status: Home / Self Care | Payer: Medicare PPO | Attending: Emergency Medicine | Admitting: Emergency Medicine

## 2011-12-18 DIAGNOSIS — S2249XA Multiple fractures of ribs, unspecified side, initial encounter for closed fracture: Secondary | ICD-10-CM | POA: Insufficient documentation

## 2011-12-18 DIAGNOSIS — R4182 Altered mental status, unspecified: Secondary | ICD-10-CM | POA: Insufficient documentation

## 2011-12-18 DIAGNOSIS — S2239XA Fracture of one rib, unspecified side, initial encounter for closed fracture: Secondary | ICD-10-CM

## 2011-12-18 DIAGNOSIS — F0390 Unspecified dementia without behavioral disturbance: Secondary | ICD-10-CM | POA: Insufficient documentation

## 2011-12-18 DIAGNOSIS — E1169 Type 2 diabetes mellitus with other specified complication: Secondary | ICD-10-CM | POA: Insufficient documentation

## 2011-12-18 DIAGNOSIS — Z8739 Personal history of other diseases of the musculoskeletal system and connective tissue: Secondary | ICD-10-CM | POA: Insufficient documentation

## 2011-12-18 DIAGNOSIS — Z8673 Personal history of transient ischemic attack (TIA), and cerebral infarction without residual deficits: Secondary | ICD-10-CM | POA: Insufficient documentation

## 2011-12-18 DIAGNOSIS — Z79899 Other long term (current) drug therapy: Secondary | ICD-10-CM | POA: Insufficient documentation

## 2011-12-18 DIAGNOSIS — E162 Hypoglycemia, unspecified: Secondary | ICD-10-CM

## 2011-12-18 DIAGNOSIS — W19XXXA Unspecified fall, initial encounter: Secondary | ICD-10-CM | POA: Insufficient documentation

## 2011-12-18 LAB — URINALYSIS, ROUTINE W REFLEX MICROSCOPIC
Bilirubin Urine: NEGATIVE
Ketones, ur: NEGATIVE mg/dL
Nitrite: NEGATIVE
Urobilinogen, UA: 0.2 mg/dL (ref 0.0–1.0)

## 2011-12-18 MED ORDER — GLUCOSE 40 % PO GEL
ORAL | Status: AC
  Administered 2011-12-18: 23:00:00
  Filled 2011-12-18: qty 1

## 2011-12-18 MED ORDER — DEXTROSE 50 % IV SOLN
INTRAVENOUS | Status: AC
  Administered 2011-12-18: 23:00:00
  Filled 2011-12-18: qty 50

## 2011-12-18 MED ORDER — GLUCAGON HCL (RDNA) 1 MG IJ SOLR
INTRAMUSCULAR | Status: AC
  Administered 2011-12-18: 23:00:00
  Filled 2011-12-18: qty 1

## 2011-12-18 NOTE — ED Notes (Signed)
Pt. Transported to ct scan
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
## 2011-12-18 NOTE — ED Notes (Signed)
C/o altered mental staus, pt pale, slurred speech, CBG 23, given one amp d50 and oral glucose. IV started. Pt pale. Alert after amp of d50 and oral glucose.

## 2011-12-18 NOTE — ED Notes (Signed)
[place] sandwich / apple sauce / sprite given to pt. Per Dr. Preston Fleeting.

## 2011-12-19 LAB — BASIC METABOLIC PANEL
BUN: 24 mg/dL — ABNORMAL HIGH (ref 6–23)
Creatinine, Ser: 1.57 mg/dL — ABNORMAL HIGH (ref 0.50–1.35)
GFR calc Af Amer: 45 mL/min — ABNORMAL LOW (ref >90)
GFR calc non Af Amer: 39 mL/min — ABNORMAL LOW (ref >90)

## 2011-12-19 LAB — GLUCOSE, CAPILLARY
Glucose-Capillary: 23 mg/dL — CL (ref 70–99)
Glucose-Capillary: 93 mg/dL (ref 70–99)
Glucose-Capillary: 99 mg/dL (ref 70–99)

## 2011-12-19 LAB — CBC
HCT: 27.9 % — ABNORMAL LOW (ref 39.0–52.0)
Hemoglobin: 9.2 g/dL — ABNORMAL LOW (ref 13.0–17.0)
MCH: 29.1 pg (ref 26.0–34.0)
MCHC: 33 g/dL (ref 30.0–36.0)

## 2011-12-19 LAB — DIFFERENTIAL
Basophils Relative: 0 % (ref 0–1)
Eosinophils Absolute: 0.1 10*3/uL (ref 0.0–0.7)
Eosinophils Relative: 1 % (ref 0–5)
Monocytes Absolute: 0.4 10*3/uL (ref 0.1–1.0)
Monocytes Relative: 9 % (ref 3–12)

## 2011-12-19 MED ORDER — OXYCODONE-ACETAMINOPHEN 5-325 MG PO TABS: 1.0000 | ORAL_TABLET | Freq: Four times a day (QID) | ORAL | Status: AC | PRN

## 2011-12-19 NOTE — ED Notes (Signed)
CBG- 99 

## 2011-12-19 NOTE — ED Notes (Signed)
DR. Rubin Payor AT BEDSIDE SPEAKING WITH PT. / FAMILY ON RESULTS OF TEST [facility] OF CARE.

## 2011-12-19 NOTE — Discharge Instructions (Signed)
Hypoglycemia (Low Blood Sugar) Hypoglycemia is when the glucose (sugar) in your blood is too low. Hypoglycemia can happen for many reasons. It can happen to people with or without diabetes. Hypoglycemia can develop quickly and can be a medical emergency.  CAUSES  Having hypoglycemia does not mean that you will develop diabetes. Different causes include:  Missed or delayed meals or not enough carbohydrates eaten.   Medication overdose. This could be by accident or deliberate. If by accident, your medication may need to be adjusted or changed.   Exercise or increased activity without adjustments in carbohydrates or medications.   A nerve disorder that affects body functions like your heart rate, blood pressure and digestion (autonomic neuropathy).   A condition where the stomach muscles do not function properly (gastroparesis). Therefore, medications may not absorb properly.   The inability to recognize the signs of hypoglycemia (hypoglycemic unawareness).   Absorption of insulin - may be altered.   Alcohol consumption.   Pregnancy/menstrual cycles/postpartum. This may be due to hormones.   Certain kinds of tumors. This is very rare.  SYMPTOMS   Sweating.   Hunger.   Dizziness.   Blurred vision.   Drowsiness.   Weakness.   Headache.   Rapid heart beat.   Shakiness.   Nervousness.  DIAGNOSIS  Diagnosis is made by monitoring blood glucose in one or all of the following ways:  Fingerstick blood glucose monitoring.   Laboratory results.  TREATMENT  If you think your blood glucose is low:  Check your blood glucose, if possible. If it is less than 70 mg/dl, take one of the following:   3-4 glucose tablets.    cup juice (prefer clear like apple).    cup "regular" soda pop.   1 cup milk.   -1 tube of glucose gel.   5-6 hard candies.   Do not over treat because your blood glucose (sugar) will only go too high.   Wait 15 minutes and recheck your blood  glucose. If it is still less than 70 mg/dl (or below your target range), repeat treatment.   Eat a snack if it is more than one hour until your next meal.  Sometimes, your blood glucose may go so low that you are unable to treat yourself. You may need someone to help you. You may even pass out or be unable to swallow. This may require you to get an injection of glucagon, which raises the blood glucose. HOME CARE INSTRUCTIONS  Check blood glucose as recommended by your caregiver.   Take medication as prescribed by your caregiver.   Follow your meal plan. Do not skip meals. Eat on time.   If you are going to drink alcohol, drink it only with meals.   Check your blood glucose before driving.   Check your blood glucose before and after exercise. If you exercise longer or different than usual, be sure to check blood glucose more frequently.   Always carry treatment with you. Glucose tablets are the easiest to carry.   Always wear medical alert jewelry or carry some form of identification that states that you have diabetes. This will alert people that you have diabetes. If you have hypoglycemia, they will have a better idea on what to do.  SEEK MEDICAL CARE IF:   You are having problems keeping your blood sugar at target range.   You are having frequent episodes of hypoglycemia.   You feel you might be having side effects from your medicines.  You have symptoms of an illness that is not improving after 3-4 days.   You notice a change in vision or a new problem with your vision.  SEEK IMMEDIATE MEDICAL CARE IF:   You are a family member or friend of a person whose blood glucose goes below 70 mg/dl and is accompanied by:   Confusion.   A change in mental status.   The inability to swallow.   Passing out.  Document Released: 06/11/2005 Document Revised: 05/31/2011 Document Reviewed: 02/03/2009 [facility] Patient Information 2012 [place], [place].Rib Fracture Your caregiver has  diagnosed you as having a rib fracture (a break). This can occur by a blow to the chest, by a fall against a hard object, or by violent coughing or sneezing. There may be one or many breaks. Rib fractures may heal on their own within 3 to 8 weeks. The longer healing period is usually associated with a continued cough or other aggravating activities. HOME CARE INSTRUCTIONS   Avoid strenuous activity. Be careful during activities and avoid bumping the injured rib. Activities that cause pain pull on the fracture site(s) and are best avoided if possible.   Eat a normal, well-balanced diet. Drink plenty of fluids to avoid constipation.   Take deep breaths several times a day to keep lungs free of infection. Try to cough several times a day, splinting the injured area with a pillow. This will help prevent pneumonia.   Do not wear a rib belt or binder. These restrict breathing which can lead to pneumonia.   Only take over-the-counter or prescription medicines for pain, discomfort, or fever as directed by your caregiver.  SEEK MEDICAL CARE IF:  You develop a continual cough, associated with thick or bloody sputum. SEEK IMMEDIATE MEDICAL CARE IF:   You have a fever.   You have difficulty breathing.   You have nausea (feeling sick to your stomach), vomiting, or abdominal (belly) pain.   You have worsening pain, not controlled with medications.  Document Released: 06/11/2005 Document Revised: 05/31/2011 Document Reviewed: 11/13/2006 [facility] Patient Information 2012 [place], [place].

## 2011-12-19 NOTE — ED Notes (Signed)
CBG 93 

## 2012-01-08 ENCOUNTER — Inpatient Hospital Stay (HOSPITAL_COMMUNITY)
Admission: EM | Admit: 2012-01-08 | Discharge: 2012-01-11 | DRG: 641 | Disposition: A | Discharge status: Home / Self Care | Payer: Medicare PPO | Attending: Family Medicine | Admitting: Family Medicine

## 2012-01-08 ENCOUNTER — Emergency Department (HOSPITAL_COMMUNITY): Payer: Medicare PPO

## 2012-01-08 ENCOUNTER — Encounter (HOSPITAL_COMMUNITY): Payer: Self-pay | Admitting: Emergency Medicine

## 2012-01-08 DIAGNOSIS — R531 Weakness: Secondary | ICD-10-CM

## 2012-01-08 DIAGNOSIS — N179 Acute kidney failure, unspecified: Secondary | ICD-10-CM

## 2012-01-08 DIAGNOSIS — Z8546 Personal history of malignant neoplasm of prostate: Secondary | ICD-10-CM

## 2012-01-08 DIAGNOSIS — N400 Enlarged prostate without lower urinary tract symptoms: Secondary | ICD-10-CM | POA: Diagnosis present

## 2012-01-08 DIAGNOSIS — I10 Essential (primary) hypertension: Secondary | ICD-10-CM

## 2012-01-08 DIAGNOSIS — Z9181 History of falling: Secondary | ICD-10-CM

## 2012-01-08 DIAGNOSIS — N183 Chronic kidney disease, stage 3 unspecified: Secondary | ICD-10-CM | POA: Diagnosis present

## 2012-01-08 DIAGNOSIS — D649 Anemia, unspecified: Secondary | ICD-10-CM | POA: Diagnosis present

## 2012-01-08 DIAGNOSIS — E1149 Type 2 diabetes mellitus with other diabetic neurological complication: Secondary | ICD-10-CM | POA: Diagnosis present

## 2012-01-08 DIAGNOSIS — M1710 Unilateral primary osteoarthritis, unspecified knee: Secondary | ICD-10-CM | POA: Diagnosis present

## 2012-01-08 DIAGNOSIS — E860 Dehydration: Principal | ICD-10-CM | POA: Diagnosis present

## 2012-01-08 DIAGNOSIS — S2239XA Fracture of one rib, unspecified side, initial encounter for closed fracture: Secondary | ICD-10-CM

## 2012-01-08 DIAGNOSIS — Z8673 Personal history of transient ischemic attack (TIA), and cerebral infarction without residual deficits: Secondary | ICD-10-CM

## 2012-01-08 DIAGNOSIS — I129 Hypertensive chronic kidney disease with stage 1 through stage 4 chronic kidney disease, or unspecified chronic kidney disease: Secondary | ICD-10-CM | POA: Diagnosis present

## 2012-01-08 DIAGNOSIS — G4700 Insomnia, unspecified: Secondary | ICD-10-CM | POA: Diagnosis present

## 2012-01-08 DIAGNOSIS — E1142 Type 2 diabetes mellitus with diabetic polyneuropathy: Secondary | ICD-10-CM | POA: Diagnosis present

## 2012-01-08 DIAGNOSIS — E785 Hyperlipidemia, unspecified: Secondary | ICD-10-CM | POA: Diagnosis present

## 2012-01-08 DIAGNOSIS — K2100 Gastro-esophageal reflux disease with esophagitis, without bleeding: Secondary | ICD-10-CM | POA: Diagnosis present

## 2012-01-08 DIAGNOSIS — K5900 Constipation, unspecified: Secondary | ICD-10-CM | POA: Diagnosis present

## 2012-01-08 DIAGNOSIS — E781 Pure hyperglyceridemia: Secondary | ICD-10-CM | POA: Diagnosis present

## 2012-01-08 DIAGNOSIS — F0390 Unspecified dementia without behavioral disturbance: Secondary | ICD-10-CM

## 2012-01-08 DIAGNOSIS — F17200 Nicotine dependence, unspecified, uncomplicated: Secondary | ICD-10-CM | POA: Diagnosis present

## 2012-01-08 DIAGNOSIS — N289 Disorder of kidney and ureter, unspecified: Secondary | ICD-10-CM

## 2012-01-08 LAB — CBC WITH DIFFERENTIAL/PLATELET
Basophils Absolute: 0 10*3/uL (ref 0.0–0.1)
Basophils Relative: 0 % (ref 0–1)
Eosinophils Relative: 1 % (ref 0–5)
Lymphocytes Relative: 16 % (ref 12–46)
MCHC: 32.4 g/dL (ref 30.0–36.0)
MCV: 90.8 fL (ref 78.0–100.0)
Platelets: 126 10*3/uL — ABNORMAL LOW (ref 150–400)
RDW: 14.9 % (ref 11.5–15.5)
WBC: 5 10*3/uL (ref 4.0–10.5)

## 2012-01-08 LAB — GLUCOSE, CAPILLARY: Glucose-Capillary: 118 mg/dL — ABNORMAL HIGH (ref 70–99)

## 2012-01-08 LAB — COMPREHENSIVE METABOLIC PANEL
ALT: 11 U/L (ref 0–53)
AST: 11 U/L (ref 0–37)
Albumin: 3.4 g/dL — ABNORMAL LOW (ref 3.5–5.2)
CO2: 24 mEq/L (ref 19–32)
Calcium: 10 mg/dL (ref 8.4–10.5)
Creatinine, Ser: 1.7 mg/dL — ABNORMAL HIGH (ref 0.50–1.35)
GFR calc non Af Amer: 35 mL/min — ABNORMAL LOW (ref >90)
Sodium: 140 mEq/L (ref 135–145)
Total Protein: 6.8 g/dL (ref 6.0–8.3)

## 2012-01-08 LAB — URINALYSIS, ROUTINE W REFLEX MICROSCOPIC
Bilirubin Urine: NEGATIVE
Glucose, UA: NEGATIVE mg/dL
Hgb urine dipstick: NEGATIVE
Specific Gravity, Urine: 1.02 (ref 1.005–1.030)
pH: 5.5 (ref 5.0–8.0)

## 2012-01-08 MED ORDER — SODIUM CHLORIDE 0.9 % IV SOLN
INTRAVENOUS | Status: DC
  Administered 2012-01-08 – 2012-01-10 (×3): via INTRAVENOUS

## 2012-01-08 MED ORDER — PANTOPRAZOLE SODIUM 40 MG PO TBEC
40.0000 mg | DELAYED_RELEASE_TABLET | Freq: Two times a day (BID) | ORAL | Status: DC
  Administered 2012-01-09 – 2012-01-11 (×5): 40 mg via ORAL
  Filled 2012-01-08 (×5): qty 1

## 2012-01-08 MED ORDER — AMLODIPINE BESYLATE 5 MG PO TABS
10.0000 mg | ORAL_TABLET | Freq: Every day | ORAL | Status: DC
  Administered 2012-01-09 – 2012-01-11 (×3): 10 mg via ORAL
  Filled 2012-01-08 (×2): qty 2
  Filled 2012-01-08: qty 1
  Filled 2012-01-08: qty 2

## 2012-01-08 MED ORDER — METOPROLOL TARTRATE 50 MG PO TABS
50.0000 mg | ORAL_TABLET | Freq: Two times a day (BID) | ORAL | Status: DC
  Administered 2012-01-09 – 2012-01-11 (×6): 50 mg via ORAL
  Filled 2012-01-08 (×6): qty 1

## 2012-01-08 MED ORDER — TAMSULOSIN HCL 0.4 MG PO CAPS
0.4000 mg | ORAL_CAPSULE | Freq: Every day | ORAL | Status: DC
  Administered 2012-01-09 – 2012-01-10 (×2): 0.4 mg via ORAL
  Filled 2012-01-08 (×3): qty 1

## 2012-01-08 MED ORDER — ACETAMINOPHEN 500 MG PO TABS: 500.0000 mg | ORAL_TABLET | ORAL | Status: DC | PRN

## 2012-01-08 MED ORDER — GLIPIZIDE 5 MG PO TABS
5.0000 mg | ORAL_TABLET | Freq: Every day | ORAL | Status: DC
  Administered 2012-01-09 – 2012-01-10 (×2): 5 mg via ORAL
  Filled 2012-01-08 (×2): qty 1

## 2012-01-08 MED ORDER — CALCIUM CARBONATE-VITAMIN D 500-200 MG-UNIT PO TABS
1.0000 | ORAL_TABLET | Freq: Every day | ORAL | Status: DC
  Administered 2012-01-09 – 2012-01-11 (×3): 1 via ORAL
  Filled 2012-01-08 (×3): qty 1

## 2012-01-08 MED ORDER — SODIUM CHLORIDE 0.9 % IV SOLN: INTRAVENOUS | Status: DC

## 2012-01-08 NOTE — ED Notes (Signed)
Pt son reports patient having increased falls and confusion over the past several months. Pt also c/o constipation.

## 2012-01-08 NOTE — ED Notes (Signed)
Family easily agitated with patient asking the same question several times in a row.

## 2012-01-08 NOTE — ED Notes (Signed)
Pt's son reports increased confusion and frequent falls over past month.  Son states is unable to care for pt anymore and wants pt placed in SNF.  Pt alert and oriented x 3 at this time.  Reports no BM x 3 wks.  Son states pt has multiple BM's.  Son states pt is sleeping more frequently, approx 12-18 hrs per day.  Pt skin color is pale, MM dry, abd soft, non-distended.  Bruising to bil forearms in different stages of healing.  No bruising/abrasions to head/face.  Given warm blanket.  nad noted at this time.  NSR on monitor.
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
## 2012-01-08 NOTE — ED Notes (Signed)
Family states that they are having increasing difficulty in caring for the patient at home.

## 2012-01-08 NOTE — ED Notes (Signed)
Patient ambulatory in hallway with 2 person standby assist.  Patient's gait very unsteady and slow, leans forward at waist slightly when wwalking.  Family states that he has a Retail banker at home that he refuses to use.

## 2012-01-09 LAB — CBC
HCT: 26.1 % — ABNORMAL LOW (ref 39.0–52.0)
MCH: 29.5 pg (ref 26.0–34.0)
MCV: 91.6 fL (ref 78.0–100.0)
Platelets: 120 10*3/uL — ABNORMAL LOW (ref 150–400)
RDW: 14.9 % (ref 11.5–15.5)
WBC: 4 10*3/uL (ref 4.0–10.5)

## 2012-01-09 LAB — BASIC METABOLIC PANEL
CO2: 26 mEq/L (ref 19–32)
Calcium: 9.5 mg/dL (ref 8.4–10.5)
Chloride: 106 mEq/L (ref 96–112)
Creatinine, Ser: 1.63 mg/dL — ABNORMAL HIGH (ref 0.50–1.35)
Glucose, Bld: 99 mg/dL (ref 70–99)

## 2012-01-09 LAB — RETICULOCYTES: Retic Ct Pct: 0.6 % (ref 0.4–3.1)

## 2012-01-09 LAB — IRON AND TIBC
Iron: 37 ug/dL — ABNORMAL LOW (ref 42–135)
TIBC: 226 ug/dL (ref 215–435)
UIBC: 189 ug/dL (ref 125–400)

## 2012-01-09 LAB — FERRITIN: Ferritin: 284 ng/mL (ref 22–322)

## 2012-01-09 NOTE — Progress Notes (Signed)
I attempted to see pt for an eval, but he refuses.  He states that he was awake all night long and is just too tired.  "Maybe tomorrow".

## 2012-01-09 NOTE — Clinical Social Work Psychosocial (Signed)
Clinical Social Work Department BRIEF PSYCHOSOCIAL ASSESSMENT 01/09/2012  Patient:  [name], [name]     Account Number:  [id number]     Admit date:  01/08/2012  Clinical Social Worker:  Nancie Neas  Date/Time:  01/09/2012 04:30 PM  Referred by:  Physician  Date Referred:  01/09/2012 Referred for  SNF Placement   Other Referral:   Interview type:  Patient Other interview type:   son and son's fiance (Major and Tammy)    PSYCHOSOCIAL DATA Living Status:  ALONE Admitted from facility:   Level of care:   Primary support name:  Major Primary support relationship to patient:  CHILD, ADULT Degree of support available:   limited    CURRENT CONCERNS Current Concerns  Post-Acute Placement   Other Concerns:    SOCIAL WORK ASSESSMENT / PLAN CSW met with pt at bedside with CM.  Pt alert and answered questions appropriately. Pt is very HOH.  Pt states he lives alone and his son provides assistance with care. He plans to return home at d/c and said he uses a cane and has a walker. PT evaluated pt and recommend home health at d/c. Pt's son Major clarifies that pt rents one side of a duplex and son lives in the other.  He shared with CM and CSW that pt refuses to take care of himself at home- refusing to bathe and eat appropriately. Major said he has reached his limit and will not let pt come back home.  He also shared that their landlord has requested them to move out and son is refusing for pt to move in with him at his new home. CSW discussed possibility of ALF and he states pt does not have funds to pay privately. CSW asked about applying for Medicaid and son states they do not have time to do so and requested someone at hospital to complete. CSW informed son that financial counselor could assist, but documents would still need to be provided and he said he did not have time. CM and CSW had suggested possibility of pt returning home with full home health and a call to APS and he continues  to insist pt is not moving home with him. Above also discussed with Tammy, son's fiance, at son's request as she is involved in care as well. CSW spoke to [place] and CSW supervisor who request CSW to contact law enforcement about lack of due process as pt states he was not served regarding eviction.  Pt denies that his son's cousin is landlord and instead states son owns the property and he pays rent to him.  He was not aware of the fact that he could not go live with his son in the mobile home.  Pt states he will discuss with his son tonight and would like to call his daughter who lives in [place] to come get him and go live with her.  Pt does not want to give up his check to go to ALF. CSW will follow up in morning.   Assessment/plan status:  Psychosocial Support/Ongoing Assessment of Needs Other assessment/ plan:   Information/referral to community resources:   DSS    PATIENT'S/FAMILY'S RESPONSE TO PLAN OF CARE: Pt and family to discuss situation tonight. Pt is upset that he was not made aware of the fact that his son intended to move into a new place without him.        Derenda Fennel, [place] [phone]

## 2012-01-09 NOTE — Progress Notes (Signed)
UR Chart Review Completed  

## 2012-01-09 NOTE — Evaluation (Signed)
Physical Therapy Evaluation Patient Details Name: [name] MRN: [id number] DOB: [birth year] Today's Date: 01/09/2012 Time: 4098-1191 PT Time Calculation (min): 30 min  PT Assessment / Plan / Recommendation Clinical Impression  Pt finally agreed to PT eval.  He states that his R leg is "feeling much better today".  He does hve weakness in the RLE with pain upon movement.  I suspect that he has DJD.  This weakness puts him at fall risk and he should be using a walker at all times for gait.  I don't think that he does.  He would benefit from HHPT for strengthening of RLE.    PT Assessment  All further PT needs can be met in the next venue of care    Follow Up Recommendations  Home health PT    Barriers to Discharge        Equipment Recommendations  Rolling walker with 5" wheels (if he does not already have one)    Recommendations for Other Services     Frequency      Precautions / Restrictions Precautions Precautions: Fall Restrictions Weight Bearing Restrictions: No   Pertinent Vitals/Pain       Mobility  Bed Mobility Bed Mobility: Supine to Sit;Sit to Supine Supine to Sit: 6: Modified independent (Device/Increase time) Sit to Supine: 6: Modified independent (Device/Increase time) Details for Bed Mobility Assistance: mobility a bit labored Transfers Transfers: Sit to Stand;Stand to Sit Sit to Stand: 6: Modified independent (Device/Increase time) Stand to Sit: 6: Modified independent (Device/Increase time) Ambulation/Gait Ambulation/Gait Assistance: 5: Supervision Ambulation Distance (Feet): 200 Feet Assistive device: Rolling walker Gait Pattern: Step-through pattern;Left foot flat;Right foot flat Gait velocity: WNL Stairs: No Wheelchair Mobility Wheelchair Mobility: No    Exercises     PT Diagnosis: Difficulty walking;Generalized weakness  PT Problem List: Decreased strength;Decreased activity tolerance;Decreased mobility;Pain;Decreased safety awareness PT  Treatment Interventions:     PT Goals    Visit Information  Last PT Received On: 01/09/12 Reason Eval/Treat Not Completed: Patient refused    Subjective Data  Subjective: I'm still really tired...my R leg feels much better today Patient Stated Goal: return home   Prior Functioning  Home Living Lives With: Son Available Help at Discharge: Family Type of Home: House Home Access: Stairs to enter Secretary/administrator of Steps: 2 Entrance Stairs-Rails: None Home Layout: One level Firefighter: Standard Home Adaptive Equipment: Straight cane;Walker - rolling Additional Comments: I may have misunderstood pt, but I believe that he has a walker at home and uses this primarily Prior Function Level of Independence: Independent with assistive device(s) Able to Take Stairs?: Yes Driving: No Vocation: Retired Musician: Clinical cytogeneticist  Overall Cognitive Status: Appears within functional limits for tasks assessed/performed Arousal/Alertness: Awake/alert Orientation Level: Appears intact for tasks assessed Behavior During Session: [facility] for tasks performed    Extremity/Trunk Assessment Right Upper Extremity Assessment RUE ROM/Strength/Tone: Within functional levels RUE Sensation: WFL - Light Touch;WFL - Proprioception RUE Coordination: WFL - gross motor Left Upper Extremity Assessment LUE ROM/Strength/Tone: Within functional levels LUE Sensation: WFL - Light Touch;WFL - Proprioception LUE Coordination: WFL - gross motor Right Lower Extremity Assessment RLE ROM/Strength/Tone: Deficits RLE ROM/Strength/Tone Deficits: Hip strength=3/5, knee=3/5 RLE Sensation: WFL - Light Touch RLE Coordination: WFL - gross motor Left Lower Extremity Assessment LLE ROM/Strength/Tone: WFL for tasks assessed LLE Sensation: WFL - Light Touch LLE Coordination: WFL - gross motor Trunk Assessment Trunk Assessment: Kyphotic   Balance Balance Balance Assessed: Yes Dynamic  Standing Balance  Dynamic Standing - Balance Support: No upper extremity supported Dynamic Standing - Level of Assistance: 4: Min assist  End of Session PT - End of Session Equipment Utilized During Treatment: Gait belt Activity Tolerance: Patient tolerated treatment well Patient left: in bed;with call bell/phone within reach;with bed alarm set Nurse Communication: Mobility status  GP     Konrad Penta 01/09/2012, 2:28 PM

## 2012-01-09 NOTE — H&P (Signed)
NAMESMILEY, BIRR               ACCOUNT NO.:  [id number]  MEDICAL RECORD NO.:  [id number]  LOCATION:  A328                          FACILITY:  [facility]  PHYSICIAN:  Mila Homer. Sudie Bailey, M.D.DATE OF BIRTH:  1927-06-04  DATE OF ADMISSION:  01/08/2012 DATE OF DISCHARGE:  LH                             HISTORY & PHYSICAL   This 78 year old has had a lot of falling recently.  He has been confused.  His son noted that he had found feces smeared about his room and came to me about this yesterday.  He was last seen in the office in early July.  Medical problems include diabetes with neurological manifestations, hypertension, BPH, polyneuropathy and orthostatic hypotension.  At the time I saw him, he had a blood pressure of 80/60, and he was too weak to get up to be weighed on the scales.  At that time, I recommended discontinuing his amlodipine and decreasing the metoprolol to 100 mg, half a tablet twice a day.  He also has been on tamsulosin 0.4 mg daily, and I felt I might need to stop this and put him on Avodart instead due to orthostatic hypotension.  Other meds he has been on include 1. Oxycodone/APAP for severe pain. 2. Pantoprazole 40 mg daily. 3. Glipizide 5 mg at bedtime. 4. Amitiza 24 mcg which he uses just for constipation.  I reviewed the meds that were brought with him to the hospital.  In fact, he has been on metoprolol 100 mg half a tablet b.i.d. but has still been on the amlodipine and the tamsulosin.  OTHER DIAGNOSES: 1. Malignant neoplasm of prostate. 2. Osteoarthrosis of the knees. 3. Chronic constipation. 4. Insomnia. 5. Colonic diverticulosis. 6. Reflux esophagitis. 7. Pure hyperglyceridemia. 8. Tobacco use disorder.  He is sitting in the bed today.  He is unable to give me much of a history.  Much of the history came from his son.  PHYSICAL EXAMINATION:  VITAL SIGNS:  Temperature 98.3, pulse 55, respiratory rate 20, blood pressure 157/66. GENERAL:  He  is sitting up in bed eating breakfast.  He appears to be alert.  His speech appeared to be normal. HEART:  Today his heart has a regular rhythm and rate of 70. LUNGS:  Appeared to be clear throughout. ABDOMEN:  Soft without organomegaly or mass or tenderness. EXTREMITIES:  His feet are warm.  He has got good arches and he has at least 1+ posterior tibial pulses, but I cannot palpate dorsalis pedis pulses.  He has got onychomycosis bilaterally on the toenails. LYMPHATIC:  He has no axillary, supraclavicular, or anterior cervical adenopathy. HEENT:  Mucous membranes appeared to be somewhat dry.  LABORATORY DATA:  His admission white cell count 5000 with a normal differential.  Hemoglobin 9.3, platelet count 126,000.  Admission BUN was 27, creatinine 1.7, albumin 3.4.  Repeat of these values today showed that his hemoglobin after hydration dropped to 8.4.  His BUN had dropped to 23 with a creatinine 1.63.  Review of his recent blood tests in the office showed a BUN of 31, creatinine 1.8, an A1c of 5.4%, hemoglobin 9.2, and glucose 151.  In February his BUN was 24 with a  creatinine 1.67, A1c 5.3%, hemoglobin 10.1.  ADMISSION DIAGNOSES: 1. Generalized weakness of multifactorial etiology. 2. History of frequent falls, possibly related to orthostatic     hypotension. 3. Chronic renal insufficiency, stage III. 4. Benign essential hypertension. 5. Diabetes with neurological manifestations. 6. Polyneuropathy and diabetes. 7. Benign prostatic hyperplasia. 8. Insomnia. 9. History of malignant neoplasm of the prostate. 10.Chronic constipation. 11.Osteoarthrosis of the knees. 12.Pure hyperglyceridemia. 13.Tobacco use disorder. 14.Reflux esophagitis.  PLAN:  He is currently on IV fluids at 75 mL an hour but I have backed this down to 50 mL an hour.  He will get evaluation with physical therapy.  I am checking a PSA.  I reviewed his EKG, which showed a right bundle branch block, and a  CT of the brain which did show cortical volume loss.  I am concerned about his anemia but this is probably mostly secondary to renal problems.  We will check an anemia profile.  He may benefit from nursing home placement given all of the above.     Mila Homer. Sudie Bailey, M.D.     SDK/MEDQ  D:  01/09/2012  T:  01/09/2012  Job:  [id number]

## 2012-01-09 NOTE — Care Management Note (Signed)
    Page 1 of 2   01/11/2012     2:21:11 PM   CARE MANAGEMENT NOTE 01/11/2012  Patient:  [name], [name]   Account Number:  [id number]  Date Initiated:  01/09/2012  Documentation initiated by:  Sharrie Rothman  Subjective/Objective Assessment:   Pt admitted from home with dehydration. Pt lives with son and son is requesting placement due to inability to care for father at home.     Action/Plan:   CSW is aware of pts admission and will attempt to find pt a bed. IF pt discharges home CM will arrange [facility] RN and PT at discharge.   Anticipated DC Date:  01/10/2012   Anticipated DC Plan:  SKILLED NURSING FACILITY  In-house referral  Clinical Social Worker      DC Planning Services  CM consult      Choice offered to / List presented to:             Status of service:  Completed, signed off Medicare Important Message given?   (If response is "NO", the following Medicare IM given date fields will be blank) Date Medicare IM given:   Date Additional Medicare IM given:    Discharge Disposition:  ASSISTED LIVING  Per UR Regulation:    If discussed at Long Length of Stay Meetings, dates discussed:    Comments:  01/11/12 1420 Arlyss Queen, RN BSN CM Pt discharged to [facility]. [facility] RN and SW ordered by MD. Lillia Abed of [facility] is aware of pt and will collect the pts information from the chart. No DME needed. CSW will arrange discharge to facility. Pt and pts nurse aware of discharge arrangements.  01/09/12 1605 Arlyss Queen, RN BSN CM Spoke with pts son, Major Sibrian, about discharge plans. Pt wants to return home but pts son is refusing to take pt home. Pts son stated that they have been "kicked out" of their rental home and the son is in the process of moving. Pts son stated that he is not taking the pt home with him, that he is "fed up with my father and I don't care what you do with him. I am at a point of a nervous breakdown myself." CM and CSW encouraged son to start Medicaid  process for help with placement of father in ALF. Pt does not qualify for SNF due to walking 200 ft with PT. Pts son states that his father will go days and weeks without taking a bath, will urinate in the floor, and falls alot at home even when using his rollator. Explained  to son that pt will probably be ready on 01/10/12 for discharge and a discharge plan needs to be decided upon. Spoke with pts primary MD and ACT team consult was ordered to determine any mental illness and self neglect. CM also offered HH RN, PT, aide, and SW to son for pt at discharge. Pts son is still refusing to participate in pts discharge plans. CM spoke with AD of Care Management and notified of pts situation and discharge barriers.  01/09/12 1406 Arlyss Queen, RN BSN CM

## 2012-01-10 LAB — CBC
HCT: 27.1 % — ABNORMAL LOW (ref 39.0–52.0)
MCH: 29.7 pg (ref 26.0–34.0)
MCHC: 32.8 g/dL (ref 30.0–36.0)
MCV: 90.3 fL (ref 78.0–100.0)
Platelets: 110 10*3/uL — ABNORMAL LOW (ref 150–400)
RDW: 14.7 % (ref 11.5–15.5)

## 2012-01-10 LAB — BASIC METABOLIC PANEL
CO2: 24 mEq/L (ref 19–32)
Calcium: 9.1 mg/dL (ref 8.4–10.5)
Creatinine, Ser: 1.43 mg/dL — ABNORMAL HIGH (ref 0.50–1.35)
GFR calc non Af Amer: 44 mL/min — ABNORMAL LOW (ref >90)
Glucose, Bld: 50 mg/dL — ABNORMAL LOW (ref 70–99)
Sodium: 141 mEq/L (ref 135–145)

## 2012-01-10 MED ORDER — TUBERCULIN PPD 5 UNIT/0.1ML ID SOLN
5.0000 [IU] | Freq: Once | INTRADERMAL | Status: AC
  Administered 2012-01-11: 5 [IU] via INTRADERMAL
  Filled 2012-01-10: qty 0.1
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
## 2012-01-10 NOTE — Clinical Social Work Note (Signed)
Pt evaluated by [facility] today and has been accepted at facility. Pt is agreeable. Son brought pt clothes this afternoon but did not return CSW call. CSW spoke with pt's step daughter who reports she will discuss with her husband possibility of pt coming to live with her but it will not happen prior to d/c.  Possible d/c tomorrow.  CSW will follow up in morning. CSW requested RN to get order for TB skin test. Facility is willing to take pt with clear chest xray and TB test given and home health RN will read.   Derenda Fennel, [place] [phone]

## 2012-01-11 LAB — CBC
HCT: 24.7 % — ABNORMAL LOW (ref 39.0–52.0)
MCHC: 32.8 g/dL (ref 30.0–36.0)
Platelets: 117 10*3/uL — ABNORMAL LOW (ref 150–400)
RDW: 14.7 % (ref 11.5–15.5)
WBC: 3.4 10*3/uL — ABNORMAL LOW (ref 4.0–10.5)

## 2012-01-11 LAB — BASIC METABOLIC PANEL
BUN: 22 mg/dL (ref 6–23)
Calcium: 9.3 mg/dL (ref 8.4–10.5)
Creatinine, Ser: 1.47 mg/dL — ABNORMAL HIGH (ref 0.50–1.35)
GFR calc Af Amer: 49 mL/min — ABNORMAL LOW (ref >90)
GFR calc non Af Amer: 42 mL/min — ABNORMAL LOW (ref >90)

## 2012-01-11 NOTE — Progress Notes (Signed)
Pts Son arrived to pick up His Father. Son stated previously on the phone that He was giving His Father "one more chance" to eat, take baths, and cooperate at home. Son retrieved a wheelchair and took His Father down to private vehicle without notifying anyone that he was leaving. Dr. Sudie Bailey called prescriptions to Pts pharmacy and printed discharge instructions were placed in chart without signature. Pts Son was notified that Pt. Was to follow up in the office in 3 weeks.Son was stopped at entrance of hospital and declined getting His Father out of vehicle to sign papers.

## 2012-01-11 NOTE — Discharge Summary (Signed)
[name], [name]               ACCOUNT NO.:  [id number]  MEDICAL RECORD NO.:  [id number]  LOCATION:  A328                          FACILITY:  [facility]  PHYSICIAN:  Mila Homer. Sudie Bailey, M.D.DATE OF BIRTH:  1927/05/16  DATE OF ADMISSION:  01/08/2012 DATE OF DISCHARGE:  07/19/2013LH                              DISCHARGE SUMMARY   This 78 year old was admitted to the hospital for frequent falls and dehydration.  He had a benign 3 day hospitalization extending from January 09, 2012 to January 11, 2012.  His vital signs remained stable. His admission blood test shows white count of 5000 and CMP which showed a BUN of 27, creatinine 1.70 and a glucose of 113.  After several days of rehydration, his BUN had dropped to 22 and creatinine 1.47.  Hemoglobin dropped to 8.1 on that same regimen. He had a vitamin B12 level of 258 and an iron level of 37, and iron saturation of 16%, but a normal ferritin of 284. His manual reticulocyte count was 17.1.  Several of his glucoses were in the 50 range.  His urinalysis was negative.  His admission chest x-ray showed just a tiny left pleural effusion and old healing fractures of right 6th, 7th and 8th ribs.  The CT of the head without contrast showed some mild cortical volume loss, but no acute intracranial findings.  On 12-lead EKG, he had a heart rate of 72.  There was a first-degree AV block, but no significant change compared to the EKG of June 30, 2011. He was admitted to the hospital.  Part of the reason, he was admitted was that in the emergency room, he could not walk on his own and required 2 people, 1 on each side to assist him in ambulation.  He was continued on amlodipine, glipizide, metoprolol, pantoprazole, tamsulosin and IV fluids, which were initially at 75 mL an hour but then were dropped to 50 mL an hour and finally 10-20 mL an hour. He did well on this regimen.  He appeared to be improved his 2nd day and by his 3rd day, a good deal  improved.  Energy level better.  We arranged for transfer to a rest home, arranged by social service.  FINAL DISCHARGE DIAGNOSES: 1. Dehydration. 2. Chronic constipation. 3. Anemia. 4. Chronic renal insufficiency. 5. Osteoarthrosis of the knees. 6. Insomnia. 7. Colonic diverticulosis. 8. Reflux esophagitis. 9. Pure hyperlipidemia. 10.Decreased vitamin B12 level. 11.Tobacco use disorder.  PLAN:  He is being discharged to a nursing facility as mentioned above. Discharge planning is to arrange this.  I have sent in to [facility] in [place], [place] prescriptions for: 1. Amlodipine 10 mg daily. 2. Glipizide 5 mg daily, 3. Pantoprazole 40 mg daily. 4. Tamsulosin 0.4 mg daily (30 pills, 11 refills). 5. Metoprolol 100 mg b.i.d. (60, pills, 11 refills). 6. Amitiza 25 mcg daily, p.r.n. constipation (30 pills, 11 refills). 7. Calcium 600 with vitamin D 200 daily. 8. He will also need be on vitamin B12 1000 mcg 2 daily  due to his     very low B12 level.  Addendum: His son decided to take him back to live close by.  Mila Homer. Sudie Bailey, M.D.     SDK/MEDQ  D:  01/11/2012  T:  01/11/2012  Job:  [id number]

## 2012-01-11 NOTE — Progress Notes (Signed)
Inpatient Diabetes Program Recommendations  AACE/ADA: New Consensus Statement on Inpatient Glycemic Control  Target Ranges:  Prepandial:   less than 140 mg/dL      Peak postprandial:   less than 180 mg/dL (1-2 hours)      Critically ill patients:  140 - 180 mg/dL  Pager:  [phone] Hours:  8 am-10pm   Reason for Visit: Hypoglycemia x 2 days  Inpatient Diabetes Program Recommendations Correction (SSI): Please order CBGs Oral Agents: D/C Glipizide due to hypoglycemia x 2 days and family doesn't think patient taking this at home either.    Alfredia Client PhD, RN Diabetes Coordinator  Office:  [phone] Team Pager:  [phone]
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
## 2012-02-16 ENCOUNTER — Encounter (HOSPITAL_COMMUNITY): Payer: Self-pay | Admitting: Emergency Medicine

## 2012-02-16 ENCOUNTER — Emergency Department (HOSPITAL_COMMUNITY): Payer: Medicare PPO

## 2012-02-16 ENCOUNTER — Inpatient Hospital Stay (HOSPITAL_COMMUNITY)
Admission: EM | Admit: 2012-02-16 | Discharge: 2012-02-20 | DRG: 638 | Disposition: A | Discharge status: Home Health | Payer: Medicare PPO | Attending: Family Medicine | Admitting: Family Medicine

## 2012-02-16 DIAGNOSIS — K2270 Barrett's esophagus without dysplasia: Secondary | ICD-10-CM | POA: Diagnosis present

## 2012-02-16 DIAGNOSIS — S60219A Contusion of unspecified wrist, initial encounter: Secondary | ICD-10-CM | POA: Diagnosis present

## 2012-02-16 DIAGNOSIS — N289 Disorder of kidney and ureter, unspecified: Secondary | ICD-10-CM | POA: Diagnosis present

## 2012-02-16 DIAGNOSIS — N400 Enlarged prostate without lower urinary tract symptoms: Secondary | ICD-10-CM | POA: Diagnosis present

## 2012-02-16 DIAGNOSIS — M1990 Unspecified osteoarthritis, unspecified site: Secondary | ICD-10-CM | POA: Diagnosis present

## 2012-02-16 DIAGNOSIS — D61818 Other pancytopenia: Secondary | ICD-10-CM | POA: Diagnosis not present

## 2012-02-16 DIAGNOSIS — Z9181 History of falling: Secondary | ICD-10-CM

## 2012-02-16 DIAGNOSIS — W19XXXA Unspecified fall, initial encounter: Secondary | ICD-10-CM | POA: Diagnosis present

## 2012-02-16 DIAGNOSIS — Z87891 Personal history of nicotine dependence: Secondary | ICD-10-CM

## 2012-02-16 DIAGNOSIS — S0003XA Contusion of scalp, initial encounter: Secondary | ICD-10-CM | POA: Diagnosis present

## 2012-02-16 DIAGNOSIS — E162 Hypoglycemia, unspecified: Secondary | ICD-10-CM

## 2012-02-16 DIAGNOSIS — K5900 Constipation, unspecified: Secondary | ICD-10-CM | POA: Diagnosis present

## 2012-02-16 DIAGNOSIS — I10 Essential (primary) hypertension: Secondary | ICD-10-CM | POA: Diagnosis present

## 2012-02-16 DIAGNOSIS — F0390 Unspecified dementia without behavioral disturbance: Secondary | ICD-10-CM | POA: Diagnosis present

## 2012-02-16 DIAGNOSIS — E1169 Type 2 diabetes mellitus with other specified complication: Principal | ICD-10-CM | POA: Diagnosis not present

## 2012-02-16 DIAGNOSIS — Z8673 Personal history of transient ischemic attack (TIA), and cerebral infarction without residual deficits: Secondary | ICD-10-CM

## 2012-02-16 DIAGNOSIS — Z79899 Other long term (current) drug therapy: Secondary | ICD-10-CM

## 2012-02-16 LAB — CBC WITH DIFFERENTIAL/PLATELET
Eosinophils Relative: 1 % (ref 0–5)
HCT: 30.2 % — ABNORMAL LOW (ref 39.0–52.0)
Lymphocytes Relative: 11 % — ABNORMAL LOW (ref 12–46)
Lymphs Abs: 0.5 10*3/uL — ABNORMAL LOW (ref 0.7–4.0)
MCV: 92.1 fL (ref 78.0–100.0)
Monocytes Absolute: 0.3 10*3/uL (ref 0.1–1.0)
Monocytes Relative: 7 % (ref 3–12)
RBC: 3.28 MIL/uL — ABNORMAL LOW (ref 4.22–5.81)
RDW: 14.1 % (ref 11.5–15.5)
WBC: 4.4 10*3/uL (ref 4.0–10.5)

## 2012-02-16 LAB — URINALYSIS, ROUTINE W REFLEX MICROSCOPIC
Ketones, ur: NEGATIVE mg/dL
Leukocytes, UA: NEGATIVE
Nitrite: NEGATIVE
Specific Gravity, Urine: 1.015 (ref 1.005–1.030)
Urobilinogen, UA: 0.2 mg/dL (ref 0.0–1.0)
pH: 5 (ref 5.0–8.0)

## 2012-02-16 LAB — COMPREHENSIVE METABOLIC PANEL
BUN: 20 mg/dL (ref 6–23)
CO2: 19 mEq/L (ref 19–32)
Calcium: 10.1 mg/dL (ref 8.4–10.5)
Creatinine, Ser: 1.62 mg/dL — ABNORMAL HIGH (ref 0.50–1.35)
GFR calc Af Amer: 44 mL/min — ABNORMAL LOW (ref >90)
GFR calc non Af Amer: 38 mL/min — ABNORMAL LOW (ref >90)
Glucose, Bld: 118 mg/dL — ABNORMAL HIGH (ref 70–99)

## 2012-02-16 LAB — GLUCOSE, CAPILLARY

## 2012-02-16 LAB — CBC
HCT: 28.2 % — ABNORMAL LOW (ref 39.0–52.0)
Hemoglobin: 9.1 g/dL — ABNORMAL LOW (ref 13.0–17.0)
MCH: 29.5 pg (ref 26.0–34.0)
MCV: 91.6 fL (ref 78.0–100.0)
RBC: 3.08 MIL/uL — ABNORMAL LOW (ref 4.22–5.81)

## 2012-02-16 LAB — RAPID URINE DRUG SCREEN, HOSP PERFORMED
Barbiturates: NOT DETECTED
Tetrahydrocannabinol: NOT DETECTED

## 2012-02-16 LAB — AMMONIA: Ammonia: 29 umol/L (ref 11–60)

## 2012-02-16 LAB — RETICULOCYTES: Retic Ct Pct: 0.6 % (ref 0.4–3.1)

## 2012-02-16 LAB — PROTIME-INR
INR: 1.01 (ref 0.00–1.49)
Prothrombin Time: 13.5 seconds (ref 11.6–15.2)

## 2012-02-16 LAB — HEPATIC FUNCTION PANEL
ALT: 7 U/L (ref 0–53)
AST: 14 U/L (ref 0–37)
Bilirubin, Direct: <0.1 mg/dL (ref 0.0–0.3)

## 2012-02-16 MED ORDER — TAMSULOSIN HCL 0.4 MG PO CAPS
0.4000 mg | ORAL_CAPSULE | Freq: Every day | ORAL | Status: DC
  Administered 2012-02-17 – 2012-02-20 (×4): 0.4 mg via ORAL
  Filled 2012-02-16 (×7): qty 1

## 2012-02-16 MED ORDER — ASPIRIN 81 MG PO CHEW
81.0000 mg | CHEWABLE_TABLET | Freq: Every day | ORAL | Status: DC
  Administered 2012-02-17 – 2012-02-20 (×4): 81 mg via ORAL
  Filled 2012-02-16 (×4): qty 1

## 2012-02-16 MED ORDER — METOPROLOL TARTRATE 25 MG PO TABS
25.0000 mg | ORAL_TABLET | Freq: Two times a day (BID) | ORAL | Status: DC
  Administered 2012-02-16 – 2012-02-19 (×6): 25 mg via ORAL
  Filled 2012-02-16 (×6): qty 1

## 2012-02-16 MED ORDER — IOHEXOL 300 MG/ML  SOLN
80.0000 mL | Freq: Once | INTRAMUSCULAR | Status: AC | PRN
  Administered 2012-02-16: 80 mL via INTRAVENOUS

## 2012-02-16 MED ORDER — ENOXAPARIN SODIUM 40 MG/0.4ML ~~LOC~~ SOLN
40.0000 mg | SUBCUTANEOUS | Status: DC
  Administered 2012-02-16 – 2012-02-19 (×4): 40 mg via SUBCUTANEOUS
  Filled 2012-02-16 (×4): qty 0.4

## 2012-02-16 MED ORDER — SODIUM CHLORIDE 0.9 % IV BOLUS (SEPSIS)
500.0000 mL | Freq: Once | INTRAVENOUS | Status: AC
  Administered 2012-02-16: 500 mL via INTRAVENOUS

## 2012-02-16 MED ORDER — TRAMADOL HCL 50 MG PO TABS
50.0000 mg | ORAL_TABLET | Freq: Four times a day (QID) | ORAL | Status: DC | PRN
  Administered 2012-02-16 – 2012-02-19 (×2): 50 mg via ORAL
  Filled 2012-02-16 (×2): qty 1

## 2012-02-16 MED ORDER — AMLODIPINE BESYLATE 5 MG PO TABS
5.0000 mg | ORAL_TABLET | Freq: Every day | ORAL | Status: DC
  Administered 2012-02-17 – 2012-02-19 (×3): 5 mg via ORAL
  Filled 2012-02-16 (×3): qty 1

## 2012-02-16 MED ORDER — ONDANSETRON HCL 4 MG/2ML IJ SOLN
4.0000 mg | Freq: Once | INTRAMUSCULAR | Status: AC
  Administered 2012-02-16: 4 mg via INTRAVENOUS
  Filled 2012-02-16: qty 2

## 2012-02-16 MED ORDER — CLONIDINE HCL 0.1 MG PO TABS
0.1000 mg | ORAL_TABLET | Freq: Three times a day (TID) | ORAL | Status: DC
  Administered 2012-02-16 – 2012-02-19 (×9): 0.1 mg via ORAL
  Filled 2012-02-16 (×10): qty 1

## 2012-02-16 NOTE — H&P (Signed)
[id number] 
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
## 2012-02-16 NOTE — ED Notes (Signed)
Patient resting comfortably in bed.  Requesting something to eat.  Family remains at bedside.

## 2012-02-16 NOTE — ED Notes (Signed)
Patient presents to ER via EMS; family states patient fell last night.  Has dried blood to left nostril and face.  Patient was hypoglycemic on EMS arrival; CBG was 36; gave Glucagon and now CBG is 63.

## 2012-02-16 NOTE — ED Notes (Signed)
Patient returned from CT scan actively vomiting.  Gastrocult completed with positive result.  Dr. Rosalia Hammers notified.

## 2012-02-16 NOTE — ED Notes (Signed)
Patient given crackers and a tv dinner for continued low blood sugar.

## 2012-02-17 ENCOUNTER — Inpatient Hospital Stay (HOSPITAL_COMMUNITY): Payer: Medicare PPO

## 2012-02-17 LAB — GLUCOSE, CAPILLARY
Glucose-Capillary: 102 mg/dL — ABNORMAL HIGH (ref 70–99)
Glucose-Capillary: 91 mg/dL (ref 70–99)

## 2012-02-17 LAB — IRON AND TIBC
Saturation Ratios: 11 % — ABNORMAL LOW (ref 20–55)
TIBC: 237 ug/dL (ref 215–435)

## 2012-02-17 LAB — HEPATIC FUNCTION PANEL
ALT: 5 U/L (ref 0–53)
AST: 11 U/L (ref 0–37)
Albumin: 2.7 g/dL — ABNORMAL LOW (ref 3.5–5.2)
Alkaline Phosphatase: 63 U/L (ref 39–117)
Bilirubin, Direct: 0.1 mg/dL (ref 0.0–0.3)
Total Bilirubin: 0.2 mg/dL — ABNORMAL LOW (ref 0.3–1.2)

## 2012-02-17 MED ORDER — MAGNESIUM HYDROXIDE 400 MG/5ML PO SUSP
60.0000 mL | Freq: Two times a day (BID) | ORAL | Status: DC | PRN
  Administered 2012-02-17 – 2012-02-20 (×4): 60 mL via ORAL
  Filled 2012-02-17 (×5): qty 60

## 2012-02-17 MED ORDER — FLEET ENEMA 7-19 GM/118ML RE ENEM
1.0000 | ENEMA | Freq: Once | RECTAL | Status: AC
  Administered 2012-02-18: 1 via RECTAL

## 2012-02-17 MED ORDER — MAGNESIUM HYDROXIDE 400 MG/5ML PO SUSP
60.0000 mL | ORAL | Status: AC
  Administered 2012-02-17: 60 mL via ORAL

## 2012-02-17 NOTE — Progress Notes (Signed)
LM for Pt's son.  CSW to continue to follow.  Providence Crosby, LCSWA Clinical Social Work [phone]

## 2012-02-17 NOTE — Progress Notes (Signed)
Clinical Social Work Department BRIEF PSYCHOSOCIAL ASSESSMENT 02/17/2012  Patient:  [name], [name]     Account Number:  [id number]     Admit date:  02/16/2012  Clinical Social Worker:  Doroteo Glassman  Date/Time:  02/17/2012 11:35 AM  Referred by:  Physician  Date Referred:  02/17/2012 Referred for  SNF Placement   Other Referral:   Interview type:  Patient Other interview type:    PSYCHOSOCIAL DATA Living Status:  WITH ADULT CHILDREN Admitted from facility:   Level of care:   Primary support name:  Major Primary support relationship to patient:  CHILD, ADULT Degree of support available:   unknown    CURRENT CONCERNS Current Concerns  Post-Acute Placement   Other Concerns:    SOCIAL WORK ASSESSMENT / PLAN CSW met with Pt to discuss d/c plans.  Pt HOH.    Pt was alert and oriented and discussed with CSW his current medical pxs.  CSW offered comfort and support.    Pt reports that he lives with his son and that he intends to return to his son's home upon d/c.    CSW to discuss d/c plans with son.    CSW thanked Pt for his time.   Assessment/plan status:  Psychosocial Support/Ongoing Assessment of Needs Other assessment/ plan:   Information/referral to community resources:    PATIENT'S/FAMILY'S RESPONSE TO PLAN OF CARE: Pt thanked CSW for time and assistance.   Providence Crosby, LCSWA Clinical Social Work [phone]

## 2012-02-17 NOTE — Progress Notes (Signed)
ANTICOAGULATION CONSULT NOTE - Initial Consult  Pharmacy Consult for Lovenox Indication: VTE prophylaxis  No Known Allergies  Patient Measurements: Height: 5\' 8"  (172.7 cm) Weight: 153 lb 3.5 oz (69.5 kg) IBW/kg (Calculated) : 68.4   Vital Signs: Temp: 98.6 F (37 C) (08/25 0439) Temp src: Oral (08/25 0439) BP: 104/60 mmHg (08/25 0439) Pulse Rate: 62  (08/25 0439)  Labs:  Basename 02/16/12 1804 02/16/12 1346  HGB 9.1* 9.8*  HCT 28.2* 30.2*  PLT 133* 137*  APTT -- --  LABPROT -- 13.5  INR -- 1.01  HEPARINUNFRC -- --  CREATININE -- 1.62*  CKTOTAL -- 131  CKMB -- --  TROPONINI -- <0.30    Estimated Creatinine Clearance: 33.4 ml/min (by C-G formula based on Cr of 1.62).   Medical History: Past Medical History  Diagnosis Date  . Diabetes mellitus   . Stroke   . Shortness of breath   . Arthritis   . Chewing tobacco nicotine dependence without complication   . Dementia 04/19/2011  . Colon polyps 05/14/2005    Dr Ovidio Kin path avail at this time  . Barrett's esophagus 05/14/2005    EGD Dr Jarold Motto, gastritis  . S/P colonoscopy 05/14/2005    colonic avm cauterized  . Anemia     Medications:  Scheduled:    . amLODipine  5 mg Oral Daily  . aspirin  81 mg Oral Daily  . cloNIDine  0.1 mg Oral TID  . enoxaparin (LOVENOX) injection  40 mg Subcutaneous Q24H  . metoprolol tartrate  25 mg Oral BID  . ondansetron  4 mg Intravenous Once  . sodium chloride  500 mL Intravenous Once  . Tamsulosin HCl  0.4 mg Oral Daily    Assessment: 78 yo M admitted with hypoglycemia to receive Lovenox for VTE px while hospitalized.  Baseline anemia and renal insufficiency noted.   Goal of Therapy:  Monitor platelets by anticoagulation protocol: Yes   Plan:  1) Lovenox 40mg  sq daily 2) Monitor CBC and renal function  Elson Clan 02/17/2012,9:45 AM

## 2012-02-17 NOTE — Progress Notes (Signed)
Subjective: Complains of constipation. His hypoglycemia has improved. No nausea or vomiting.  Objective: Vital signs in last 24 hours: Temp:  [98.4 F (36.9 C)-98.6 F (37 C)] 98.6 F (37 C) (08/25 0439) Pulse Rate:  [60-79] 60  (08/25 1024) Resp:  [18] 18  (08/25 0439) BP: (104-171)/(60-74) 122/66 mmHg (08/25 1612) SpO2:  [98 %-100 %] 98 % (08/25 0813) Weight change:  Last BM Date:  (pt stated last bm 2 weeks ago)  Intake/Output from previous day: 08/24 0701 - 08/25 0700 In: 480 [P.O.:480] Out: 200 [Urine:200]  PHYSICAL EXAM General appearance: alert and no distress Resp: clear to auscultation bilaterally Cardio: S1, S2 normal GI: soft, non-tender; bowel sounds normal; no masses,  no organomegaly Extremities: extremities normal, atraumatic, no cyanosis or edema  Lab Results:    @labtest @ [facility] No results found for this basename: PHART,PCO2,PO2ART,TCO2,HCO3 in the last 72 hours CULTURES Recent Results (from the past 240 hour(s))  CULTURE, BLOOD (ROUTINE X 2)     Status: Normal (Preliminary result)   Collection Time   02/16/12  6:05 PM      Component Value Range Status Comment   Specimen Description BLOOD LEFT HAND   Final    Special Requests BOTTLES DRAWN AEROBIC AND ANAEROBIC 6CC   Final    Culture PENDING   Incomplete    Report Status PENDING   Incomplete   CULTURE, BLOOD (ROUTINE X 2)     Status: Normal (Preliminary result)   Collection Time   02/16/12  6:14 PM      Component Value Range Status Comment   Specimen Description RIGHT ANTECUBITAL   Final    Special Requests BOTTLES DRAWN AEROBIC AND ANAEROBIC [facility]   Final    Culture PENDING   Incomplete    Report Status PENDING   Incomplete    Studies/Results: Dg Chest 1 View  02/16/2012  *RADIOLOGY REPORT*  Clinical Data: Altered level of consciousness.  CHEST - 1 VIEW  Comparison: 01/08/2012  Findings: There are old right rib fractures and chronic deformity of the proximal right humerus.  The lungs are clear  without airspace disease and no evidence for pneumothorax.  Heart and mediastinum are within normal limits.  The trachea is midline.  IMPRESSION: No acute chest findings.  Old fractures.   Original Report Authenticated By: Richarda Overlie, M.D.    Dg Wrist Complete Right  02/16/2012  *RADIOLOGY REPORT*  Clinical Data: Altered level of consciousness and fall.  RIGHT WRIST - COMPLETE 3+ VIEW  Comparison: 04/19/2011  Findings: Three views of the right wrist were obtained.  Cortical thickening of the distal radius and ulna are consistent with old fractures and stable.  No evidence for acute fracture or dislocation.  IMPRESSION: No acute bony abnormality.  Probable old fractures involving the distal radius and ulna.   Original Report Authenticated By: Richarda Overlie, M.D.    Dg Abd 1 View  02/17/2012  *RADIOLOGY REPORT*  Clinical Data: Constipation.  ABDOMEN - 1 VIEW  Comparison: None.  Findings: The bowel gas pattern is normal.  Stool burden is unremarkable.  Surgical clips in the pelvis noted.  There is some degenerative disease about the hips.  IMPRESSION: Negative exam.   Original Report Authenticated By: Bernadene Bell. Maricela Curet, M.D.    Ct Head Wo Contrast  02/16/2012  *RADIOLOGY REPORT*  Clinical Data: Larey Seat, nosebleed. Headache.  CT HEAD WITHOUT CONTRAST  Technique:  Contiguous axial images were obtained from the base of the skull through the vertex without contrast.  Comparison:  01/08/2012  Findings: There is no evidence for acute infarction, intracranial hemorrhage, mass lesion, hydrocephalus, or extra-axial fluid. Moderate to severe atrophy is present.  Advanced chronic microvascular ischemic change is present in the periventricular and subcortical white matter.  The calvarium is intact.  There is advanced vascular calcification. Mild bilateral ethmoid sinus disease.  There is an air-fluid level in the right maxillary sinus which is new from previous study. There appears to be a small radiopaque foreign body in the  soft tissues over the right zygoma (image 4, series 3).  Within limits of visualization of the upper face, no fracture is seen.  IMPRESSION: Stable atrophy and chronic microvascular ischemic change.  No skull fracture or intracranial hemorrhage.  Right maxillary sinus air-fluid level.  Suspect tiny radiopaque foreign body superficial to the right zygoma.  See comments above.   Original Report Authenticated By: Elsie Stain, M.D.    Ct Abdomen Pelvis W Contrast  02/16/2012  *RADIOLOGY REPORT*  Clinical Data: Fall, abdominal pain  CT ABDOMEN AND PELVIS WITH CONTRAST  Technique:  Multidetector CT imaging of the abdomen and pelvis was performed following the standard protocol during bolus administration of intravenous contrast.  Contrast: 80mL OMNIPAQUE IOHEXOL 300 MG/ML  SOLN  Comparison: None.  Findings: Small bilateral pleural effusions, left greater than right.  Liver, spleen, pancreas, and adrenal glands are within normal limits.  Gallbladder is notable for cholelithiasis, without associated inflammatory changes.  Left renal atrophy/cortical scarring.  Right kidney is unremarkable.  No hydronephrosis.  No evidence of bowel obstruction.  Atherosclerotic calcifications of the abdominal aorta and branch vessels.  Vague nonspecific mesenteric stranding (series 2/image 32), without discrete adenopathy or fluid.  No hemoperitoneum.  No free air.  Status post prostatectomy with pelvic lymph node dissection.  Bladder is within normal limits.  Mild sclerosis involving the left ninth rib (series 2/image 23), nonspecific.  Old fracture deformity of the left 12th rib (series 2/image 27).  Healing fractures of the right lateral 5th-7th ribs. Mild to moderate compression deformity involving the L3 vertebral body (sagittal image 60), likely chronic.  IMPRESSION: No evidence of acute traumatic injury to the abdomen/pelvis.  Healing right lateral 5th-7th rib fractures. Old left 12th rib fracture.  Mild to moderate compression  deformity of the L3 vertebral body, likely chronic.   Original Report Authenticated By: Charline Bills, M.D.     Medications: I have reviewed the patient's current medications.  Assesment: 1. Change in mental status due to multifactorial causes. 2. Recurrent hypoglycemia 3.Dementia 4. Constipation 5. DM type II   Active Problems:  * No active hospital problems. *     Plan: 1. Continue sliding scale 2. Continue to monitor mental status 3. Endocrine consult 4.physical therapy and social work consult S-S enema    LOS: 1 day   Sahiti Joswick 02/17/2012, 7:40 PM

## 2012-02-18 DIAGNOSIS — D61818 Other pancytopenia: Secondary | ICD-10-CM

## 2012-02-18 LAB — HEPATIC FUNCTION PANEL
ALT: <5 U/L (ref 0–53)
Bilirubin, Direct: <0.1 mg/dL (ref 0.0–0.3)

## 2012-02-18 LAB — GLUCOSE, CAPILLARY
Glucose-Capillary: 109 mg/dL — ABNORMAL HIGH (ref 70–99)
Glucose-Capillary: 123 mg/dL — ABNORMAL HIGH (ref 70–99)

## 2012-02-18 LAB — CBC
HCT: 25.2 % — ABNORMAL LOW (ref 39.0–52.0)
MCHC: 31 g/dL (ref 30.0–36.0)
Platelets: 118 10*3/uL — ABNORMAL LOW (ref 150–400)
RDW: 14.1 % (ref 11.5–15.5)

## 2012-02-18 LAB — HEMOGLOBIN A1C: Mean Plasma Glucose: 105 mg/dL (ref <117)

## 2012-02-18 LAB — FERRITIN: Ferritin: 228 ng/mL (ref 22–322)

## 2012-02-18 LAB — BASIC METABOLIC PANEL
BUN: 26 mg/dL — ABNORMAL HIGH (ref 6–23)
GFR calc Af Amer: 39 mL/min — ABNORMAL LOW (ref >90)
GFR calc non Af Amer: 33 mL/min — ABNORMAL LOW (ref >90)
Potassium: 5.2 mEq/L — ABNORMAL HIGH (ref 3.5–5.1)

## 2012-02-18 LAB — TSH: TSH: 0.266 u[IU]/mL — ABNORMAL LOW (ref 0.350–4.500)

## 2012-02-18 MED ORDER — CYANOCOBALAMIN 1000 MCG/ML IJ SOLN: 1000.0000 ug | Freq: Once | INTRAMUSCULAR | Status: DC

## 2012-02-18 MED ORDER — SODIUM CHLORIDE 0.9 % IJ SOLN
INTRAMUSCULAR | Status: AC
  Administered 2012-02-18: 10 mL
  Filled 2012-02-18: qty 3

## 2012-02-18 MED ORDER — POLYETHYLENE GLYCOL 3350 17 G PO PACK
17.0000 g | PACK | Freq: Every day | ORAL | Status: DC
  Administered 2012-02-18 – 2012-02-20 (×3): 17 g via ORAL
  Filled 2012-02-18 (×3): qty 1

## 2012-02-18 MED ORDER — INSULIN ASPART 100 UNIT/ML ~~LOC~~ SOLN: 0.0000 [IU] | Freq: Three times a day (TID) | SUBCUTANEOUS | Status: DC

## 2012-02-18 NOTE — Care Management Note (Signed)
    Page 1 of 2   02/20/2012     11:09:10 AM   CARE MANAGEMENT NOTE 02/20/2012  Patient:  [name], [name]   Account Number:  [id number]  Date Initiated:  02/18/2012  Documentation initiated by:  Sharrie Rothman  Subjective/Objective Assessment:   Pt admitted from home with hypoglycemia and a fall. Pt lives next door to his son and the son helps take care of pt. Pts son wants pt to be placed. Pt wants to return home. Pt has used [facility] in the past. Pt has a walker for home use.     Action/Plan:   CSW is actively searching for beds in case pt and PT agrees. Will set up [facility] again if pt dose discharge home.   Anticipated DC Date:  02/20/2012   Anticipated DC Plan:  SKILLED NURSING FACILITY  In-house referral  Clinical Social Worker      DC Associate Professor  CM consult      [facility] Choice  HOME HEALTH   Choice offered to / List presented to:  C-2 HC POA / Guardian        HH arranged  HH-1 RN      [facility] agency  [facility]   Status of service:  Completed, signed off Medicare Important Message given?   (If response is "NO", the following Medicare IM given date fields will be blank) Date Medicare IM given:   Date Additional Medicare IM given:    Discharge Disposition:  HOME W HOME HEALTH SERVICES  Per UR Regulation:    If discussed at Long Length of Stay Meetings, dates discussed:    Comments:  02/20/12 1105 Luis Queen, RN BSN CM Pt discharged home today with [facility]. [name] of [facility] is aware and will collect the pts information from the chart. HH services will start within the next 48 hours. Pt, pts son, and pts nurse aware of pts discharge arrangements. No other CM/HH needs noted.  02/18/12 1512 Luis Queen, RN BSN CM

## 2012-02-18 NOTE — Consult Note (Signed)
[name], RIEGLER               ACCOUNT NO.:  [id number]  MEDICAL RECORD NO.:  [id number]  LOCATION:  A327                          FACILITY:  [facility]  PHYSICIAN:  Purcell Nails, MD DATE OF BIRTH:  May 29, 1927  DATE OF CONSULTATION:  02/18/2012 DATE OF DISCHARGE:                                CONSULTATION   REASON FOR CONSULTATION:  Hypoglycemia.  HISTORY OF PRESENT ILLNESS:  This is an 78 year old gentleman with multiple medical problems, including type 2 diabetes for approximately 45 years.  He is currently in house with a complaint of hypoglycemia at 31, when the patient was found somewhat unresponsive at home by his son. In the ER, he was found to have a blood glucose of 50.  He was given dextrose 50.  With continued stabilization of the blood glucose because of his poor social and cognitive dysfunction, he was hospitalized.  Part of his history includes a need for skilled nursing facility.  He is known to have diabetes for approximately 45 years.  Also dementia, hypertension.  He was taking glipizide 5 mg p.o. daily for his diabetes and he did not require any insulin in the past per the patient.  He admits that he has not been eating very well prior to hospitalization. He denies coronary artery disease or stroke.  PAST MEDICAL HISTORY:  Significant for type 2 diabetes approximately 45 years.  Significant for BPH, hypertension, history of stroke, degenerative joint disease, dementia, Barrett's esophagus, and anemia.  PAST SURGICAL HISTORY:  Plate insertion in head as a young person, colonoscopy, EGD, and prostate surgery.  SOCIAL HISTORY:  Lives alone at home.  He is not an active smoker.  He is not using any drugs or alcohol.  CURRENT HOME MEDICATIONS: 1. Flomax 0.4 mg p.o. daily. 2. Protonix 40 mg daily. 3. Metoprolol 50 mg p.o. b.i.d. 4. Glipizide 5 mg p.o. daily. 5. Calcium with vitamin D. 6. Norvasc 5 mg daily.  REVIEW OF SYSTEMS:  As in HPI, the rest have  been reviewed and negative including no bleeding, no chest pain, no shortness of breath, no diarrhea, and no vomiting.  PHYSICAL EXAMINATION:  GENERAL:  He is sleepy but arousable in his hospital bed. VITAL SIGNS:  Blood pressure 113/65, pulse rate 62, temperature 97.5. HEENT:  Well hydrated. NECK:  Negative for JVD or thyromegaly. CHEST:  Clear to auscultation bilaterally. CARDIOVASCULAR:  Distant heart sounds.  No murmur.  No gallop. ABDOMEN:  Soft and nontender. EXTREMITIES:  He has trace 1+ pedal edema. NEUROLOGIC:  Grossly intact with no focal deficits.  ASSESSMENT: 1. Type 2 diabetes, chronic at least for 45 years, controlled unknown     currently. 2. Recent hypoglycemia currently corrected with withholding of his     glipizide. 3. Dementia. 4. Hypertension. 5. Degenerative joint disease.  PLAN:  I agree with withholding of his sulfonylurea medication at this point.  Continue to monitor blood glucose before meals and at bedtime, and put only on low dose NovoLog sliding scale at mealtimes only. Obtain hemoglobin A1c.  Depending on control of diabetes, he may be evaluated for other safer medication other than the glipizide.  He is reasonable on oral intake, continue to  feed by mouth as tolerated.  I will plan to see this patient as an outpatient as well as following him in house until discharged.  Dear Dr. Sudie Bailey, thank you for the opportunity to participate in the care of this pleasant patient.  I will update you in his progress.          ______________________________ Purcell Nails, MD     GN/MEDQ  D:  02/18/2012  T:  02/18/2012  Job:  [id number]

## 2012-02-18 NOTE — Clinical Social Work Note (Signed)
CSW met with pt to discuss d/c plan as a follow up from weekend visit. Pt reports he was doing well at home and plans to return at d/c. He refused to work with PT today but promised he would tomorrow. Message left for pt's son requesting return call.  Derenda Fennel, [place] [phone]

## 2012-02-18 NOTE — Consult Note (Signed)
  [id number] 

## 2012-02-18 NOTE — Evaluation (Signed)
Physical Therapy Evaluation Patient Details Name: [name] MRN: [id number] DOB: Jul 14, 1926 Today's Date: 02/18/2012 Time: 4098-1191 PT Time Calculation (min): 38 min  PT Assessment / Plan / Recommendation Clinical Impression  Pt was seen for eval.  He refused to try to get OOB, so it was difficult to get a clear picture of his functional status.  He was otherwise cooperative and participated in  initial Ther ex.  He has quite a bad bruise on his jaw from a recent fall,which would indicate intrinsic wealkness/balance deficit.  He states that he will work with [place] tomorrow after he has had some blood.    PT Assessment  Patient needs continued PT services    Follow Up Recommendations  Other (comment) (to be determined)    Barriers to Discharge None per pt report, his son is at at home with him most all of the time    Equipment Recommendations  None recommended by PT    Recommendations for Other Services     Frequency Min 3X/week    Precautions / Restrictions Precautions Precautions: Fall Restrictions Weight Bearing Restrictions: No   Pertinent Vitals/Pain       Mobility  Bed Mobility Bed Mobility: Not assessed Details for Bed Mobility Assistance: Pt refuses to try to get up OOB...states that he is too tired Transfers Transfers: Not assessed    Exercises General Exercises - Lower Extremity Ankle Circles/Pumps: AROM;Strengthening;Both;10 reps;Supine Short Arc Quad: AROM;Both;10 reps;Supine Heel Slides: AROM;Strengthening;Both;10 reps;Supine Hip ABduction/ADduction: AROM;Strengthening;Both;10 reps;Supine   PT Diagnosis: Difficulty walking;Generalized weakness  PT Problem List: Decreased strength;Decreased activity tolerance;Decreased mobility;Decreased safety awareness;Decreased knowledge of precautions PT Treatment Interventions: Gait training;Therapeutic activities;Therapeutic exercise;Patient/family education   PT Goals Acute Rehab PT Goals PT Goal Formulation:  With patient Time For Goal Achievement: 04/03/12 Potential to Achieve Goals: Good Pt will go Supine/Side to Sit: with modified independence;with HOB 0 degrees PT Goal: Supine/Side to Sit - Progress: Goal set today Pt will go Sit to Supine/Side: with modified independence;with HOB 0 degrees PT Goal: Sit to Supine/Side - Progress: Goal set today Pt will go Sit to Stand: with supervision;with upper extremity assist PT Goal: Sit to Stand - Progress: Goal set today Pt will go Stand to Sit: with supervision;with upper extremity assist PT Goal: Stand to Sit - Progress: Goal set today Pt will Ambulate: 16 - 50 feet;with supervision;with rolling walker PT Goal: Ambulate - Progress: Goal set today  Visit Information  Last PT Received On: 02/18/12    Subjective Data  Subjective: They're surrposed to give me some blood this afternoon Patient Stated Goal: none stated   Prior Functioning  Home Living Lives With: Son Available Help at Discharge: Family Type of Home: House Home Access: Stairs to enter Secretary/administrator of Steps: 3 Entrance Stairs-Rails: Can reach both;Right;Left Home Layout: One level Firefighter: Standard Home Adaptive Equipment: Environmental consultant - four wheeled;Walker - rolling;Bedside commode/3-in-1 Prior Function Level of Independence: Independent with assistive device(s) Able to Take Stairs?: Yes Driving: No Vocation: Retired Musician: Clinical cytogeneticist  Overall Cognitive Status: Appears within functional limits for tasks assessed/performed Arousal/Alertness: Awake/alert Orientation Level: Appears intact for tasks assessed Behavior During Session: [facility] for tasks performed    Extremity/Trunk Assessment Right Lower Extremity Assessment RLE ROM/Strength/Tone: WFL for tasks assessed RLE Sensation: History of peripheral neuropathy RLE Coordination: WFL - gross motor Left Lower Extremity Assessment LLE ROM/Strength/Tone: WFL for tasks assessed LLE  Sensation: History of peripheral neuropathy LLE Coordination: WFL - gross motor   Balance  Balance Balance Assessed: No  End of Session PT - End of Session Equipment Utilized During Treatment: Gait belt Activity Tolerance: Patient limited by fatigue Patient left: in bed;with call bell/phone within reach;with bed alarm set  GP     Konrad Penta 02/18/2012, 2:19 PM

## 2012-02-18 NOTE — Progress Notes (Signed)
UR Chart Review Completed  

## 2012-02-18 NOTE — Clinical Social Work Note (Signed)
CSW received return call from pt's son Major. Pt and son are well known to CSW from previous admission. He reports that pt continues to refuse to get out of bed regularly at home. Major wants pt placed at a SNF. CSW explained the process for SNF and that at this time he does not have a skilled need for insurance to authorize this level of care. Major reported he knows someone personally that works at a hospital that said all he had to have was a MD order. When CSW corrected this statement, Major became upset and said that he didn't understand why he was getting conflicting information. The option of ALF was mentioned, but Major states he would never send pt there as "he would die." He is aware that pt would have to pay privately for this. Last hospitalization, son refused to assist in applying for Medicaid and an ALF was found that would work with pt's monthly check. CSW also explained that pt will need to be agreeable to d/c plan to which Major responded that he was POA and could make all decisions. Pt's son educated on [place] and that unless pt is determined to lack capacity, pt is able to make decisions for himself. Major said he was going to talk to a friend about situation and may have this friend Okey Regal) call CSW. CSW will get permission from pt to discuss d/c plan with Okey Regal if she calls.   Derenda Fennel, [place] [phone]

## 2012-02-18 NOTE — Progress Notes (Signed)
Subjective: Patient still complains of constipation. His H/H has further dropped. Stool occult blood is negative and hematuria, hematemesis or malena. Objective: Vital signs in last 24 hours: Temp:  [97.5 F (36.4 C)-98.3 F (36.8 C)] 97.5 F (36.4 C) (08/26 0432) Pulse Rate:  [57-60] 57  (08/26 0432) Resp:  [20] 20  (08/26 0432) BP: (113-130)/(65-72) 113/65 mmHg (08/26 0432) SpO2:  [98 %-100 %] 100 % (08/26 0432) Weight change:  Last BM Date: 02/18/12  Intake/Output from previous day: 08/25 0701 - 08/26 0700 In: 980 [P.O.:980] Out: 1075 [Urine:1075]  PHYSICAL EXAM General appearance: alert and no distress Resp: clear to auscultation bilaterally Cardio: S1, S2 normal GI: soft, non-tender; bowel sounds normal; no masses,  no organomegaly Extremities: extremities normal, atraumatic, no cyanosis or edema  Lab Results:    @labtest @ [facility] No results found for this basename: PHART,PCO2,PO2ART,TCO2,HCO3 in the last 72 hours CULTURES Recent Results (from the past 240 hour(s))  CULTURE, BLOOD (ROUTINE X 2)     Status: Normal (Preliminary result)   Collection Time   02/16/12  6:05 PM      Component Value Range Status Comment   Specimen Description BLOOD LEFT HAND   Final    Special Requests BOTTLES DRAWN AEROBIC AND ANAEROBIC 6CC   Final    Culture PENDING   Incomplete    Report Status PENDING   Incomplete   CULTURE, BLOOD (ROUTINE X 2)     Status: Normal (Preliminary result)   Collection Time   02/16/12  6:14 PM      Component Value Range Status Comment   Specimen Description RIGHT ANTECUBITAL   Final    Special Requests BOTTLES DRAWN AEROBIC AND ANAEROBIC [facility]   Final    Culture PENDING   Incomplete    Report Status PENDING   Incomplete    Studies/Results: Dg Chest 1 View  02/16/2012  *RADIOLOGY REPORT*  Clinical Data: Altered level of consciousness.  CHEST - 1 VIEW  Comparison: 01/08/2012  Findings: There are old right rib fractures and chronic deformity of the proximal  right humerus.  The lungs are clear without airspace disease and no evidence for pneumothorax.  Heart and mediastinum are within normal limits.  The trachea is midline.  IMPRESSION: No acute chest findings.  Old fractures.   Original Report Authenticated By: Richarda Overlie, M.D.    Dg Wrist Complete Right  02/16/2012  *RADIOLOGY REPORT*  Clinical Data: Altered level of consciousness and fall.  RIGHT WRIST - COMPLETE 3+ VIEW  Comparison: 04/19/2011  Findings: Three views of the right wrist were obtained.  Cortical thickening of the distal radius and ulna are consistent with old fractures and stable.  No evidence for acute fracture or dislocation.  IMPRESSION: No acute bony abnormality.  Probable old fractures involving the distal radius and ulna.   Original Report Authenticated By: Richarda Overlie, M.D.    Dg Abd 1 View  02/17/2012  *RADIOLOGY REPORT*  Clinical Data: Constipation.  ABDOMEN - 1 VIEW  Comparison: None.  Findings: The bowel gas pattern is normal.  Stool burden is unremarkable.  Surgical clips in the pelvis noted.  There is some degenerative disease about the hips.  IMPRESSION: Negative exam.   Original Report Authenticated By: Bernadene Bell. Maricela Curet, M.D.    Ct Head Wo Contrast  02/16/2012  *RADIOLOGY REPORT*  Clinical Data: Larey Seat, nosebleed. Headache.  CT HEAD WITHOUT CONTRAST  Technique:  Contiguous axial images were obtained from the base of the skull through the vertex without contrast.  Comparison: 01/08/2012  Findings: There is no evidence for acute infarction, intracranial hemorrhage, mass lesion, hydrocephalus, or extra-axial fluid. Moderate to severe atrophy is present.  Advanced chronic microvascular ischemic change is present in the periventricular and subcortical white matter.  The calvarium is intact.  There is advanced vascular calcification. Mild bilateral ethmoid sinus disease.  There is an air-fluid level in the right maxillary sinus which is new from previous study. There appears to be a  small radiopaque foreign body in the soft tissues over the right zygoma (image 4, series 3).  Within limits of visualization of the upper face, no fracture is seen.  IMPRESSION: Stable atrophy and chronic microvascular ischemic change.  No skull fracture or intracranial hemorrhage.  Right maxillary sinus air-fluid level.  Suspect tiny radiopaque foreign body superficial to the right zygoma.  See comments above.   Original Report Authenticated By: Elsie Stain, M.D.    Ct Abdomen Pelvis W Contrast  02/16/2012  *RADIOLOGY REPORT*  Clinical Data: Fall, abdominal pain  CT ABDOMEN AND PELVIS WITH CONTRAST  Technique:  Multidetector CT imaging of the abdomen and pelvis was performed following the standard protocol during bolus administration of intravenous contrast.  Contrast: 80mL OMNIPAQUE IOHEXOL 300 MG/ML  SOLN  Comparison: None.  Findings: Small bilateral pleural effusions, left greater than right.  Liver, spleen, pancreas, and adrenal glands are within normal limits.  Gallbladder is notable for cholelithiasis, without associated inflammatory changes.  Left renal atrophy/cortical scarring.  Right kidney is unremarkable.  No hydronephrosis.  No evidence of bowel obstruction.  Atherosclerotic calcifications of the abdominal aorta and branch vessels.  Vague nonspecific mesenteric stranding (series 2/image 32), without discrete adenopathy or fluid.  No hemoperitoneum.  No free air.  Status post prostatectomy with pelvic lymph node dissection.  Bladder is within normal limits.  Mild sclerosis involving the left ninth rib (series 2/image 23), nonspecific.  Old fracture deformity of the left 12th rib (series 2/image 27).  Healing fractures of the right lateral 5th-7th ribs. Mild to moderate compression deformity involving the L3 vertebral body (sagittal image 60), likely chronic.  IMPRESSION: No evidence of acute traumatic injury to the abdomen/pelvis.  Healing right lateral 5th-7th rib fractures. Old left 12th rib  fracture.  Mild to moderate compression deformity of the L3 vertebral body, likely chronic.   Original Report Authenticated By: Charline Bills, M.D.     Medications: I have reviewed the patient's current medications.  Assesment: 1. Change in mental status due to multifactorial causes. 2. Recurrent hypoglycemia 3.Dementia 4. Constipation 5. DM type II 6. Anemia etiology??   Active Problems:  * No active hospital problems. *     Plan: We will type , cross match and transfuse 2  [facility] Hematology consult Continue current treatment.  LOS: 2 days   Kyrus Hyde 02/18/2012, 7:54 AM

## 2012-02-18 NOTE — Progress Notes (Signed)
Problem #1 pancytopenia Problem #2 dementia, mild versus severe difficulty hearing Problem #3 renal insufficiency Problem #4 diabetes mellitus Problem #5 hypertension This gentleman was admitted with the above problems and it appears that his blood counts have worsened to what they were several months ago. His white count and platelets have been normal in the past but has been mildly anemic. This anemia has not been clearly specify as to etiology and I suspect it is multifactorial in nature. He is not perfect historian but he does answer most questions very appropriately. He has iron studies consistent with anemia of chronic disease picture but his B12 level is on the low end of normal, his albumin is low, and I wonder if some of this is nutritional in nature.  His vital signs are recorded. He is very hard of hearing. He is edentulous. He does have 2 ecchymoses within his mouth one of which is on the hard palate. He has no obvious lymphadenopathy. He has no gynecomastia. His heart shows no distinct murmur rub or gallop. His abdomen is soft and nontender without organomegaly. Bowel sounds are very active. He has intact facial symmetry but eye changes consistent with prior cataract operations. He has pulses in his feet which are 1-2+. He states he is right handed. Nails are unremarkable. His lungs are clear.  I will check a few other blood tests but he is already receiving blood products today. I will see him in followup. He does state that he has been living with his son here in [place]. He states he was born in [place]. He states he is divorced times many years and that he also has a daughter. 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

## 2012-02-19 LAB — CBC
HCT: 33.1 % — ABNORMAL LOW (ref 39.0–52.0)
Hemoglobin: 11.2 g/dL — ABNORMAL LOW (ref 13.0–17.0)
MCH: 30.1 pg (ref 26.0–34.0)
MCHC: 33.8 g/dL (ref 30.0–36.0)
MCV: 89 fL (ref 78.0–100.0)
RBC: 3.72 MIL/uL — ABNORMAL LOW (ref 4.22–5.81)

## 2012-02-19 LAB — TYPE AND SCREEN
ABO/RH(D): O POS
Antibody Screen: NEGATIVE
Unit division: 0
Unit division: 0

## 2012-02-19 LAB — HEPATIC FUNCTION PANEL
ALT: 5 U/L (ref 0–53)
AST: 12 U/L (ref 0–37)
Albumin: 2.9 g/dL — ABNORMAL LOW (ref 3.5–5.2)
Bilirubin, Direct: 0.1 mg/dL (ref 0.0–0.3)
Total Protein: 5.9 g/dL — ABNORMAL LOW (ref 6.0–8.3)

## 2012-02-19 LAB — GLUCOSE, CAPILLARY
Glucose-Capillary: 92 mg/dL (ref 70–99)
Glucose-Capillary: 125 mg/dL — ABNORMAL HIGH (ref 70–99)

## 2012-02-19 MED ORDER — CYANOCOBALAMIN 1000 MCG/ML IJ SOLN
1000.0000 ug | INTRAMUSCULAR | Status: DC
  Administered 2012-02-20: 1000 ug via INTRAMUSCULAR
  Filled 2012-02-19: qty 1

## 2012-02-19 NOTE — Clinical Social Work Note (Signed)
CSW left message for pt's son. Pt ambulated over 400' with supervision today. Pt is continuing to insist that he is going home. CM spoke with son when he called back and plan is for pt to return home with home health. CSW will sign off unless further needs arise prior to d/c.   Derenda Fennel, LCSW [phone]

## 2012-02-19 NOTE — Progress Notes (Signed)
Physical Therapy Treatment Patient Details Name: [name] MRN: [id number] DOB: [birth year] Today's Date: 02/19/2012 Time: 7829-5621 PT Time Calculation (min): 24 min 1 therex 1 gait  PT Assessment / Plan / Recommendation Comments on Treatment Session Patient was MI for bed mobility and able to complete 280' + 160' of gait training with RW;supervision today, needing rest between increments. Patient c/o of dizziness x2 during seated rests today and stated his balance is "off" sometimes; no LOB experienced today during treatment however. Patient lives with son.        Follow Up Recommendations       Barriers to Discharge        Equipment Recommendations       Recommendations for Other Services    Frequency     Plan      Precautions / Restrictions     Pertinent Vitals/Pain     Mobility  Bed Mobility Bed Mobility: Supine to Sit Supine to Sit: 6: Modified independent (Device/Increase time) Transfers Transfers: Sit to Stand;Stand to Sit Sit to Stand: 4: Min guard Stand to Sit: 4: Min guard Details for Transfer Assistance: verbal for safety;backing to surface before sitting Ambulation/Gait Ambulation/Gait Assistance: 5: Supervision Ambulation Distance (Feet): 440 Feet (increments of 280' and 160') Assistive device: Rolling walker Ambulation/Gait Assistance Details: cues to slow down Gait Pattern: Shuffle;Trunk flexed;Within Functional Limits Gait velocity: slow Stairs: No Wheelchair Mobility Wheelchair Mobility: No    Exercises General Exercises - Upper Extremity Shoulder Horizontal ABduction: Both;10 reps Shoulder Horizontal ADduction: 10 reps;Both General Exercises - Lower Extremity Long Arc Quad: Both;15 reps Hip ABduction/ADduction: Standing;Both;10 reps (with unilateral UE support) Mini-Sqauts: 10 reps   PT Diagnosis:    PT Problem List:   PT Treatment Interventions:     PT Goals    Visit Information  Last PT Received On: 02/19/12    Subjective  Data      Cognition       Balance     End of Session PT - End of Session Equipment Utilized During Treatment: Gait belt Activity Tolerance: Patient tolerated treatment well Patient left: in bed;with call bell/phone within reach;with bed alarm set   GP     [name] 02/19/2012, 12:20 PM

## 2012-02-19 NOTE — Progress Notes (Signed)
[name], [name]               ACCOUNT NO.:  [id number]  MEDICAL RECORD NO.:  [id number]  LOCATION:  A327                          FACILITY:  [facility]  PHYSICIAN:  Purcell Nails, MD DATE OF BIRTH:  1926/08/26  DATE OF PROCEDURE:  02/19/2012 DATE OF DISCHARGE:                                PROGRESS NOTE   REASON FOR FOLLOWUP:  Hypoglycemia.  SUBJECTIVE:  The patient feels better.  No further hypoglycemia documented.  He is cooperating with oral nutrition.  OBJECTIVE:  GENERAL:  He is alert and oriented x3. VITAL SIGNS:  This morning blood pressure 153/71, pulse rate 58, temperature 98.4. HEENT:  Well hydrated. NECK:  Negative for JVD. CHEST:  Clear to auscultation bilaterally. CARDIOVASCULAR:  Normal S1 and S2.  No murmur.  No gallop. ABDOMEN:  Soft and nontender. EXTREMITIES:  No edema.  Labs show A1c of 5.3%.  Glycemic profile ranged from 92-132.  ASSESSMENT: 1. Hypoglycemia resolved. 2. Type 2 diabetes, well controlled.  Last A1c of 5.3% 3. Anemia status post transfusion.  PLAN:  Since the patient's diabetes is tightly controlled, he will not need any further treatment.  I will stop his insulin and stop his glipizide as well.  I would like to see him as an outpatient.  He will follow up in 1 week for his hypoglycemia.  Continue to monitor blood glucose before meals and at bedtime until he leaves hospital.          ______________________________ Purcell Nails, MD     GN/MEDQ  D:  02/19/2012  T:  02/19/2012  Job:  [id number]

## 2012-02-19 NOTE — Progress Notes (Signed)
Patient ID: [name], male   DOB: [birth year], 77 y.o.   MRN: [id number]

## 2012-02-19 NOTE — Progress Notes (Signed)
Subjective: He feels better. He was transfused two units PR BC. He has moved his bowel  Objective: Vital signs in last 24 hours: Temp:  [98.2 F (36.8 C)-98.5 F (36.9 C)] 98.4 F (36.9 C) (08/27 0555) Pulse Rate:  [54-62] 54  (08/27 0555) Resp:  [16-20] 18  (08/27 0555) BP: (110-149)/(66-78) 132/72 mmHg (08/27 0555) SpO2:  [96 %-97 %] 96 % (08/27 0555) Weight:  [69.5 kg (153 lb 3.5 oz)] 69.5 kg (153 lb 3.5 oz) (08/27 0138) Weight change:  Last BM Date: 02/18/12  Intake/Output from previous day: 08/26 0701 - 08/27 0700 In: 2370 [P.O.:960; I.V.:710; Blood:700] Out: 675 [Urine:675]  PHYSICAL EXAM General appearance: alert and no distress Resp: clear to auscultation bilaterally Cardio: S1, S2 normal GI: soft, non-tender; bowel sounds normal; no masses,  no organomegaly Extremities: extremities normal, atraumatic, no cyanosis or edema  Lab Results:    @labtest @ ABGS No results found for this basename: PHART,PCO2,PO2ART,TCO2,HCO3 in the last 72 hours CULTURES Recent Results (from the past 240 hour(s))  CULTURE, BLOOD (ROUTINE X 2)     Status: Normal (Preliminary result)   Collection Time   02/16/12  6:05 PM      Component Value Range Status Comment   Specimen Description BLOOD LEFT HAND   Final    Special Requests BOTTLES DRAWN AEROBIC AND ANAEROBIC 6CC   Final    Culture NO GROWTH 2 DAYS   Final    Report Status PENDING   Incomplete   CULTURE, BLOOD (ROUTINE X 2)     Status: Normal (Preliminary result)   Collection Time   02/16/12  6:14 PM      Component Value Range Status Comment   Specimen Description RIGHT ANTECUBITAL   Final    Special Requests BOTTLES DRAWN AEROBIC AND ANAEROBIC 6CC   Final    Culture NO GROWTH 2 DAYS   Final    Report Status PENDING   Incomplete    Studies/Results: Dg Abd 1 View  02/17/2012  *RADIOLOGY REPORT*  Clinical Data: Constipation.  ABDOMEN - 1 VIEW  Comparison: None.  Findings: The bowel gas pattern is normal.  Stool burden is  unremarkable.  Surgical clips in the pelvis noted.  There is some degenerative disease about the hips.  IMPRESSION: Negative exam.   Original Report Authenticated By: Bernadene Bell. Maricela Curet, M.D.     Medications: I have reviewed the patient's current medications.  Assesment: 1. Change in mental status due to multifactorial causes. 2. Recurrent hypoglycemia 3.Dementia 4. Constipation 5. DM type II 6. Anemia etiology??   Active Problems:  * No active hospital problems. *     Plan: We will type , cross match and transfuse 2  Units PR [facility] Hematology consult appreciated We will discharge when placement is available. Continue current treatment.  LOS: 3 days   Darlette Dubow 02/19/2012, 8:20 AM

## 2012-02-19 NOTE — Progress Notes (Signed)
Pt placed on enteric precautions as a precaution due to several watery stools. Dr. Felecia Shelling approves test for [facility] when stool available. Pt has c/o abd cramping today. Milk of Magnesia and Miralax given today, pt has had several loose BMs, he continues to say his stomach cramps some, but is resting well in bed and chair much of the day, sleeping frequently. abd is soft, bowel sounds are active. [name]

## 2012-02-20 LAB — GLUCOSE, CAPILLARY: Glucose-Capillary: 113 mg/dL — ABNORMAL HIGH (ref 70–99)

## 2012-02-20 NOTE — Discharge Summary (Signed)
Physician Discharge Summary  Patient ID: [name] MRN: [id number] DOB/AGE: [birth year] 78 y.o. Primary Care Physician:KNOWLTON,STEPHEN D, MD Admit date: 02/16/2012 Discharge date: 02/20/2012   Discharge Diagnoses:  1. Change in mental status due to multifactorial causes.  2. Recurrent hypoglycemia  3.Dementia  4. Constipation  5. DM type II  6. Anemia etiology unclear         Active Problems:  * No active hospital problems. *    Medication List  As of 02/20/2012  8:27 AM   STOP taking these medications         glipiZIDE 5 MG tablet         TAKE these medications         amLODipine 10 MG tablet   Commonly known as: NORVASC   Take 5 mg by mouth daily.      calcium-vitamin D 500-200 MG-UNIT per tablet   Commonly known as: OSCAL WITH D   Take 1 tablet by mouth daily.      lubiprostone 24 MCG capsule   Commonly known as: AMITIZA   Take 24 mcg by mouth daily as needed. For constipation      metoprolol 100 MG tablet   Commonly known as: LOPRESSOR   Take 50 mg by mouth 2 (two) times daily.      Tamsulosin HCl 0.4 MG Caps   Commonly known as: FLOMAX   Take 0.4 mg by mouth at bedtime.            Discharged Condition: stable    Consults: Endocrine and  hematology  Significant Diagnostic Studies: Dg Chest 1 View  02/16/2012  *RADIOLOGY REPORT*  Clinical Data: Altered level of consciousness.  CHEST - 1 VIEW  Comparison: 01/08/2012  Findings: There are old right rib fractures and chronic deformity of the proximal right humerus.  The lungs are clear without airspace disease and no evidence for pneumothorax.  Heart and mediastinum are within normal limits.  The trachea is midline.  IMPRESSION: No acute chest findings.  Old fractures.   Original Report Authenticated By: Richarda Overlie, M.D.    Dg Wrist Complete Right  02/16/2012  *RADIOLOGY REPORT*  Clinical Data: Altered level of consciousness and fall.  RIGHT WRIST - COMPLETE 3+ VIEW  Comparison: 04/19/2011   Findings: Three views of the right wrist were obtained.  Cortical thickening of the distal radius and ulna are consistent with old fractures and stable.  No evidence for acute fracture or dislocation.  IMPRESSION: No acute bony abnormality.  Probable old fractures involving the distal radius and ulna.   Original Report Authenticated By: Richarda Overlie, M.D.    Dg Abd 1 View  02/17/2012  *RADIOLOGY REPORT*  Clinical Data: Constipation.  ABDOMEN - 1 VIEW  Comparison: None.  Findings: The bowel gas pattern is normal.  Stool burden is unremarkable.  Surgical clips in the pelvis noted.  There is some degenerative disease about the hips.  IMPRESSION: Negative exam.   Original Report Authenticated By: Bernadene Bell. Maricela Curet, M.D.    Ct Head Wo Contrast  02/16/2012  *RADIOLOGY REPORT*  Clinical Data: Larey Seat, nosebleed. Headache.  CT HEAD WITHOUT CONTRAST  Technique:  Contiguous axial images were obtained from the base of the skull through the vertex without contrast.  Comparison: 01/08/2012  Findings: There is no evidence for acute infarction, intracranial hemorrhage, mass lesion, hydrocephalus, or extra-axial fluid. Moderate to severe atrophy is present.  Advanced chronic microvascular ischemic change is present in the periventricular and subcortical white matter.  The calvarium is intact.  There is advanced vascular calcification. Mild bilateral ethmoid sinus disease.  There is an air-fluid level in the right maxillary sinus which is new from previous study. There appears to be a small radiopaque foreign body in the soft tissues over the right zygoma (image 4, series 3).  Within limits of visualization of the upper face, no fracture is seen.  IMPRESSION: Stable atrophy and chronic microvascular ischemic change.  No skull fracture or intracranial hemorrhage.  Right maxillary sinus air-fluid level.  Suspect tiny radiopaque foreign body superficial to the right zygoma.  See comments above.   Original Report Authenticated By:  Elsie Stain, M.D.    Ct Abdomen Pelvis W Contrast  02/16/2012  *RADIOLOGY REPORT*  Clinical Data: Fall, abdominal pain  CT ABDOMEN AND PELVIS WITH CONTRAST  Technique:  Multidetector CT imaging of the abdomen and pelvis was performed following the standard protocol during bolus administration of intravenous contrast.  Contrast: 80mL OMNIPAQUE IOHEXOL 300 MG/ML  SOLN  Comparison: None.  Findings: Small bilateral pleural effusions, left greater than right.  Liver, spleen, pancreas, and adrenal glands are within normal limits.  Gallbladder is notable for cholelithiasis, without associated inflammatory changes.  Left renal atrophy/cortical scarring.  Right kidney is unremarkable.  No hydronephrosis.  No evidence of bowel obstruction.  Atherosclerotic calcifications of the abdominal aorta and branch vessels.  Vague nonspecific mesenteric stranding (series 2/image 32), without discrete adenopathy or fluid.  No hemoperitoneum.  No free air.  Status post prostatectomy with pelvic lymph node dissection.  Bladder is within normal limits.  Mild sclerosis involving the left ninth rib (series 2/image 23), nonspecific.  Old fracture deformity of the left 12th rib (series 2/image 27).  Healing fractures of the right lateral 5th-7th ribs. Mild to moderate compression deformity involving the L3 vertebral body (sagittal image 60), likely chronic.  IMPRESSION: No evidence of acute traumatic injury to the abdomen/pelvis.  Healing right lateral 5th-7th rib fractures. Old left 12th rib fracture.  Mild to moderate compression deformity of the L3 vertebral body, likely chronic.   Original Report Authenticated By: Charline Bills, M.D.     Lab Results: Basic Metabolic Panel:  [facility] 02/18/12 0448  NA 138  K 5.2*  CL 111  CO2 24  GLUCOSE 115*  BUN 26*  CREATININE 1.79*  CALCIUM 9.3  MG --  PHOS --   Liver Function Tests:  Basename 02/19/12 0512 02/18/12 0448  AST 12 11  ALT 5 <5  ALKPHOS 72 66  BILITOT 0.5  0.2*  PROT 5.9* 5.4*  ALBUMIN 2.9* 2.6*     CBC:  Basename 02/19/12 0512 02/18/12 0448  WBC 3.9* 3.2*  NEUTROABS -- --  HGB 11.2* 7.8*  HCT 33.1* 25.2*  MCV 89.0 92.6  PLT 121* 118*    Recent Results (from the past 240 hour(s))  CULTURE, BLOOD (ROUTINE X 2)     Status: Normal (Preliminary result)   Collection Time   02/16/12  6:05 PM      Component Value Range Status Comment   Specimen Description BLOOD LEFT HAND   Final    Special Requests BOTTLES DRAWN AEROBIC AND ANAEROBIC 6CC   Final    Culture NO GROWTH 3 DAYS   Final    Report Status PENDING   Incomplete   CULTURE, BLOOD (ROUTINE X 2)     Status: Normal (Preliminary result)   Collection Time   02/16/12  6:14 PM      Component Value Range Status Comment  Specimen Description BLOOD RIGHT ANTECUBITAL   Final    Special Requests BOTTLES DRAWN AEROBIC AND ANAEROBIC 6CC   Final    Culture NO GROWTH 3 DAYS   Final    Report Status PENDING   Incomplete      Hospital Course:  This is an 78 years old male patient admitted due to change in mental status. He had recurrent episode of hypoglycemia. He had also anemia. He was transfused.  Endocrine and hematology consult done. He is off his diabetic medication according endocrine recommendation. Patient has improved. He will be discharge with home health.  Discharge Exam: Blood pressure 129/64, pulse 48, temperature 98 F (36.7 C), temperature source Oral, resp. rate 18, height 5\' 8"  (1.727 m), weight 67.3 kg (148 lb 5.9 oz), SpO2 96.00%.   Disposition:  home    Follow-up Information    Follow up with NIDA,GEBRESELASSIE, MD in 1 week.   Contact information:   [address] [place] [address] [phone]       Follow up with Milana Obey, MD.   Contact information:   [address] [place] [address] [phone]          Signed: Avon Gully   02/20/2012, 8:27 AM

## 2012-02-20 NOTE — Progress Notes (Signed)
Physical Therapy Treatment Patient Details Name: [name] MRN: [id number] DOB: [birth year] Today's Date: 02/20/2012 Time: 4098-1191 PT Time Calculation (min): 20 min  PT Assessment / Plan / Recommendation Comments on Treatment Session  Nursing notified of increased pain at end of session; Pt. requested to return to bed.  Nursing reported drop in BP while ambulating to 30's but increased to 50's after returning supine.  Pt. voiced feeling fine except for his stomach pain.  Nursing aware.  No LOB with gait today using walker.    Follow Up Recommendations       Barriers to Discharge        Equipment Recommendations       Recommendations for Other Services    Frequency     Plan Frequency remains appropriate    Precautions / Restrictions Precautions Precautions: Fall   Pertinent Vitals/Pain     Mobility  Bed Mobility Bed Mobility: Sit to Supine;Supine to Sit Supine to Sit: 6: Modified independent (Device/Increase time) Sit to Supine: 6: Modified independent (Device/Increase time) Transfers Transfers: Sit to Stand;Stand to Sit Sit to Stand: 5: Supervision Stand to Sit: 4: Min guard Details for Transfer Assistance: Pt.requires tactile/VC's for sitting safely, using UE's to control descent Ambulation/Gait Ambulation/Gait Assistance: 5: Supervision Ambulation Distance (Feet): 250 Feet Assistive device: Rolling walker Ambulation/Gait Assistance Details: VC's for Larger stride/slower steps Gait Pattern: Decreased stance time - right;Decreased stance time - left;Shuffle;Trunk flexed     PT Goals    Visit Information  Last PT Received On: 02/20/12    Subjective Data  Subjective: Pt. states his stomach hurts, 8/10 pain today.  States he is also cold   Cognition  Overall Cognitive Status: Appears within functional limits for tasks assessed/performed Arousal/Alertness: Awake/alert Orientation Level: Appears intact for tasks assessed Behavior During Session: [facility] for  tasks performed Cognition - Other Comments: Hard of Hearing    Balance  Balance Balance Assessed: No  End of Session PT - End of Session Equipment Utilized During Treatment: Gait belt Activity Tolerance: Patient tolerated treatment well Patient left: in bed;with call bell/phone within reach;with bed alarm set Nurse Communication: Other (comment)     Lurena Nida, PTA/CLT 02/20/2012, 9:03 AM

## 2012-02-20 NOTE — Progress Notes (Signed)
Pt dc'd home. Discharge instructions given to the son and patient. Son lives next door to the patient. Pt verbalized understanding. [name] D 02/20/2012

## 2012-02-21 LAB — CULTURE, BLOOD (ROUTINE X 2): Culture: NO GROWTH

## 2012-03-12 ENCOUNTER — Emergency Department (HOSPITAL_COMMUNITY): Payer: Medicare PPO

## 2012-03-12 ENCOUNTER — Encounter (HOSPITAL_COMMUNITY): Payer: Self-pay | Admitting: Emergency Medicine

## 2012-03-12 ENCOUNTER — Emergency Department (HOSPITAL_COMMUNITY)
Admission: EM | Admit: 2012-03-12 | Discharge: 2012-03-12 | Disposition: A | Discharge status: Other Acute Inpatient Hospital | Payer: Medicare PPO | Attending: Emergency Medicine | Admitting: Emergency Medicine

## 2012-03-12 DIAGNOSIS — K5900 Constipation, unspecified: Secondary | ICD-10-CM | POA: Insufficient documentation

## 2012-03-12 DIAGNOSIS — W19XXXA Unspecified fall, initial encounter: Secondary | ICD-10-CM | POA: Insufficient documentation

## 2012-03-12 DIAGNOSIS — E1169 Type 2 diabetes mellitus with other specified complication: Secondary | ICD-10-CM | POA: Insufficient documentation

## 2012-03-12 DIAGNOSIS — Y9301 Activity, walking, marching and hiking: Secondary | ICD-10-CM | POA: Insufficient documentation

## 2012-03-12 DIAGNOSIS — R109 Unspecified abdominal pain: Secondary | ICD-10-CM | POA: Insufficient documentation

## 2012-03-12 DIAGNOSIS — E162 Hypoglycemia, unspecified: Secondary | ICD-10-CM

## 2012-03-12 DIAGNOSIS — S40019A Contusion of unspecified shoulder, initial encounter: Secondary | ICD-10-CM | POA: Insufficient documentation

## 2012-03-12 DIAGNOSIS — IMO0001 Reserved for inherently not codable concepts without codable children: Secondary | ICD-10-CM | POA: Insufficient documentation

## 2012-03-12 DIAGNOSIS — Z8673 Personal history of transient ischemic attack (TIA), and cerebral infarction without residual deficits: Secondary | ICD-10-CM | POA: Insufficient documentation

## 2012-03-12 DIAGNOSIS — Z79899 Other long term (current) drug therapy: Secondary | ICD-10-CM | POA: Insufficient documentation

## 2012-03-12 DIAGNOSIS — Z8739 Personal history of other diseases of the musculoskeletal system and connective tissue: Secondary | ICD-10-CM | POA: Insufficient documentation

## 2012-03-12 DIAGNOSIS — F0390 Unspecified dementia without behavioral disturbance: Secondary | ICD-10-CM | POA: Insufficient documentation

## 2012-03-12 DIAGNOSIS — R51 Headache: Secondary | ICD-10-CM | POA: Insufficient documentation

## 2012-03-12 LAB — CBC WITH DIFFERENTIAL/PLATELET
Basophils Absolute: 0 10*3/uL (ref 0.0–0.1)
Eosinophils Relative: 2 % (ref 0–5)
Lymphocytes Relative: 19 % (ref 12–46)
Lymphs Abs: 0.8 10*3/uL (ref 0.7–4.0)
MCV: 89.5 fL (ref 78.0–100.0)
Neutro Abs: 2.7 10*3/uL (ref 1.7–7.7)
Neutrophils Relative %: 69 % (ref 43–77)
Platelets: 133 10*3/uL — ABNORMAL LOW (ref 150–400)
RBC: 3.54 MIL/uL — ABNORMAL LOW (ref 4.22–5.81)
WBC: 3.9 10*3/uL — ABNORMAL LOW (ref 4.0–10.5)

## 2012-03-12 LAB — GLUCOSE, CAPILLARY
Glucose-Capillary: 135 mg/dL — ABNORMAL HIGH (ref 70–99)
Glucose-Capillary: 54 mg/dL — ABNORMAL LOW (ref 70–99)

## 2012-03-12 LAB — BASIC METABOLIC PANEL
CO2: 25 mEq/L (ref 19–32)
Chloride: 109 mEq/L (ref 96–112)
Glucose, Bld: 49 mg/dL — ABNORMAL LOW (ref 70–99)
Potassium: 5.3 mEq/L — ABNORMAL HIGH (ref 3.5–5.1)
Sodium: 139 mEq/L (ref 135–145)

## 2012-03-12 MED ORDER — DEXTROSE 50 % IV SOLN
1.0000 | Freq: Once | INTRAVENOUS | Status: AC
  Administered 2012-03-12: 50 mL via INTRAVENOUS

## 2012-03-12 MED ORDER — DEXTROSE 50 % IV SOLN
INTRAVENOUS | Status: AC
  Administered 2012-03-12: 50 mL via INTRAVENOUS
  Filled 2012-03-12: qty 50

## 2012-03-12 MED ORDER — SODIUM CHLORIDE 0.9 % IV BOLUS (SEPSIS)
500.0000 mL | Freq: Once | INTRAVENOUS | Status: AC
  Administered 2012-03-12: 500 mL via INTRAVENOUS
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
## 2012-03-12 NOTE — ED Notes (Signed)
Pt fallen at nursing facility. Complaining of SOB and abd pain. Pt had syncopal episodes one witnessed by ems. Pt initial BP was 60s systolic.

## 2012-09-07 IMAGING — CR DG SHOULDER 2+V*R*
5 series · 5 of 5 positions shown · non-contrast
Comparison: 04/19/2011

CLINICAL DATA: Right shoulder fracture, follow-up

RIGHT SHOULDER - 2+ VIEW

[view not recorded (1 of 5)]
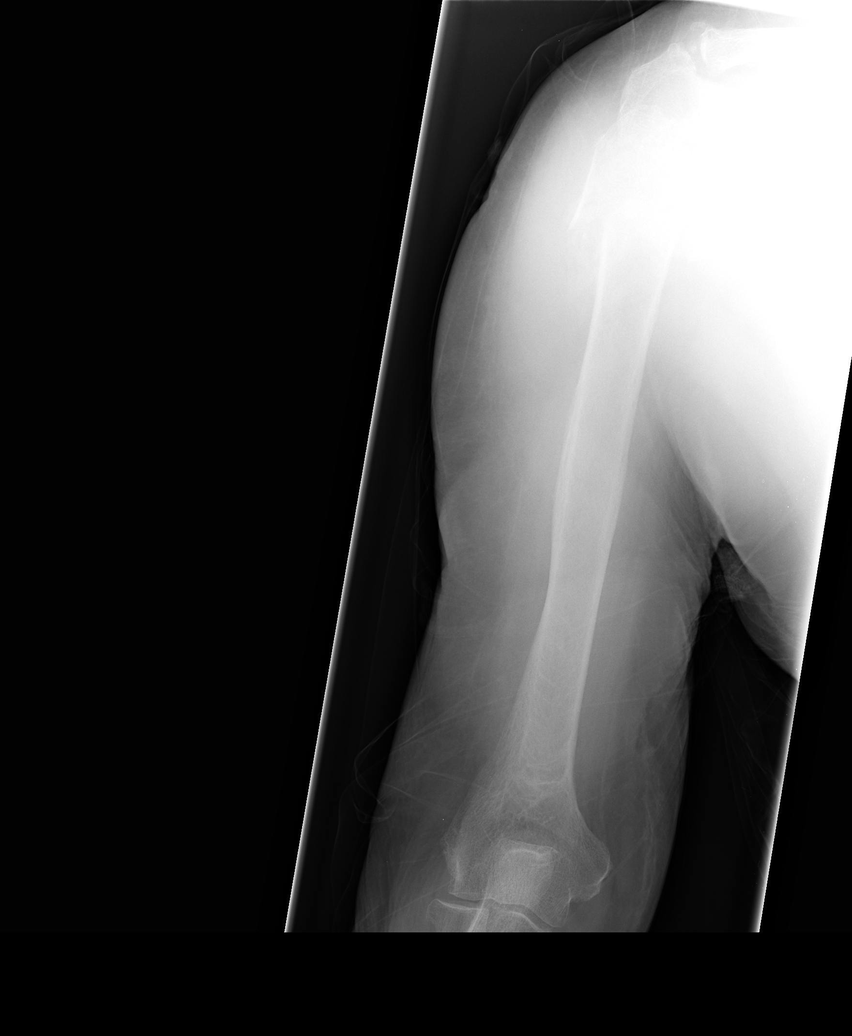

[view not recorded (2 of 5)]
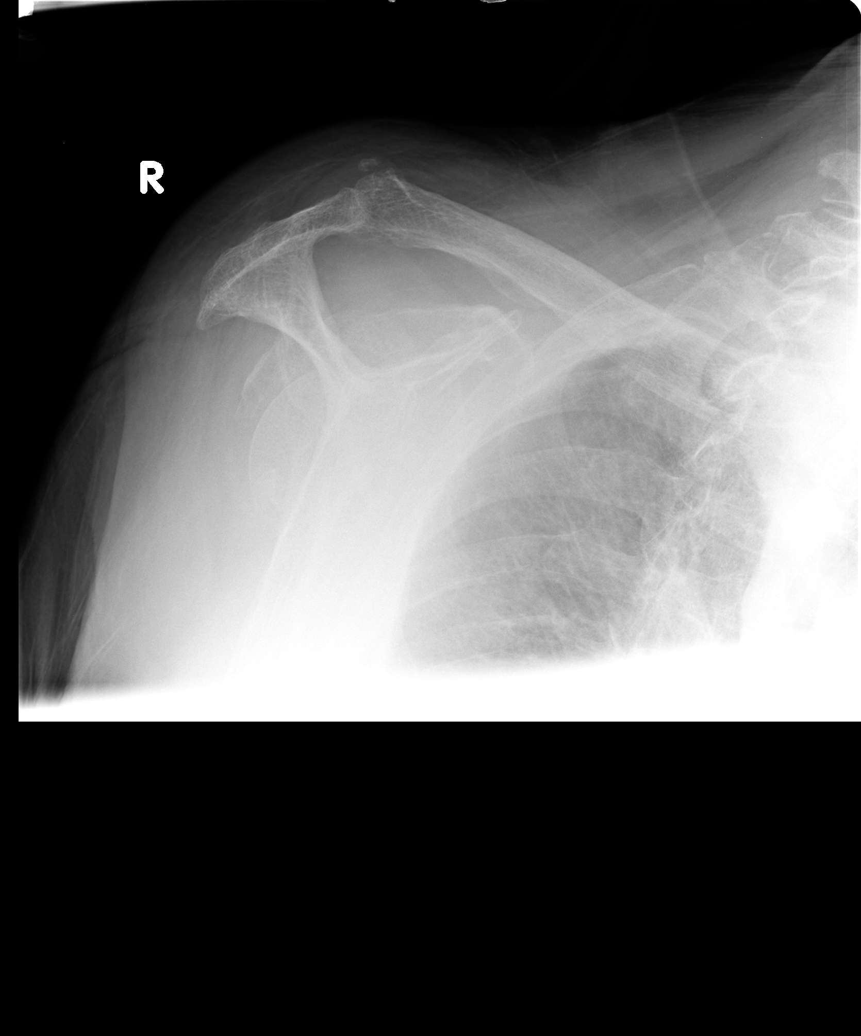

[view not recorded (3 of 5)]
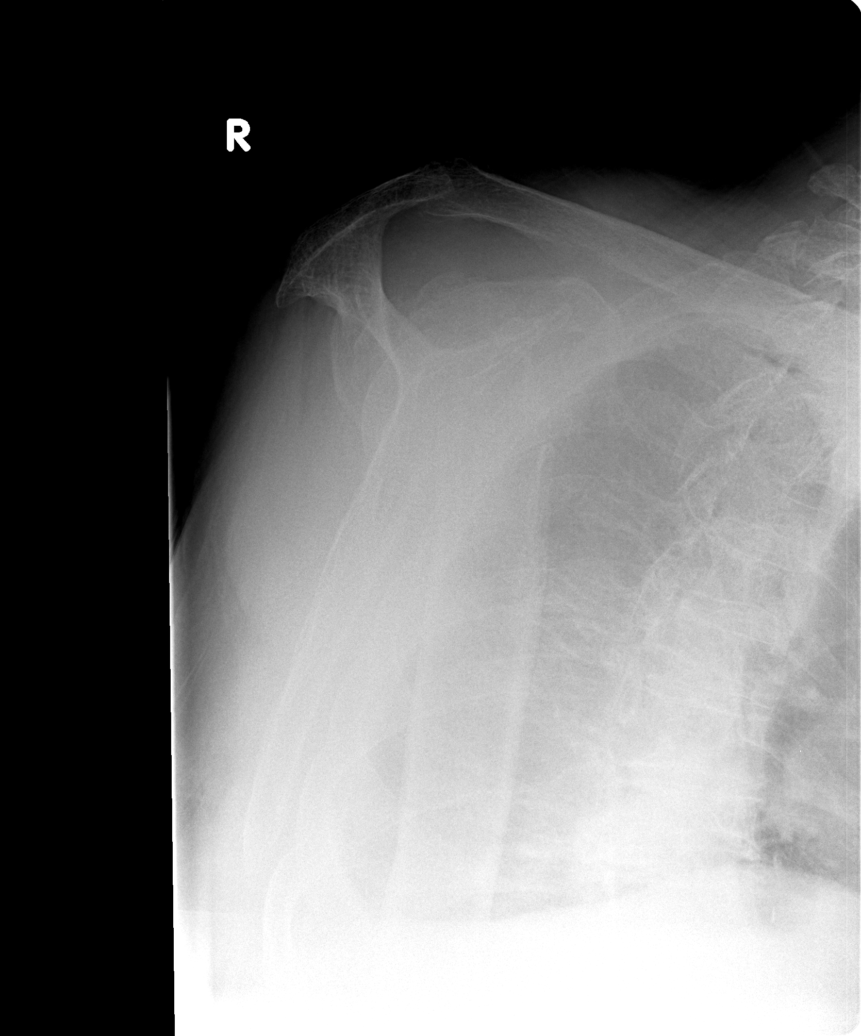

[view not recorded (4 of 5)]
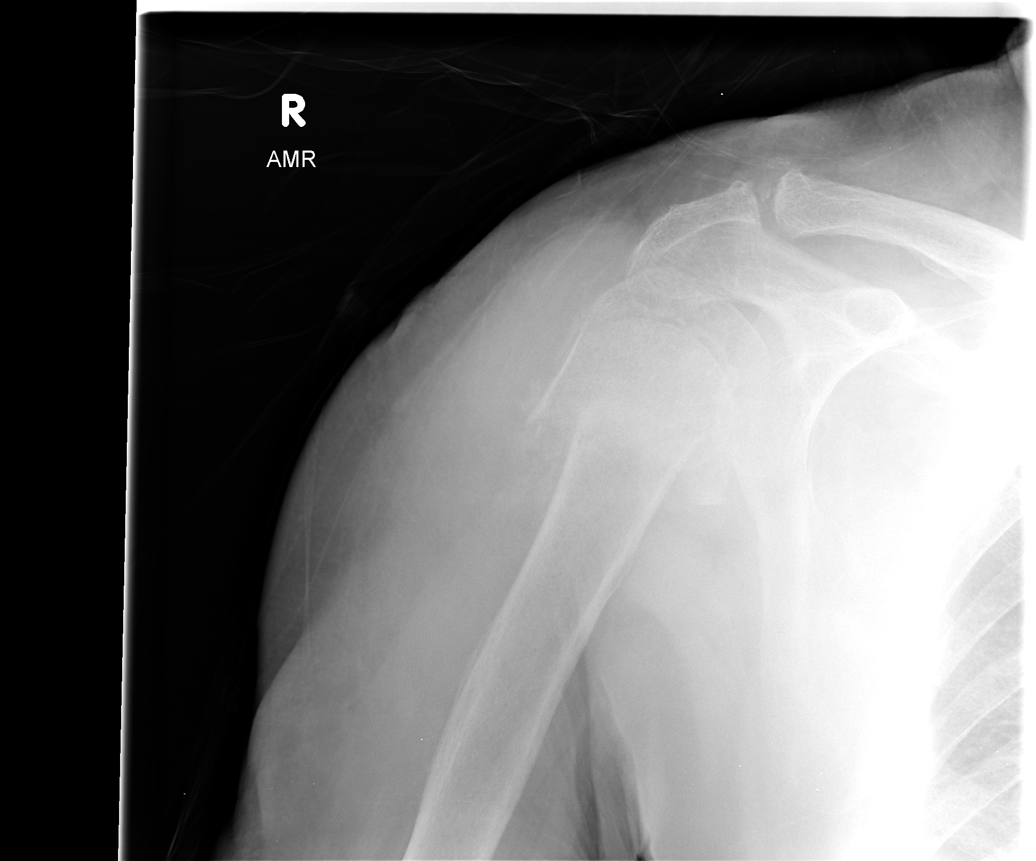

[view not recorded (5 of 5)]
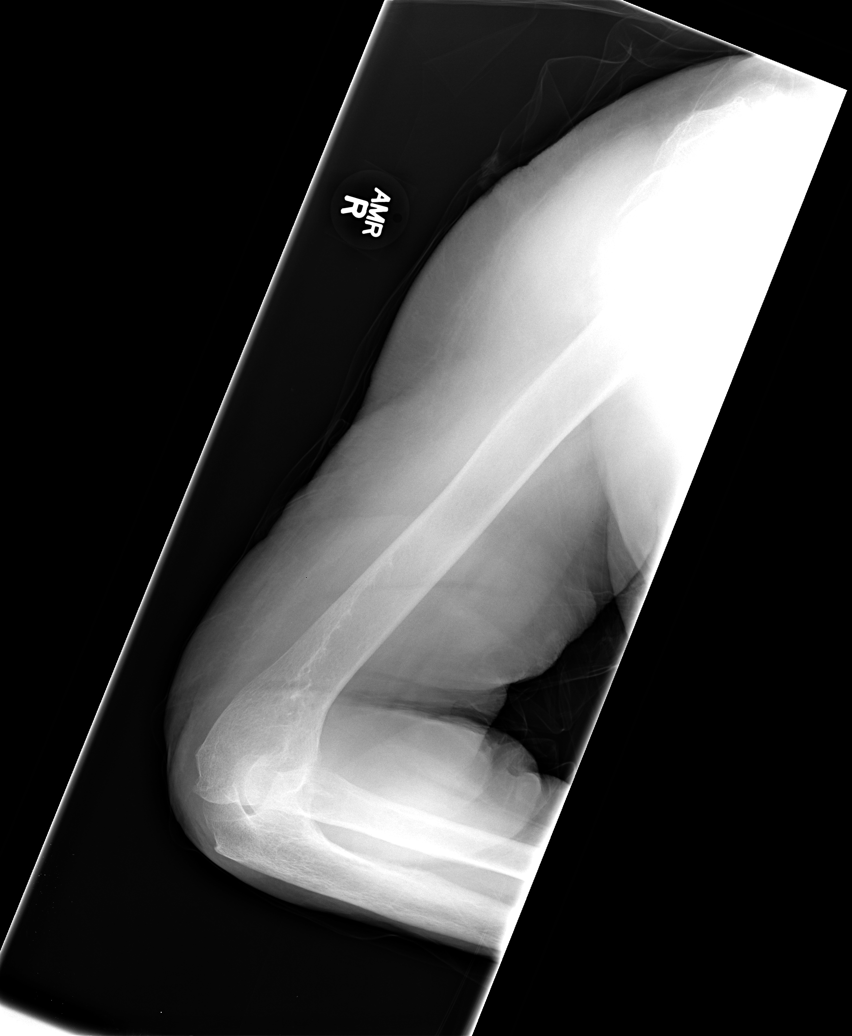

[5 of 5 positions shown; findings below may reference images not displayed]

FINDINGS: Examination limited by the degree of osseous demineralization.
AC joint alignment normal.
Again identified displaced proximal right humeral fracture.
Small amount of callus.
No dislocation.
Shaft of humerus appears intact.
IMPRESSION: Healing displaced proximal right humeral fracture.
Significant osseous demineralization.

## 2012-11-24 ENCOUNTER — Emergency Department (HOSPITAL_COMMUNITY): Payer: Medicare Other

## 2012-11-24 ENCOUNTER — Emergency Department (HOSPITAL_COMMUNITY)
Admission: EM | Admit: 2012-11-24 | Discharge: 2012-11-24 | Disposition: A | Discharge status: Home / Self Care | Payer: Medicare Other | Attending: Emergency Medicine | Admitting: Emergency Medicine

## 2012-11-24 ENCOUNTER — Encounter (HOSPITAL_COMMUNITY): Payer: Self-pay | Admitting: *Deleted

## 2012-11-24 DIAGNOSIS — Y9389 Activity, other specified: Secondary | ICD-10-CM | POA: Insufficient documentation

## 2012-11-24 DIAGNOSIS — Z79899 Other long term (current) drug therapy: Secondary | ICD-10-CM | POA: Insufficient documentation

## 2012-11-24 DIAGNOSIS — F0390 Unspecified dementia without behavioral disturbance: Secondary | ICD-10-CM | POA: Insufficient documentation

## 2012-11-24 DIAGNOSIS — Z9889 Other specified postprocedural states: Secondary | ICD-10-CM | POA: Insufficient documentation

## 2012-11-24 DIAGNOSIS — Y9289 Other specified places as the place of occurrence of the external cause: Secondary | ICD-10-CM | POA: Insufficient documentation

## 2012-11-24 DIAGNOSIS — Z860100 Personal history of colon polyps, unspecified: Secondary | ICD-10-CM | POA: Insufficient documentation

## 2012-11-24 DIAGNOSIS — D649 Anemia, unspecified: Secondary | ICD-10-CM | POA: Insufficient documentation

## 2012-11-24 DIAGNOSIS — M129 Arthropathy, unspecified: Secondary | ICD-10-CM | POA: Insufficient documentation

## 2012-11-24 DIAGNOSIS — W108XXA Fall (on) (from) other stairs and steps, initial encounter: Secondary | ICD-10-CM | POA: Insufficient documentation

## 2012-11-24 DIAGNOSIS — S80212A Abrasion, left knee, initial encounter: Secondary | ICD-10-CM

## 2012-11-24 DIAGNOSIS — Z8709 Personal history of other diseases of the respiratory system: Secondary | ICD-10-CM | POA: Insufficient documentation

## 2012-11-24 DIAGNOSIS — W19XXXA Unspecified fall, initial encounter: Secondary | ICD-10-CM

## 2012-11-24 DIAGNOSIS — E119 Type 2 diabetes mellitus without complications: Secondary | ICD-10-CM | POA: Insufficient documentation

## 2012-11-24 DIAGNOSIS — Z8673 Personal history of transient ischemic attack (TIA), and cerebral infarction without residual deficits: Secondary | ICD-10-CM | POA: Insufficient documentation

## 2012-11-24 DIAGNOSIS — K2270 Barrett's esophagus without dysplasia: Secondary | ICD-10-CM | POA: Insufficient documentation

## 2012-11-24 DIAGNOSIS — IMO0002 Reserved for concepts with insufficient information to code with codable children: Secondary | ICD-10-CM | POA: Insufficient documentation

## 2012-11-24 NOTE — ED Notes (Signed)
Pt ambulated from wheelchair to bed.  
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
## 2012-11-24 NOTE — ED Notes (Signed)
Fell 30 min ago, abrasions to lt knee and lower leg

## 2012-11-24 NOTE — ED Notes (Signed)
Discharge instructions reviewed with pt, questions answered. Pt verbalized understanding.  

## 2012-11-24 NOTE — ED Notes (Signed)
Pt's family concerned about pt losing weight, will address with EDP.

## 2012-11-28 ENCOUNTER — Emergency Department (HOSPITAL_COMMUNITY): Payer: Medicare Other

## 2012-11-28 ENCOUNTER — Encounter (HOSPITAL_COMMUNITY): Payer: Self-pay | Admitting: *Deleted

## 2012-11-28 ENCOUNTER — Inpatient Hospital Stay (HOSPITAL_COMMUNITY)
Admission: EM | Admit: 2012-11-28 | Discharge: 2012-12-04 | DRG: 563 | Disposition: A | Discharge status: Home / Self Care | Payer: Medicare Other | Attending: Family Medicine | Admitting: Family Medicine

## 2012-11-28 DIAGNOSIS — S52609A Unspecified fracture of lower end of unspecified ulna, initial encounter for closed fracture: Principal | ICD-10-CM | POA: Diagnosis present

## 2012-11-28 DIAGNOSIS — K2960 Other gastritis without bleeding: Secondary | ICD-10-CM | POA: Diagnosis present

## 2012-11-28 DIAGNOSIS — F17200 Nicotine dependence, unspecified, uncomplicated: Secondary | ICD-10-CM | POA: Diagnosis present

## 2012-11-28 DIAGNOSIS — Z8673 Personal history of transient ischemic attack (TIA), and cerebral infarction without residual deficits: Secondary | ICD-10-CM

## 2012-11-28 DIAGNOSIS — E119 Type 2 diabetes mellitus without complications: Secondary | ICD-10-CM | POA: Diagnosis present

## 2012-11-28 DIAGNOSIS — W19XXXA Unspecified fall, initial encounter: Secondary | ICD-10-CM

## 2012-11-28 DIAGNOSIS — N289 Disorder of kidney and ureter, unspecified: Secondary | ICD-10-CM

## 2012-11-28 DIAGNOSIS — D649 Anemia, unspecified: Secondary | ICD-10-CM

## 2012-11-28 DIAGNOSIS — IMO0002 Reserved for concepts with insufficient information to code with codable children: Secondary | ICD-10-CM | POA: Diagnosis present

## 2012-11-28 DIAGNOSIS — S52612A Displaced fracture of left ulna styloid process, initial encounter for closed fracture: Secondary | ICD-10-CM

## 2012-11-28 DIAGNOSIS — K2270 Barrett's esophagus without dysplasia: Secondary | ICD-10-CM | POA: Diagnosis present

## 2012-11-28 DIAGNOSIS — N400 Enlarged prostate without lower urinary tract symptoms: Secondary | ICD-10-CM

## 2012-11-28 DIAGNOSIS — S52502A Unspecified fracture of the lower end of left radius, initial encounter for closed fracture: Secondary | ICD-10-CM

## 2012-11-28 DIAGNOSIS — K5900 Constipation, unspecified: Secondary | ICD-10-CM | POA: Diagnosis present

## 2012-11-28 DIAGNOSIS — R4182 Altered mental status, unspecified: Secondary | ICD-10-CM

## 2012-11-28 DIAGNOSIS — K922 Gastrointestinal hemorrhage, unspecified: Secondary | ICD-10-CM | POA: Diagnosis present

## 2012-11-28 DIAGNOSIS — N179 Acute kidney failure, unspecified: Secondary | ICD-10-CM

## 2012-11-28 DIAGNOSIS — R42 Dizziness and giddiness: Secondary | ICD-10-CM

## 2012-11-28 DIAGNOSIS — W010XXA Fall on same level from slipping, tripping and stumbling without subsequent striking against object, initial encounter: Secondary | ICD-10-CM | POA: Diagnosis present

## 2012-11-28 DIAGNOSIS — S52509A Unspecified fracture of the lower end of unspecified radius, initial encounter for closed fracture: Principal | ICD-10-CM | POA: Diagnosis present

## 2012-11-28 DIAGNOSIS — Y92009 Unspecified place in unspecified non-institutional (private) residence as the place of occurrence of the external cause: Secondary | ICD-10-CM

## 2012-11-28 DIAGNOSIS — I10 Essential (primary) hypertension: Secondary | ICD-10-CM | POA: Diagnosis present

## 2012-11-28 DIAGNOSIS — D509 Iron deficiency anemia, unspecified: Secondary | ICD-10-CM | POA: Diagnosis present

## 2012-11-28 HISTORY — DX: Disorder of kidney and ureter, unspecified: N28.9

## 2012-11-28 LAB — URINALYSIS, ROUTINE W REFLEX MICROSCOPIC
Hgb urine dipstick: NEGATIVE
Ketones, ur: NEGATIVE mg/dL
Protein, ur: NEGATIVE mg/dL
Urobilinogen, UA: 0.2 mg/dL (ref 0.0–1.0)

## 2012-11-28 LAB — GLUCOSE, CAPILLARY: Glucose-Capillary: 88 mg/dL (ref 70–99)

## 2012-11-28 LAB — COMPREHENSIVE METABOLIC PANEL
AST: 12 U/L (ref 0–37)
Alkaline Phosphatase: 62 U/L (ref 39–117)
BUN: 41 mg/dL — ABNORMAL HIGH (ref 6–23)
CO2: 22 mEq/L (ref 19–32)
Chloride: 112 mEq/L (ref 96–112)
Creatinine, Ser: 1.93 mg/dL — ABNORMAL HIGH (ref 0.50–1.35)
GFR calc non Af Amer: 30 mL/min — ABNORMAL LOW (ref >90)
Potassium: 4.9 mEq/L (ref 3.5–5.1)
Total Bilirubin: 0.2 mg/dL — ABNORMAL LOW (ref 0.3–1.2)

## 2012-11-28 LAB — CBC WITH DIFFERENTIAL/PLATELET
Basophils Absolute: 0 10*3/uL (ref 0.0–0.1)
HCT: 26.6 % — ABNORMAL LOW (ref 39.0–52.0)
Hemoglobin: 8.8 g/dL — ABNORMAL LOW (ref 13.0–17.0)
Lymphocytes Relative: 21 % (ref 12–46)
Monocytes Absolute: 0.3 10*3/uL (ref 0.1–1.0)
Monocytes Relative: 9 % (ref 3–12)
Neutro Abs: 2.1 10*3/uL (ref 1.7–7.7)
RBC: 2.94 MIL/uL — ABNORMAL LOW (ref 4.22–5.81)
WBC: 3.2 10*3/uL — ABNORMAL LOW (ref 4.0–10.5)

## 2012-11-28 LAB — PROTIME-INR: Prothrombin Time: 14.5 seconds (ref 11.6–15.2)

## 2012-11-28 MED ORDER — ACETAMINOPHEN 325 MG PO TABS: 650.0000 mg | ORAL_TABLET | Freq: Four times a day (QID) | ORAL | Status: DC | PRN

## 2012-11-28 MED ORDER — SODIUM CHLORIDE 0.9 % IJ SOLN: 3.0000 mL | INTRAMUSCULAR | Status: DC | PRN

## 2012-11-28 MED ORDER — AMLODIPINE BESYLATE 5 MG PO TABS
10.0000 mg | ORAL_TABLET | Freq: Every day | ORAL | Status: DC
  Administered 2012-11-30 – 2012-12-01 (×2): 10 mg via ORAL
  Filled 2012-11-28 (×3): qty 2

## 2012-11-28 MED ORDER — ENOXAPARIN SODIUM 40 MG/0.4ML ~~LOC~~ SOLN: 40.0000 mg | SUBCUTANEOUS | Status: DC

## 2012-11-28 MED ORDER — SODIUM CHLORIDE 0.9 % IJ SOLN
3.0000 mL | Freq: Two times a day (BID) | INTRAMUSCULAR | Status: DC
  Administered 2012-11-28 – 2012-12-04 (×5): 3 mL via INTRAVENOUS

## 2012-11-28 MED ORDER — AMLODIPINE BESYLATE 5 MG PO TABS
10.0000 mg | ORAL_TABLET | Freq: Every day | ORAL | Status: DC
  Administered 2012-11-29 (×2): 10 mg via ORAL
  Filled 2012-11-28: qty 2

## 2012-11-28 MED ORDER — ENOXAPARIN SODIUM 30 MG/0.3ML ~~LOC~~ SOLN
30.0000 mg | SUBCUTANEOUS | Status: DC
  Administered 2012-11-28 – 2012-11-30 (×3): 30 mg via SUBCUTANEOUS
  Filled 2012-11-28 (×3): qty 0.3

## 2012-11-28 MED ORDER — PANTOPRAZOLE SODIUM 40 MG PO TBEC: 40.0000 mg | DELAYED_RELEASE_TABLET | Freq: Every day | ORAL | Status: DC

## 2012-11-28 MED ORDER — SODIUM CHLORIDE 0.9 % IV SOLN
INTRAVENOUS | Status: DC
  Administered 2012-11-28 – 2012-11-30 (×5): via INTRAVENOUS

## 2012-11-28 MED ORDER — CALCIUM CARBONATE-VITAMIN D 500-200 MG-UNIT PO TABS
1.0000 | ORAL_TABLET | Freq: Two times a day (BID) | ORAL | Status: DC
  Administered 2012-11-28 – 2012-12-01 (×7): 1 via ORAL
  Filled 2012-11-28 (×7): qty 1

## 2012-11-28 MED ORDER — HYDRALAZINE HCL 25 MG PO TABS: 25.0000 mg | ORAL_TABLET | Freq: Four times a day (QID) | ORAL | Status: DC | PRN

## 2012-11-28 MED ORDER — ACETAMINOPHEN 650 MG RE SUPP: 650.0000 mg | Freq: Four times a day (QID) | RECTAL | Status: DC | PRN

## 2012-11-28 MED ORDER — SODIUM CHLORIDE 0.9 % IV SOLN: 250.0000 mL | INTRAVENOUS | Status: DC | PRN

## 2012-11-28 MED ORDER — DOCUSATE SODIUM 100 MG PO CAPS
100.0000 mg | ORAL_CAPSULE | Freq: Two times a day (BID) | ORAL | Status: DC
  Administered 2012-11-28 – 2012-12-01 (×7): 100 mg via ORAL
  Filled 2012-11-28 (×7): qty 1

## 2012-11-28 MED ORDER — PANTOPRAZOLE SODIUM 40 MG PO TBEC
40.0000 mg | DELAYED_RELEASE_TABLET | Freq: Two times a day (BID) | ORAL | Status: DC
  Administered 2012-11-28 – 2012-12-01 (×7): 40 mg via ORAL
  Filled 2012-11-28 (×7): qty 1

## 2012-11-28 MED ORDER — SODIUM CHLORIDE 0.9 % IV SOLN: INTRAVENOUS | Status: AC

## 2012-11-28 MED ORDER — INSULIN ASPART 100 UNIT/ML ~~LOC~~ SOLN
0.0000 [IU] | Freq: Three times a day (TID) | SUBCUTANEOUS | Status: DC
  Administered 2012-11-29 – 2012-12-04 (×2): 1 [IU] via SUBCUTANEOUS

## 2012-11-28 MED ORDER — VITAMIN B-12 1000 MCG PO TABS
1000.0000 ug | ORAL_TABLET | Freq: Two times a day (BID) | ORAL | Status: DC
  Administered 2012-11-28 – 2012-12-01 (×7): 1000 ug via ORAL
  Filled 2012-11-28 (×7): qty 1

## 2012-11-28 MED ORDER — AMLODIPINE BESYLATE 5 MG PO TABS: 10.0000 mg | ORAL_TABLET | Freq: Every day | ORAL | Status: DC

## 2012-11-28 NOTE — ED Notes (Signed)
Family member states pt "layed his head down 48 times this morning" Was seen by Dr. Sudie Bailey. States he had a fall on the 2nd. States " It seems like he keeps blacking out. His eyes are open but no one is home." Bruising to left had form the fall.
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
## 2012-11-28 NOTE — ED Notes (Signed)
Pt presents to ED with c/o dehydration, family states is confused
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
## 2012-11-29 LAB — COMPREHENSIVE METABOLIC PANEL
ALT: 5 U/L (ref 0–53)
AST: 9 U/L (ref 0–37)
Albumin: 2.6 g/dL — ABNORMAL LOW (ref 3.5–5.2)
Alkaline Phosphatase: 54 U/L (ref 39–117)
BUN: 34 mg/dL — ABNORMAL HIGH (ref 6–23)
Chloride: 111 mEq/L (ref 96–112)
Potassium: 4.1 mEq/L (ref 3.5–5.1)
Total Bilirubin: 0.2 mg/dL — ABNORMAL LOW (ref 0.3–1.2)

## 2012-11-29 LAB — CBC
HCT: 24.7 % — ABNORMAL LOW (ref 39.0–52.0)
Platelets: 114 10*3/uL — ABNORMAL LOW (ref 150–400)
RDW: 13.7 % (ref 11.5–15.5)
WBC: 2.8 10*3/uL — ABNORMAL LOW (ref 4.0–10.5)

## 2012-11-29 LAB — IRON AND TIBC
Saturation Ratios: 16 % — ABNORMAL LOW (ref 20–55)
TIBC: 169 ug/dL — ABNORMAL LOW (ref 215–435)

## 2012-11-29 LAB — GLUCOSE, CAPILLARY: Glucose-Capillary: 82 mg/dL (ref 70–99)

## 2012-11-29 LAB — FOLATE: Folate: 16.4 ng/mL

## 2012-11-29 LAB — VITAMIN B12: Vitamin B-12: >2000 pg/mL — ABNORMAL HIGH (ref 211–911)

## 2012-11-29 MED ORDER — BISACODYL 5 MG PO TBEC
5.0000 mg | DELAYED_RELEASE_TABLET | Freq: Every day | ORAL | Status: DC | PRN
  Administered 2012-11-29: 5 mg via ORAL
  Filled 2012-11-29: qty 1

## 2012-11-29 NOTE — Progress Notes (Signed)
NAMESAMARTH, OGLE               ACCOUNT NO.:  [id number]  MEDICAL RECORD NO.:  [id number]  LOCATION:  A327                          FACILITY:  [facility]  PHYSICIAN:  Philomene Haff G. Renard Matter, MD   DATE OF BIRTH:  09-18-1926  DATE OF PROCEDURE:  11/29/2012 DATE OF DISCHARGE:                                PROGRESS NOTE   SUBJECTIVE:  This patient was admitted after the complaint of dizziness. Apparently 4 days prior to coming to the hospital.  He tripped on the steps and fell, sustaining a nondisplaced left radial fracture as well as ulnar styloid fracture.  He apparently had not been drinking fluids and had been losing some weight and had a history of anemia.  Previous GI workup had been negative.  The patient is alert and oriented.  OBJECTIVE:  VITAL SIGNS:  Blood pressure 132/55, respiration 20, pulse77, temp 98.2. HEENT:  Negative. NECK:  Supple.  No JVD or thyroid abnormalities.  No abnormalities. HEART:  Regular rhythm.  No murmurs. LUNGS:  Clear to P and A. ABDOMEN:  No palpable organs or masses.  The patient has a splint on the left lower arm.  He does have a low blood count with hemoglobin of 8.1, hematocrit 24.7. Slightly elevated creatinine of 1.53.  ASSESSMENT:  The patient has a close fracture on the left ulna styloid and left distal radius, diabetes mellitus, hypertension, anemia of undetermined cause.  PLAN:  We will obtain orthopedic consult regarding his left arm.  We will monitor creatinine, give IV fluids.  The patient is started on Norvasc for treatment of hypertension.  Stool for occult blood is being obtained.  If positive, we will obtain GI consult.  Continue to monitor the patient's hemoglobin and hematocrit.     Mysti Haley G. Renard Matter, MD     AGM/MEDQ  D:  11/29/2012  T:  11/29/2012  Job:  [id number]

## 2012-11-29 NOTE — Progress Notes (Signed)
PT Cancellation Note  Patient Details Name: [name] MRN: [id number] DOB: 1927/03/29   Cancelled Treatment:    Reason Eval/Treat Not Completed: Fatigue/lethargy limiting ability to participate An attempt was made to see pt for a PT evaluation.  He declined to participate, stating that he was up most of the night and was too tired to work with me.  Unless I am here tomorrow to see a new Orthopedic or Neuro consult, I will see him on Monday.  Myrlene Broker L 11/29/2012, 9:55 AM

## 2012-11-30 DIAGNOSIS — S52609A Unspecified fracture of lower end of unspecified ulna, initial encounter for closed fracture: Principal | ICD-10-CM

## 2012-11-30 LAB — CBC WITH DIFFERENTIAL/PLATELET
Basophils Absolute: 0 10*3/uL (ref 0.0–0.1)
Basophils Relative: 1 % (ref 0–1)
Eosinophils Absolute: 0.1 10*3/uL (ref 0.0–0.7)
Eosinophils Relative: 4 % (ref 0–5)
HCT: 26.8 % — ABNORMAL LOW (ref 39.0–52.0)
MCH: 29.6 pg (ref 26.0–34.0)
MCHC: 32.8 g/dL (ref 30.0–36.0)
MCV: 90.2 fL (ref 78.0–100.0)
Monocytes Absolute: 0.3 10*3/uL (ref 0.1–1.0)
RDW: 13.6 % (ref 11.5–15.5)

## 2012-11-30 LAB — GLUCOSE, CAPILLARY
Glucose-Capillary: 95 mg/dL (ref 70–99)
Glucose-Capillary: 118 mg/dL — ABNORMAL HIGH (ref 70–99)

## 2012-11-30 LAB — OCCULT BLOOD X 1 CARD TO LAB, STOOL: Fecal Occult Bld: POSITIVE — AB
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
## 2012-11-30 NOTE — Progress Notes (Signed)
  Assessment/Plan: Left wrist fracture non displaced Splinted  Splint x 6 weeks  xrays in 6 weeks    Fuller Canada 11/30/2012, 11:04 AM

## 2012-11-30 NOTE — Progress Notes (Signed)
Notified Dr. Felecia Shelling that pt has lost IV access, and multiple attempts have been made at restarting an IV, 2 by myself. Order received for PICC line to be done as soon as possible, because pt is receiving IVF at 153ml/hr. No one is able to do this tonight d/t the fact that it is the weekend. Sheryn Bison

## 2012-11-30 NOTE — Progress Notes (Signed)
[name], [name]               ACCOUNT NO.:  [id number]  MEDICAL RECORD NO.:  [id number]  LOCATION:  A327                          FACILITY:  [facility]  PHYSICIAN:  Cardell Rachel G. Renard Matter, MD   DATE OF BIRTH:  [birth year]  DATE OF PROCEDURE: DATE OF DISCHARGE:                                PROGRESS NOTE   This patient feels relatively comfortable.  He does have a nondisplaced left radial fracture as well as ulnar fracture, left arm.  Does have a history of anemia, but his hemoglobin now is 8.8.  A previous GI workup had been negative.  He remains alert and oriented.  His hemoglobin today is 8.8 with hematocrit 26.8.  OBJECTIVE:  VITAL SIGNS:  Blood pressure 142/40, respirations 20, pulse 87, temp 97.7.  HEENT:  Negative. NECK:  Supple.  No JVD or thyroid abnormalities. HEART:  Regular rhythm.  No murmurs. LUNGS:  Clear to P and A. ABDOMEN:  No palpable organs or masses.  The patient has a splint on the left arm.  ASSESSMENT:  The patient has a closed fracture of the left ulna styloid left distal radius, diabetes mellitus, hypertension, and anemia of undetermined cause.  PLAN:  We will obtain Orthopedic consult regarding the left arm, monitor creatinine, give IV fluids, monitor hypertension and stool for occult blood.  Monitor hemoglobin and hematocrit.     [name] G. Renard Matter, MD     AGM/MEDQ  D:  11/30/2012  T:  11/30/2012  Job:  [id number]

## 2012-12-01 ENCOUNTER — Encounter (HOSPITAL_COMMUNITY): Payer: Self-pay | Admitting: Gastroenterology

## 2012-12-01 DIAGNOSIS — D649 Anemia, unspecified: Secondary | ICD-10-CM

## 2012-12-01 DIAGNOSIS — R195 Other fecal abnormalities: Secondary | ICD-10-CM

## 2012-12-01 LAB — CBC WITH DIFFERENTIAL/PLATELET
Basophils Absolute: 0 10*3/uL (ref 0.0–0.1)
HCT: 24.7 % — ABNORMAL LOW (ref 39.0–52.0)
Hemoglobin: 8.2 g/dL — ABNORMAL LOW (ref 13.0–17.0)
Lymphocytes Relative: 30 % (ref 12–46)
Monocytes Absolute: 0.3 10*3/uL (ref 0.1–1.0)
Monocytes Relative: 10 % (ref 3–12)
Neutro Abs: 1.8 10*3/uL (ref 1.7–7.7)
RBC: 2.74 MIL/uL — ABNORMAL LOW (ref 4.22–5.81)
WBC: 3.3 10*3/uL — ABNORMAL LOW (ref 4.0–10.5)

## 2012-12-01 LAB — GLUCOSE, CAPILLARY: Glucose-Capillary: 102 mg/dL — ABNORMAL HIGH (ref 70–99)

## 2012-12-01 LAB — BASIC METABOLIC PANEL
Chloride: 111 mEq/L (ref 96–112)
GFR calc Af Amer: 48 mL/min — ABNORMAL LOW (ref >90)
GFR calc non Af Amer: 42 mL/min — ABNORMAL LOW (ref >90)
Potassium: 4.3 mEq/L (ref 3.5–5.1)

## 2012-12-01 MED ORDER — ENOXAPARIN SODIUM 30 MG/0.3ML ~~LOC~~ SOLN: 30.0000 mg | SUBCUTANEOUS | Status: DC

## 2012-12-01 MED ORDER — POLYETHYLENE GLYCOL 3350 17 G PO PACK
17.0000 g | PACK | Freq: Every day | ORAL | Status: DC
  Administered 2012-12-01: 17 g via ORAL
  Filled 2012-12-01: qty 1

## 2012-12-01 MED ORDER — SODIUM CHLORIDE 0.9 % IJ SOLN
10.0000 mL | INTRAMUSCULAR | Status: DC | PRN
  Administered 2012-12-01: 10 mL via INTRAVENOUS
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
## 2012-12-01 NOTE — Progress Notes (Signed)
UR chart review completed.  

## 2012-12-01 NOTE — Care Management Note (Signed)
    Page 1 of 1   12/04/2012     11:29:14 AM   CARE MANAGEMENT NOTE 12/04/2012  Patient:  [name], [name]   Account Number:  [id number]  Date Initiated:  12/01/2012  Documentation initiated by:  Sharrie Rothman  Subjective/Objective Assessment:   PT admitted from home s/p fall with wrist fracture and anemia. Pt lives with his son and will return home at discharge. Pt has a cane and walker for home use and is moderately independent with ADL's.     Action/Plan:   Will follow for [facility] needs.   Anticipated DC Date:  12/04/2012   Anticipated DC Plan:  HOME/SELF CARE      DC Planning Services  CM consult      Choice offered to / List presented to:             Status of service:  Completed, signed off Medicare Important Message given?  YES (If response is "NO", the following Medicare IM given date fields will be blank) Date Medicare IM given:  12/04/2012 Date Additional Medicare IM given:    Discharge Disposition:  HOME/SELF CARE  Per UR Regulation:    If discussed at Long Length of Stay Meetings, dates discussed:   12/04/2012    Comments:  12/01/12 1600 Arlyss Queen, RN BSN CM 12/03/12 Mithra Spano RN BSN CM Per above CM, talked with Dr. Charma Igo. Awaiting results of Capsule swallow to DC today. 12/04/12 Tianah Lonardo Leanord Hawking RN BSN CM Per Morrie Sheldon RN, Dr. Renard Matter is awaiting results of pt's capsule study to officially DC pt. Results should be available this afternoon. Dr Renard Matter told RN that DC would be later today

## 2012-12-01 NOTE — Evaluation (Signed)
Physical Therapy Evaluation Patient Details Name: [name] MRN: [id number] DOB: 03-23-1927 Today's Date: 12/01/2012 Time: 1135-1204 PT Time Calculation (min): 29 min  PT Assessment / Plan / Recommendation Clinical Impression  Pt was seen for evaluation and found to have independence with transfers and stable gait using a cane in the right hand.  He lives with his son who is at home 24/7 and usually uses  a walker for gait.  He recently fractured his left wrist and is in an immobilizer...he will be non weight bearing on this wrist for the indefinite future.  He was able to substitute and cane for the walker and had surprisingly good stability with the cane, even when challenged.  This is a much better option than having to use a left platform on the walker as they can be awkward to use.  He seems to be at functional baseline.    PT Assessment  Patent does not need any further PT services    Follow Up Recommendations  No PT follow up    Does the patient have the potential to tolerate intense rehabilitation      Barriers to Discharge        Equipment Recommendations  None recommended by PT    Recommendations for Other Services     Frequency      Precautions / Restrictions Precautions Precautions: Fall Required Braces or Orthoses: Other Brace/Splint Other Brace/Splint: left wrist immobilizer Restrictions Other Position/Activity Restrictions: no weight bearing on left wrist   Pertinent Vitals/Pain       Mobility  Bed Mobility Bed Mobility: Supine to Sit;Sit to Supine Supine to Sit: 7: Independent;HOB elevated Sit to Supine: 7: Independent;HOB elevated Transfers Transfers: Sit to Stand;Stand to Sit Sit to Stand: 6: Modified independent (Device/Increase time);From bed;With upper extremity assist Stand to Sit: 6: Modified independent (Device/Increase time);To chair/3-in-1;With upper extremity assist Ambulation/Gait Ambulation/Gait Assistance: 6: Modified independent  (Device/Increase time) Ambulation Distance (Feet): 300 Feet Assistive device: Straight cane Gait Pattern: Within Functional Limits Gait velocity: WNL General Gait Details: no gait instability when challenged Stairs: No Wheelchair Mobility Wheelchair Mobility: No    Exercises     PT Diagnosis:    PT Problem List:   PT Treatment Interventions:     PT Goals    Visit Information  Last PT Received On: 12/01/12    Subjective Data  Subjective: If I could get my bowels working, I would be alright Patient Stated Goal: to have a good bowel movement on a regular basis   Prior Functioning  Home Living Lives With: Son Available Help at Discharge: Family;Available 24 hours/day Type of Home: House Home Access: Ramped entrance Home Layout: One level Bathroom Shower/Tub: Health visitor: Standard Home Adaptive Equipment: Environmental consultant - rolling;Straight cane Additional Comments: uses a walker most of the time Prior Function Level of Independence: Independent with assistive device(s) Able to Take Stairs?: No Driving: No Vocation: Retired Musician: Surveyor, mining Arousal/Alertness: Psychologist, occupational During Therapy: WFL for tasks assessed/performed Overall Cognitive Status: Within Functional Limits for tasks assessed    Extremity/Trunk Assessment Right Lower Extremity Assessment RLE ROM/Strength/Tone: Within functional levels RLE Sensation: WFL - Light Touch RLE Coordination: WFL - gross motor Left Lower Extremity Assessment LLE ROM/Strength/Tone: Within functional levels LLE Sensation: WFL - Light Touch LLE Coordination: WFL - gross motor Trunk Assessment Trunk Assessment: Normal   Balance High Level Balance High Level Balance Activites: Backward walking;Direction changes;Turns;Head turns;Sudden stops High Level Balance Comments: no  LOB with the above using a cane  End of Session PT - End of Session Equipment Utilized During  Treatment: Gait belt Activity Tolerance: Patient tolerated treatment well Patient left: in chair;with call bell/phone within reach  GP     Myrlene Broker L 12/01/2012, 12:14 PM

## 2012-12-01 NOTE — Progress Notes (Signed)
[name], [name]               ACCOUNT NO.:  [id number]  MEDICAL RECORD NO.:  [id number]  LOCATION:  A327                          FACILITY:  [facility]  PHYSICIAN:  Shalea Tomczak G. Renard Matter, MD   DATE OF BIRTH:  1926/07/21  DATE OF PROCEDURE: DATE OF DISCHARGE:                                PROGRESS NOTE   SUBJECTIVE:  This patient feels relatively comfortable.  He has a nondisplaced radial fracture and ulnar fracture of the left arm.  He has been seen by orthopedist, Dr. Romeo Apple who feels that he needs to have his splint left on for 6 weeks and report back to him for further evaluation of the fracture, but he does have an anemia with most recent hemoglobin of 8.8.  He did have a heme-positive stool.  His vital signs remained stable.  He had been relatively asymptomatic.  OBJECTIVE:  VITAL SIGNS:  Blood pressure 150/70, respirations 20, pulse 63, temp 97.98. HEENT:  Negative. NECK:  Supple.  No JVD or thyroid abnormalities. HEART:  Regular rhythm.  No murmurs. LUNGS:  Clear to P and A. ABDOMEN:  No palpable organs or masses.  The patient does have a splint on his left arm.  ASSESSMENT:  The patient has closed fracture of the left ulna styloid and left distal radius.  He does have diabetes, which remains in fair control.  He does have a low hemoglobin and stool positive for occult blood.  PLAN:  To obtain GI consult.  Repeat CBC.     Adrean Findlay G. Renard Matter, MD     AGM/MEDQ  D:  12/01/2012  T:  12/01/2012  Job:  [id number]

## 2012-12-02 ENCOUNTER — Encounter (HOSPITAL_COMMUNITY): Payer: Self-pay | Admitting: *Deleted

## 2012-12-02 ENCOUNTER — Encounter (HOSPITAL_COMMUNITY)
Admission: EM | Disposition: A | Discharge status: Home / Self Care | Payer: Self-pay | Source: Home / Self Care | Attending: Family Medicine

## 2012-12-02 DIAGNOSIS — K2970 Gastritis, unspecified, without bleeding: Secondary | ICD-10-CM

## 2012-12-02 DIAGNOSIS — D649 Anemia, unspecified: Secondary | ICD-10-CM

## 2012-12-02 DIAGNOSIS — K2270 Barrett's esophagus without dysplasia: Secondary | ICD-10-CM

## 2012-12-02 DIAGNOSIS — K2990 Gastroduodenitis, unspecified, without bleeding: Secondary | ICD-10-CM

## 2012-12-02 HISTORY — PX: ESOPHAGOGASTRODUODENOSCOPY: SHX5428

## 2012-12-02 LAB — GLUCOSE, CAPILLARY
Glucose-Capillary: 94 mg/dL (ref 70–99)
Glucose-Capillary: 101 mg/dL — ABNORMAL HIGH (ref 70–99)
Glucose-Capillary: 77 mg/dL (ref 70–99)
Glucose-Capillary: 166 mg/dL — ABNORMAL HIGH (ref 70–99)

## 2012-12-02 SURGERY — EGD (ESOPHAGOGASTRODUODENOSCOPY)
Anesthesia: Moderate Sedation

## 2012-12-02 MED ORDER — MINERAL OIL PO OIL
TOPICAL_OIL | ORAL | Status: AC
  Filled 2012-12-02: qty 30

## 2012-12-02 MED ORDER — MIDAZOLAM HCL 5 MG/5ML IJ SOLN
INTRAMUSCULAR | Status: AC
  Filled 2012-12-02: qty 10

## 2012-12-02 MED ORDER — POLYETHYLENE GLYCOL 3350 17 G PO PACK
17.0000 g | PACK | Freq: Three times a day (TID) | ORAL | Status: DC
  Administered 2012-12-03 – 2012-12-04 (×2): 17 g via ORAL
  Filled 2012-12-02 (×3): qty 1

## 2012-12-02 MED ORDER — MEPERIDINE HCL 100 MG/ML IJ SOLN
INTRAMUSCULAR | Status: AC
  Filled 2012-12-02: qty 2

## 2012-12-02 MED ORDER — SODIUM CHLORIDE 0.9 % IV SOLN: INTRAVENOUS | Status: DC

## 2012-12-02 MED ORDER — PANTOPRAZOLE SODIUM 40 MG PO TBEC
40.0000 mg | DELAYED_RELEASE_TABLET | Freq: Every day | ORAL | Status: DC
  Administered 2012-12-03 – 2012-12-04 (×2): 40 mg via ORAL
  Filled 2012-12-02 (×3): qty 1

## 2012-12-02 MED ORDER — STERILE WATER FOR IRRIGATION IR SOLN
Status: DC | PRN
  Administered 2012-12-02: 15:00:00

## 2012-12-02 MED ORDER — SODIUM CHLORIDE 0.9 % IV SOLN
INTRAVENOUS | Status: DC
  Administered 2012-12-02: 13:00:00 via INTRAVENOUS

## 2012-12-02 MED ORDER — VITAMIN B-12 1000 MCG PO TABS
1000.0000 ug | ORAL_TABLET | Freq: Two times a day (BID) | ORAL | Status: DC
  Administered 2012-12-02 – 2012-12-04 (×5): 1000 ug via ORAL
  Filled 2012-12-02 (×5): qty 1

## 2012-12-02 MED ORDER — PANTOPRAZOLE SODIUM 40 MG PO TBEC
40.0000 mg | DELAYED_RELEASE_TABLET | Freq: Two times a day (BID) | ORAL | Status: DC
  Administered 2012-12-02: 40 mg via ORAL

## 2012-12-02 MED ORDER — BUTAMBEN-TETRACAINE-BENZOCAINE 2-2-14 % EX AERO
INHALATION_SPRAY | CUTANEOUS | Status: DC | PRN
  Administered 2012-12-02: 2 via TOPICAL

## 2012-12-02 MED ORDER — CALCIUM CARBONATE-VITAMIN D 500-200 MG-UNIT PO TABS
1.0000 | ORAL_TABLET | Freq: Two times a day (BID) | ORAL | Status: DC
  Administered 2012-12-02 – 2012-12-04 (×5): 1 via ORAL
  Filled 2012-12-02 (×5): qty 1

## 2012-12-02 MED ORDER — AMLODIPINE BESYLATE 5 MG PO TABS
10.0000 mg | ORAL_TABLET | Freq: Every day | ORAL | Status: DC
  Administered 2012-12-02 – 2012-12-04 (×3): 10 mg via ORAL
  Filled 2012-12-02 (×3): qty 2

## 2012-12-02 MED ORDER — DOCUSATE SODIUM 100 MG PO CAPS
100.0000 mg | ORAL_CAPSULE | Freq: Two times a day (BID) | ORAL | Status: DC
  Administered 2012-12-02 – 2012-12-04 (×5): 100 mg via ORAL
  Filled 2012-12-02 (×5): qty 1

## 2012-12-02 MED ORDER — MIDAZOLAM HCL 5 MG/5ML IJ SOLN
INTRAMUSCULAR | Status: DC | PRN
  Administered 2012-12-02 (×2): 1 mg via INTRAVENOUS

## 2012-12-02 MED ORDER — POLYETHYLENE GLYCOL 3350 17 G PO PACK
17.0000 g | PACK | Freq: Once | ORAL | Status: DC
  Administered 2012-12-02: 17 g via ORAL

## 2012-12-02 MED ORDER — MEPERIDINE HCL 100 MG/ML IJ SOLN
INTRAMUSCULAR | Status: DC | PRN
  Administered 2012-12-02 (×2): 25 mg via INTRAVENOUS
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
## 2012-12-02 NOTE — Op Note (Signed)
[facility] [address] [place], [address]   ENDOSCOPY PROCEDURE REPORT  PATIENT: [name], [name]  MR#: [id number] BIRTHDATE: [birth year] , 86  yrs. old GENDER: Male  ENDOSCOPIST: Jonette Eva, MD REFERRED XB:JYNWG Sudie Bailey, M.D.  PROCEDURE DATE: 12/02/2012 PROCEDURE:   EGD, diagnostic  INDICATIONS:CHRONIC NORMOCYTIC ANEMIA-FERRITIN 293, CR 1.48.  PRIOR GI W/U 2006: EGD/TCS-COLON AVMs ABLATED, 2012: TCS/EGD-PUD-NL DUODENAL BX AND GASTRIC Bx: NO H PYLORI. MEDICATIONS: Demerol 50 mg IV and Versed 2 mg IV TOPICAL ANESTHETIC:   Cetacaine Spray  DESCRIPTION OF PROCEDURE:     Physical exam was performed.  Informed consent was obtained from the patient after explaining the benefits, risks, and alternatives to the procedure.  The patient was connected to the monitor and placed in the left lateral position.  Continuous oxygen was provided by nasal cannula and IV medicine administered through an indwelling cannula.  After administration of sedation, the patients esophagus was intubated and the EG-[id number] ([id number])  endoscope was advanced under direct visualization to the second portion of the duodenum.  The scope was removed slowly by carefully examining the color, texture, anatomy, and integrity of the mucosa on the way out.  The patient was recovered in endoscopy and discharged home in satisfactory condition.   ESOPHAGUS: SHORT SEGMENT BARRETT'S.  OTHERWISE NORMAL ESOPHAGUS. STOMACH: Mild non-erosive gastritis (inflammation) was found in the gastric antrum.   DUODENUM: The duodenal mucosa showed no abnormalities in the bulb and second portion of the duodenum. COMPLICATIONS:   None  ENDOSCOPIC IMPRESSION: 1.   SHORT SEGMENT BARRETT'S. 2.   Non-erosive gastritis  RECOMMENDATIONS: FULL LIQUID DIET DAILY PPI GIVENS CAPSULE JUN 11 FOR OBSCURE GI BLEED/HEME POS TOOLS. REPET EGD FOR BARRETT'S SURVEILLANCE IN 2015 IF THE BENEFITS OUTWEIGH THE RISKS.   REPEAT  EXAM:   _______________________________ Rosalie DoctorJonette Eva, MD 12/02/2012 3:55 PM

## 2012-12-02 NOTE — Progress Notes (Signed)
[name], [name]               ACCOUNT NO.:  [id number]  MEDICAL RECORD NO.:  [id number]  LOCATION:  A327                          FACILITY:  [facility]  PHYSICIAN:  Colie Josten G. Renard Matter, MD   DATE OF BIRTH:  10-13-1926  DATE OF PROCEDURE:  12/02/2012 DATE OF DISCHARGE:                                PROGRESS NOTE   SUBJECTIVE:  This patient feels relatively comfortable this morning.  He has a nondisplaced radial fracture and ulnar fracture of the left arm. He has been seen by orthopedist, Dr. Romeo Apple and feels that he needs to have splint left on for 6 weeks and report back to him for further evaluation of the fracture.  He does have anemia with most recent hemoglobin of 8.8 and heme-positive stool.  He has been seen by gastroenterology service and EGD is being considered.  OBJECTIVE:  VITAL SIGNS:  Blood pressure 92/60, respiration 20, pulse 79, temperature 97.4. HEENT:  Negative. NECK:  Supple.  No JVD or thyroid abnormalities. HEART:  Regular rhythm.  No murmurs. LUNGS:  Clear to P and A. ABDOMEN:  No palpable organs or masses. EXTREMITIES:  The patient does have a splint on his left arm.  The patient's current hemoglobin 8.2 and hematocrit 24.7.  ASSESSMENT/PLAN:  The patient does have a closed fractured of left ulna, styloid, and left distal radius.  He does have diabetes which remains in fair control.  He does have low hemoglobin and heme-positive stool.  GI service to proceed with the EGD today and further evaluation.     Giovannie Scerbo G. Renard Matter, MD     AGM/MEDQ  D:  12/02/2012  T:  12/02/2012  Job:  [id number]

## 2012-12-03 ENCOUNTER — Encounter (HOSPITAL_COMMUNITY)
Admission: EM | Disposition: A | Discharge status: Home / Self Care | Payer: Self-pay | Source: Home / Self Care | Attending: Family Medicine

## 2012-12-03 HISTORY — PX: GIVENS CAPSULE STUDY: SHX5432

## 2012-12-03 SURGERY — IMAGING PROCEDURE, GI TRACT, INTRALUMINAL, VIA CAPSULE

## 2012-12-03 NOTE — Progress Notes (Signed)
[name], [name]               ACCOUNT NO.:  [id number]  MEDICAL RECORD NO.:  [id number]  LOCATION:  A327                          FACILITY:  [facility]  PHYSICIAN:  Gigi Onstad G. Renard Matter, MD   DATE OF BIRTH:  Dec 11, 1926  DATE OF PROCEDURE: DATE OF DISCHARGE:                                PROGRESS NOTE   SUBJECTIVE:  This patient feels relatively comfortable this morning.  He does have a nondisplaced radial fracture and ulnar fracture left arm and has been seen by orthopedist who feels he needs to have splint for 6 weeks.  He does have anemia and had recent heme-positive stools.  Did have the EGD done by [facility] and is having Givens capsule study now.  He feels comfortable this morning with no further episodes of bleeding.  OBJECTIVE:  VITAL SIGNS:  Blood pressure 146/73, respirations 17, pulse 64, temp 98.2. HEENT:  Negative. NECK:  Supple.  No JVD or thyroid abnormalities. HEART:  Regular rhythm.  No murmurs. LUNGS:  Clear to P and A. ABDOMEN:  No palpable organs or masses. EXTREMITIES:  The patient does have a splint on his left arm.  ASSESSMENT:  The patient does have closed fracture of left ulna, styloid, and left distal radius.  He does have diabetes, which is in fair control.  He does have low hemoglobins, recent heme-positive stool. A Givens study is being done by [facility] to help determine source of bleeding.  He does have chronic anemia.     Adonna Horsley G. Renard Matter, MD     AGM/MEDQ  D:  12/03/2012  T:  12/03/2012  Job:  [id number]

## 2012-12-03 NOTE — Plan of Care (Signed)
Problem: Phase III Progression Outcomes Goal: Tol increased activity, up in chair for at least 4 hrs/HD pt Outcome: Progressing 12/03/12 1233 Patient assisted up to chair, tolerated well. Chair alarm on for safety, call light within reach. Instructed to call for assistance and not attempt getting up on his own. States will call. Earnstine Regal, RN

## 2012-12-03 NOTE — Progress Notes (Signed)
Subjective: Denies abdominal pain, N/V, hematochezia, melena. No complaints.   Objective: Vital signs in last 24 hours: Temp:  [97.8 F (36.6 C)-98.4 F (36.9 C)] 98.2 F (36.8 C) (06/11 0500) Pulse Rate:  [62-76] 64 (06/11 0500) Resp:  [11-21] 17 (06/11 0500) BP: (130-207)/(58-101) 146/73 mmHg (06/11 0500) SpO2:  [94 %-100 %] 96 % (06/11 0500) Last BM Date: 11/27/12 General:   Alert and oriented, pleasant Head:  Normocephalic and atraumatic Heart:  S1, S2 present, no murmurs noted.  Lungs: Clear to auscultation bilaterally, without wheezing, rales, or rhonchi.  Abdomen:  Bowel sounds present, soft, non-tender, non-distended. No HSM or hernias noted. No rebound or guarding. No masses appreciated  Psych:  Alert and cooperative. Normal mood and affect.  Intake/Output from previous day: 06/10 0701 - 06/11 0700 In: 240 [P.O.:240] Out: 1050 [Urine:1050] Intake/Output this shift:    Lab Results:  Recent Labs  12/01/12 0530  WBC 3.3*  HGB 8.2*  HCT 24.7*  PLT 121*   BMET  Recent Labs  12/01/12 0531  NA 141  K 4.3  CL 111  CO2 23  GLUCOSE 86  BUN 24*  CREATININE 1.48*  CALCIUM 9.0    Assessment: 79 year old male with chronic anemia, heme positive stools, s/p EGD this admission noting short segment Barrett's, mild non-erosive gastritis, no duodenal abnormalities. No overt signs of GI bleeding. Last colonoscopy in Oct 2012 with tubular adenomas and internal hemorrhoids. Scheduled for capsule study today for further evaluation of possible obscure GI bleeding. Likely anemia is multifactorial with a component of chronic disease.  As separate issue, chronic constipation noted, with last BM reported as 6/5. Will need to verify this with staff; may need Miralax BID or other agent such as Linzess or Amitiza.   Plan: Capsule study today PPI BID Barrett's surveillance in 2015 if benefits outweigh the risks.  Miralax for constipation  Nira Retort, ANP-BC [facility]  Gastroenterology      LOS: 5 days    12/03/2012, 7:50 AM  Attending note: Patient just had a bowel movement with MiraLax.  Capsule results should be available by tomorrow morning.

## 2012-12-03 NOTE — Progress Notes (Signed)
12/03/12 1414 Received call from Luis Halls, NP this morning regarding patient having stool after miralax as ordered. Stated pt was currently NPO but would receive miralax as scheduled after givens capsule study. Stated okay. Patient states had bowel movement this afternoon, notified Dr. Jena [name] on rounds this afternoon. Earnstine Regal, RN

## 2012-12-04 ENCOUNTER — Encounter (HOSPITAL_COMMUNITY): Payer: Self-pay | Admitting: Gastroenterology

## 2012-12-04 DIAGNOSIS — D649 Anemia, unspecified: Secondary | ICD-10-CM

## 2012-12-04 DIAGNOSIS — K633 Ulcer of intestine: Secondary | ICD-10-CM

## 2012-12-04 DIAGNOSIS — R195 Other fecal abnormalities: Secondary | ICD-10-CM

## 2012-12-04 LAB — GLUCOSE, CAPILLARY
Glucose-Capillary: 91 mg/dL (ref 70–99)
Glucose-Capillary: 128 mg/dL — ABNORMAL HIGH (ref 70–99)

## 2012-12-04 MED ORDER — DSS 100 MG PO CAPS: 100.0000 mg | ORAL_CAPSULE | Freq: Two times a day (BID) | ORAL | Status: DC

## 2012-12-04 MED ORDER — AMLODIPINE BESYLATE 10 MG PO TABS: 10.0000 mg | ORAL_TABLET | Freq: Every day | ORAL | Status: DC

## 2012-12-04 NOTE — Progress Notes (Signed)
12/04/12 1104 Patient tolerating full liquids well, requested diet increased. Discussed with Dr. Renard Matter, order received to increase diet to carb modified. Stated he would like to have results from Givens Capsule study back from GI and their okay for patient discharge.  Will discharge patient once results received and okay. Earnstine Regal, RN

## 2012-12-04 NOTE — Progress Notes (Signed)
Subjective: Didn't sleep well last night. Otherwise, no nausea, vomiting, abdominal pain, signs of melena or rectal bleeding.   Objective: Vital signs in last 24 hours: Temp:  [98 F (36.7 C)-98.3 F (36.8 C)] 98.2 F (36.8 C) (06/12 0525) Pulse Rate:  [62-79] 72 (06/12 0528) Resp:  [18-20] 18 (06/12 0525) BP: (88-156)/(53-76) 112/62 mmHg (06/12 0528) SpO2:  [96 %] 96 % (06/12 0525) Last BM Date: 12/03/12 General:   Alert and oriented, pleasant Heart:  S1, S2 present, no murmurs noted.  Lungs: mild expiratory wheeze bilaterally Abdomen:  Bowel sounds present, soft, non-tender, non-distended. No HSM or hernias noted. No rebound or guarding. No masses appreciated  Neurologic:  Alert and  oriented x4;  grossly normal neurologically.   Intake/Output from previous day: 06/11 0701 - 06/12 0700 In: 3 [I.V.:3] Out: 1000 [Urine:1000] Intake/Output this shift:     Assessment: 79 year old male with chronic anemia, heme positive stools, s/p EGD this admission noting short segment Barrett's, mild non-erosive gastritis, no duodenal abnormalities. No overt signs of GI bleeding. Last colonoscopy in Oct 2012 with tubular adenomas and internal hemorrhoids. Capsule study performed yesterday, to be read today. Patient remains without overt evidence of GI bleeding. Operative note from capsule study will be available later today. Recommend outpatient follow-up with [place] in 4-6 weeks. We will arrange.     Plan: PPI BID Barrett's surveillance in 2015 if benefits outweigh the risk Miralax for constipation Outpatient follow-up in 4-6 weeks Follow peripherally  Nira Retort, ANP-BC [facility] Gastroenterology    LOS: 6 days    12/04/2012, 7:58 AM

## 2012-12-04 NOTE — Procedures (Signed)
Small Bowel Givens Capsule Study Procedure date:  12/03/12  Referring Provider:  Dr. Darrick Penna  PCP:  Dr. Milana Obey, MD  Indication for procedure:   Mr. Noach is an 79 year old male with chronic normocytic anemia, heme positive stools, with need for capsule endoscopy due to possible obscure GI bleeding. Colonoscopy fairly up-to-date as of 2012, noting tubular adenomas and internal hemorrhoids. Upper endoscopy this admission with short segment Barrett's esophagus and mild gastritis. No evidence of overt GI bleeding during hospitalization. Capsule endoscopy performed to further evaluate the small bowel as possible cause for obscure GI bleed.     Findings:   Capsule did not, convincingly, make it to the cecum on images available we have to review.  Question underlying delayed gastric emptying, as capsule remained in the stomach for an hour and 24 minutes. Several small, non-bleeding superficial erosions were noted in the small bowel. Multiple areas of yellow, plaque-like areas were noted starting at 2:24:17 and continuing throughout the entirety of the small bowel. Several questionable polypoid-like protrusions noted beginning at 2:25:10, 4:11:38, 4:35:44, and 4:37:22. Question lymphangiectasias as culprit for multiple areas of discoloration. Area of probable ulceration at 2 hours and 40 minutes 48 seconds  First Gastric image:  10:56 First Duodenal image: 1:35:43 First Cecal image: 6:16:27 Gastric Passage time: 1h 77m Small Bowel Passage time:  4h 45m  Summary & Recommendations: Abnormal small intestine of uncertain significance. At least one area of ulceration. Polypoid lesions need further evaluation, ideally via optical enteroscopy with possible biopsy. This is best accomplished at a tertiary referral center.  At this time, Hgb has remained stable, and he is appropriate for discharge from a GI standpoint. Would recommend continuing PPI BID, avoidance of any NSAIDs or aspirin products.  Further recommendations to follow.   Nira Retort, ANP-BC [facility]  2:03 PM  Attending note:  I personally reviewed the capsule images. Note has been annotated accordingly. We'll obtain a KUB in 3 days to assess for capsule retention. Further recommendations to follow.

## 2012-12-04 NOTE — Progress Notes (Signed)
12/04/12 1315 Spoke with Gerrit Halls, NP this afternoon regarding results of givens capsule study. Notified her that Dr. Renard Matter would like to speak with GI regarding results before patient discharge later today if okay. Stated she was reviewing results and would speak with Dr. Renard Matter. Pt tolerated carb modified diet well for lunch, ambulated in hallway with nurse supervision this afternoon. Ambulated from his room, around nurses' station and back. Up to chair with chair alarm on for safety. Earnstine Regal, RN

## 2012-12-04 NOTE — Plan of Care (Signed)
Problem: Discharge Progression Outcomes Goal: Outpatient F/U arrangements in place Outcome: Adequate for Discharge 12/04/12 1620 Patient to follow-up with Dr. Sudie Bailey when he returns to office and GI follow-up in 4-6 weeks, office to call patient.

## 2012-12-04 NOTE — Progress Notes (Signed)
12/04/12 1625 patient okay for discharge per GI and MD. Reviewed discharge instructions with patient. Given copy of instructions, medication list, prescriptions called in to [facility] per Dr Renard Matter. Patient to follow-up with Dr Sudie Bailey in the next week once he returns to office and follow-up with GI in 4-6 weeks as instructed. IV site d/c'd and site within normal limits. No c/o pain or discomfort at time of discharge. Reviewed discharge instructions with patient's son as well. Pt left floor in stable condition via w/c accompanied by nurse. Earnstine Regal, RN

## 2012-12-05 ENCOUNTER — Telehealth: Payer: Self-pay | Admitting: Gastroenterology

## 2012-12-05 ENCOUNTER — Other Ambulatory Visit: Payer: Self-pay | Admitting: Gastroenterology

## 2012-12-05 DIAGNOSIS — K599 Functional intestinal disorder, unspecified: Secondary | ICD-10-CM

## 2012-12-05 DIAGNOSIS — D649 Anemia, unspecified: Secondary | ICD-10-CM

## 2012-12-05 NOTE — Discharge Summary (Signed)
[name], [name]               ACCOUNT NO.:  [id number]  MEDICAL RECORD NO.:  [id number]  LOCATION:  A327                          FACILITY:  [facility]  PHYSICIAN:  Keivon Garden G. Rhodie Cienfuegos, MD   DATE OF BIRTH:  1927/05/16  DATE OF ADMISSION:  11/28/2012 DATE OF DISCHARGE:  06/12/2014LH                              DISCHARGE SUMMARY   ADDENDUM:  The patient will be discharged on the following medications; amlodipine 10 mg daily, docusate sodium 100 mg b.i.d., Os-Cal D 500/200 one b.i.d., vitamin B12 1000 mcg b.i.d., Protonix 40 mg b.i.d.  The patient's capsule __________ was negative for evidence of continued bleeding.  He is to check back with Dr. Sudie Bailey on Monday for further recommendations.     Luis Desha G. Renard Matter, MD     AGM/MEDQ  D:  12/04/2012  T:  12/05/2012  Job:  [id number]

## 2012-12-05 NOTE — Telephone Encounter (Signed)
Capsule study reviewed in further detail with Dr. Jena Gauss. It does not appear that it reached the cecum.   He needs to have a KUB Sunday or Monday. Please arrange for patient.  Also, needs to be set up for CT enterography due to abnormal small bowel finding on capsule study. Please let patient know there was no evidence of overt bleeding, but there was a question of ulceration at one point and possible polyps.

## 2012-12-05 NOTE — Telephone Encounter (Signed)
CT will need prior authorization before scheduling, Im working on that now

## 2012-12-05 NOTE — Discharge Summary (Signed)
[name], [name]               ACCOUNT NO.:  [id number]  MEDICAL RECORD NO.:  [id number]  LOCATION:  A327                          FACILITY:  [facility]  PHYSICIAN:  Jandiel Magallanes G. Renard Matter, MD   DATE OF BIRTH:  August 04, 1926  DATE OF ADMISSION:  11/28/2012 DATE OF DISCHARGE:  06/12/2014LH                              DISCHARGE SUMMARY   Six days hospitalization.  DIAGNOSES:  Fracture of left radius and ulna styloid secondary to fall, anemia multifactorial, gastrointestinal bleeding of undetermined source, acute renal failure, diabetes mellitus, gastritis.  CONDITION:  Stable at discharge.  This patient presented to the emergency room with a chief complaint of dizziness which had been going on for several weeks.  Four days prior to coming to the emergency room, he fell and injured his left arm.  This turned out to be fracture of radius and ulna styloid.  He had not been drinking enough fluids and losing weight.  He has had an anemia and GI workup in the past had been negative.  They could not find the source of his bleeding.  EXAMINATION:  VITAL SIGNS:  Blood pressure 172/61, pulse 63, temp 97.3. HEENT:  Eyes, PERRLA.  TM negative.  Oropharynx benign. NECK:  Supple.  No JVD or thyroid abnormalities. HEART:  Regular rhythm.  No murmurs. LUNGS:  Clear to P and A. ABDOMEN:  No palpable organs or masses.  No organomegaly. EXTREMITIES:  Free of edema.  LABORATORY DATA:  Admission CBC, WBC 3.2, hemoglobin 8.8, hematocrit 26.6.  Chemistries:  Sodium 143, potassium 4.9, chloride 112, CO2 of 22, glucose 123, BUN 41, creatinine 1.93, calcium 9.3, total protein 6.4, albumin 3.0, AST 12, ALT 6, bilirubin 0.2.  Urinalysis negative. Subsequent blood sugars ranged from 85-120.  Subsequent hemoglobin towards the latter part of hospitalization at 8.2 with hematocrit 24.7. Subsequent chemistries sodium 141, potassium 4.3, chloride 111, CO2 of 23, glucose 86, BUN 24, creatinine 1.48.  RADIOLOGY:  Chest  x-ray on admission, mild left basilar opacity, likely atelectasis.  CT of the head on admission is stable.  No acute intracranial abnormality.  Atrophy and small vessel white matter ischemic demyelinization.  X-ray of left arm and shoulder displaced distal radial fracture, nondisplaced ulnar styloid fracture.  X-rays of left knee no acute radiographic abnormality.  HOSPITAL COURSE:  The patient on admission was placed on 0.9% sodium chloride solution.  He was continued on amlodipine 10 mg daily, Os-Cal D 500/200 mg daily, docusate sodium 100 mg daily, insulin NovoLog t.i.d., Protonix 40 mg daily, MiraLax 17 g t.i.d., vitamin B12 3 mL q.12 hours. He was also continued on his pain meds Tylenol suppositories.  The patient remained relatively stable, was seen in consultation during his hospital stay by Orthopedic service.  Dr. Romeo Apple who felt that the splint on his left wrist could be maintained as he had nondisplaced fracture.  Repeat x-rays after 6 weeks.  He was also seen in consultation by Laurette Schimke and Neurology service, was scheduled for an EGD as well as completed.  Did not find a source of bleeding, but evidence of gastritis and subsequently scheduled him for Givens capsule study which was started yesterday and still has not been completed  at the time of this dictation.  The patient remains relatively stable and does have chronic anemia which is multifactorial.     Kymberly Blomberg G. Renard Matter, MD     AGM/MEDQ  D:  12/04/2012  T:  12/05/2012  Job:  [id number]

## 2012-12-05 NOTE — Telephone Encounter (Signed)
Called and told Major that pt can just go to radiology on Mon when he is ready for the x-ray.

## 2012-12-05 NOTE — Telephone Encounter (Signed)
Per Soledad Gerlach pt can just go to radiology when ready. Called to let pt know. VM not set up.

## 2012-12-05 NOTE — Telephone Encounter (Signed)
Called and spoke to pt's son, Major. He prefers x-ray for Monday. Please let them know what time to go.

## 2012-12-08 ENCOUNTER — Encounter: Payer: Self-pay | Admitting: Gastroenterology

## 2012-12-08 ENCOUNTER — Other Ambulatory Visit: Payer: Self-pay | Admitting: Gastroenterology

## 2012-12-08 ENCOUNTER — Telehealth: Payer: Self-pay | Admitting: Gastroenterology

## 2012-12-08 DIAGNOSIS — D649 Anemia, unspecified: Secondary | ICD-10-CM

## 2012-12-08 NOTE — Telephone Encounter (Signed)
Patient needs to have KUB today.  Has he had this done yet?

## 2012-12-08 NOTE — Telephone Encounter (Signed)
Noted. Agree.

## 2012-12-08 NOTE — Telephone Encounter (Signed)
Patient is scheduled for CT Enter on Wednesday June 18th at 9:30 and I am trying to reach Major his son to inform him of date & time

## 2012-12-08 NOTE — Telephone Encounter (Signed)
Pt's son, Major called and asked could he get the KUB done on Fri ( pt also has another test scheduled for then) . I spoke to Gerrit Halls, NP and she said he really needs to have it done today, but Major is in [place] at a doctors visit for himself. I asked him to have it done first thing in the morning and he said his Dad goes to bed at 8:30 pm and sleeps until about noon, and he cannot get him up. He said he will try to take him tomorrow.

## 2012-12-08 NOTE — Telephone Encounter (Signed)
Called and VM not set up

## 2012-12-09 ENCOUNTER — Ambulatory Visit (HOSPITAL_COMMUNITY)
Admission: RE | Admit: 2012-12-09 | Discharge: 2012-12-09 | Disposition: A | Discharge status: Home / Self Care | Payer: Medicare Other | Source: Ambulatory Visit | Attending: Gastroenterology | Admitting: Gastroenterology

## 2012-12-09 DIAGNOSIS — D649 Anemia, unspecified: Secondary | ICD-10-CM

## 2012-12-09 DIAGNOSIS — D509 Iron deficiency anemia, unspecified: Secondary | ICD-10-CM | POA: Insufficient documentation

## 2012-12-10 ENCOUNTER — Ambulatory Visit (HOSPITAL_COMMUNITY): Payer: Medicare Other

## 2012-12-10 NOTE — Progress Notes (Signed)
Quick Note:  No retention of capsule.  Proceed as planned with CTE ______

## 2012-12-12 ENCOUNTER — Ambulatory Visit (HOSPITAL_COMMUNITY): Admission: RE | Admit: 2012-12-12 | Payer: Medicare Other | Source: Ambulatory Visit

## 2012-12-16 ENCOUNTER — Ambulatory Visit (HOSPITAL_COMMUNITY)
Admission: RE | Admit: 2012-12-16 | Discharge: 2012-12-16 | Disposition: A | Discharge status: Home / Self Care | Payer: Medicare Other | Source: Ambulatory Visit | Attending: Gastroenterology | Admitting: Gastroenterology

## 2012-12-16 ENCOUNTER — Other Ambulatory Visit (HOSPITAL_COMMUNITY): Payer: Medicare Other

## 2012-12-16 DIAGNOSIS — D649 Anemia, unspecified: Secondary | ICD-10-CM | POA: Insufficient documentation

## 2012-12-16 DIAGNOSIS — R933 Abnormal findings on diagnostic imaging of other parts of digestive tract: Secondary | ICD-10-CM | POA: Insufficient documentation

## 2012-12-16 DIAGNOSIS — K8020 Calculus of gallbladder without cholecystitis without obstruction: Secondary | ICD-10-CM | POA: Insufficient documentation

## 2012-12-16 MED ORDER — IOHEXOL 300 MG/ML  SOLN
100.0000 mL | Freq: Once | INTRAMUSCULAR | Status: AC | PRN
  Administered 2012-12-16: 100 mL via INTRAVENOUS

## 2012-12-16 MED ORDER — IOHEXOL 300 MG/ML  SOLN: 125.0000 mL | Freq: Once | INTRAMUSCULAR | Status: AC | PRN

## 2012-12-31 NOTE — Progress Notes (Signed)
Quick Note:  Called and no answer. ( Can tell pt or son Major when they return call, both have same phone number). ______

## 2012-12-31 NOTE — Progress Notes (Signed)
Quick Note:  CTE reviewed.  No outright evidence of small bowel abnormality. We need to refer him to [facility] due to abnormal capsule study.  We will need to have his capsule images burned to a CD and mailed to [facility] so they may have at time of appointment.  ______

## 2013-01-01 ENCOUNTER — Telehealth: Payer: Self-pay

## 2013-01-01 NOTE — Telephone Encounter (Signed)
Pt's son, Major, called and said it is OK to discuss results and plan with his Fiance' Tammy Taffer since she helps with the care of pt. Per Gerrit Halls, NP that will be fine. He will have Tammy call.

## 2013-01-01 NOTE — Telephone Encounter (Signed)
T/C from pt's son, [name]'s fiancee' , [name]. She said [name] wanted her to call and get info, he had phone problems yesterday. I explained that I could not talk to her, and she said she was aware. She finally said that the phone signal in [place] is so bad. So I told her to have [name] call when he can. [name] was talking to him yesterday and he had problems. She did say that they said he needed to go to [facility]. So I told her the test was OK and they were going to do a referral, but please have [name] call.

## 2013-01-01 NOTE — Progress Notes (Signed)
Quick Note:  LATE ENTRY: Pt's son returned call yesterday and was informed. He had questions and spoke to [place]. ______

## 2013-01-13 NOTE — Progress Notes (Signed)
Patient ID: [name], male   DOB: [birth year], 79 y.o.   MRN: [id number] Tammy called today and want to know why he was going to [facility]. I told her that it was for a second option for the polys in the small  Intestine. She was going to talk to him and his PCP and let us know.

## 2014-04-06 ENCOUNTER — Encounter: Payer: Self-pay | Admitting: Gastroenterology

## 2015-09-06 ENCOUNTER — Emergency Department (HOSPITAL_COMMUNITY)
Admission: EM | Admit: 2015-09-06 | Discharge: 2015-09-06 | Disposition: A | Discharge status: Home / Self Care | Payer: Medicare Other | Attending: Emergency Medicine | Admitting: Emergency Medicine

## 2015-09-06 ENCOUNTER — Encounter (HOSPITAL_COMMUNITY): Payer: Self-pay | Admitting: *Deleted

## 2015-09-06 ENCOUNTER — Emergency Department (HOSPITAL_COMMUNITY): Payer: Medicare Other

## 2015-09-06 DIAGNOSIS — Z79899 Other long term (current) drug therapy: Secondary | ICD-10-CM | POA: Diagnosis not present

## 2015-09-06 DIAGNOSIS — J209 Acute bronchitis, unspecified: Secondary | ICD-10-CM | POA: Insufficient documentation

## 2015-09-06 DIAGNOSIS — E119 Type 2 diabetes mellitus without complications: Secondary | ICD-10-CM | POA: Diagnosis not present

## 2015-09-06 DIAGNOSIS — J40 Bronchitis, not specified as acute or chronic: Secondary | ICD-10-CM

## 2015-09-06 DIAGNOSIS — J9801 Acute bronchospasm: Secondary | ICD-10-CM

## 2015-09-06 DIAGNOSIS — R05 Cough: Secondary | ICD-10-CM | POA: Diagnosis present

## 2015-09-06 LAB — COMPREHENSIVE METABOLIC PANEL
ALBUMIN: 3.8 g/dL (ref 3.5–5.0)
ALT: 12 U/L — ABNORMAL LOW (ref 17–63)
ANION GAP: 7 (ref 5–15)
AST: 28 U/L (ref 15–41)
Alkaline Phosphatase: 62 U/L (ref 38–126)
BILIRUBIN TOTAL: 0.8 mg/dL (ref 0.3–1.2)
BUN: 31 mg/dL — ABNORMAL HIGH (ref 6–20)
CO2: 23 mmol/L (ref 22–32)
Calcium: 8.9 mg/dL (ref 8.9–10.3)
Chloride: 104 mmol/L (ref 101–111)
Creatinine, Ser: 2.43 mg/dL — ABNORMAL HIGH (ref 0.61–1.24)
GFR, EST AFRICAN AMERICAN: 26 mL/min — AB (ref >60)
GFR, EST NON AFRICAN AMERICAN: 23 mL/min — AB (ref >60)
Glucose, Bld: 104 mg/dL — ABNORMAL HIGH (ref 65–99)
POTASSIUM: 5.9 mmol/L — AB (ref 3.5–5.1)
Sodium: 134 mmol/L — ABNORMAL LOW (ref 135–145)
TOTAL PROTEIN: 7.6 g/dL (ref 6.5–8.1)

## 2015-09-06 LAB — POTASSIUM: POTASSIUM: 4.6 mmol/L (ref 3.5–5.1)

## 2015-09-06 LAB — CBC WITH DIFFERENTIAL/PLATELET
BASOS PCT: 1 %
Basophils Absolute: 0 10*3/uL (ref 0.0–0.1)
Eosinophils Absolute: 0 10*3/uL (ref 0.0–0.7)
Eosinophils Relative: 1 %
HEMATOCRIT: 31 % — AB (ref 39.0–52.0)
Hemoglobin: 10.1 g/dL — ABNORMAL LOW (ref 13.0–17.0)
Lymphocytes Relative: 26 %
Lymphs Abs: 1 10*3/uL (ref 0.7–4.0)
MCH: 29.7 pg (ref 26.0–34.0)
MCHC: 32.6 g/dL (ref 30.0–36.0)
MCV: 91.2 fL (ref 78.0–100.0)
MONO ABS: 0.5 10*3/uL (ref 0.1–1.0)
MONOS PCT: 14 %
NEUTROS ABS: 2.2 10*3/uL (ref 1.7–7.7)
Neutrophils Relative %: 58 %
Platelets: 168 10*3/uL (ref 150–400)
RBC: 3.4 MIL/uL — ABNORMAL LOW (ref 4.22–5.81)
RDW: 14.1 % (ref 11.5–15.5)
WBC: 3.8 10*3/uL — ABNORMAL LOW (ref 4.0–10.5)

## 2015-09-06 MED ORDER — IPRATROPIUM-ALBUTEROL 0.5-2.5 (3) MG/3ML IN SOLN
3.0000 mL | Freq: Once | RESPIRATORY_TRACT | Status: AC
  Administered 2015-09-06: 3 mL via RESPIRATORY_TRACT
  Filled 2015-09-06: qty 3

## 2015-09-06 MED ORDER — ALBUTEROL SULFATE (2.5 MG/3ML) 0.083% IN NEBU
2.5000 mg | INHALATION_SOLUTION | Freq: Once | RESPIRATORY_TRACT | Status: AC
  Administered 2015-09-06: 2.5 mg via RESPIRATORY_TRACT
  Filled 2015-09-06: qty 3

## 2015-09-06 MED ORDER — METHYLPREDNISOLONE SODIUM SUCC 125 MG IJ SOLR
125.0000 mg | Freq: Once | INTRAMUSCULAR | Status: AC
  Administered 2015-09-06: 125 mg via INTRAVENOUS
  Filled 2015-09-06: qty 2

## 2015-09-06 MED ORDER — LEVOFLOXACIN 500 MG PO TABS: 500.0000 mg | ORAL_TABLET | Freq: Every day | ORAL | Status: DC
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
## 2015-09-06 NOTE — Discharge Instructions (Signed)
Used the albuterol nebulizer 4 times a day as needed for cough and wheezing. Follow-up with her family doctor this week

## 2015-09-06 NOTE — ED Notes (Signed)
Pt comes in for productive cough starting x1 week ago. NAD noted. Pt is extremely hard of hearing.

## 2015-09-06 NOTE — ED Notes (Signed)
Patient transported to X-ray 

## 2017-04-26 ENCOUNTER — Emergency Department (HOSPITAL_COMMUNITY): Payer: Medicare Other

## 2017-04-26 ENCOUNTER — Encounter (HOSPITAL_COMMUNITY): Payer: Self-pay | Admitting: *Deleted

## 2017-04-26 ENCOUNTER — Emergency Department (HOSPITAL_COMMUNITY)
Admission: EM | Admit: 2017-04-26 | Discharge: 2017-04-27 | Disposition: A | Discharge status: Home / Self Care | Payer: Medicare Other | Attending: Emergency Medicine | Admitting: Emergency Medicine

## 2017-04-26 DIAGNOSIS — R531 Weakness: Secondary | ICD-10-CM | POA: Diagnosis not present

## 2017-04-26 DIAGNOSIS — F0390 Unspecified dementia without behavioral disturbance: Secondary | ICD-10-CM | POA: Insufficient documentation

## 2017-04-26 DIAGNOSIS — Z79899 Other long term (current) drug therapy: Secondary | ICD-10-CM | POA: Insufficient documentation

## 2017-04-26 DIAGNOSIS — E119 Type 2 diabetes mellitus without complications: Secondary | ICD-10-CM | POA: Insufficient documentation

## 2017-04-26 DIAGNOSIS — R5383 Other fatigue: Secondary | ICD-10-CM | POA: Insufficient documentation

## 2017-04-26 LAB — COMPREHENSIVE METABOLIC PANEL
ALBUMIN: 3.6 g/dL (ref 3.5–5.0)
ALK PHOS: 50 U/L (ref 38–126)
ALT: 9 U/L — AB (ref 17–63)
AST: 16 U/L (ref 15–41)
Anion gap: 10 (ref 5–15)
BUN: 35 mg/dL — ABNORMAL HIGH (ref 6–20)
CALCIUM: 9.7 mg/dL (ref 8.9–10.3)
CO2: 22 mmol/L (ref 22–32)
CREATININE: 2.39 mg/dL — AB (ref 0.61–1.24)
Chloride: 110 mmol/L (ref 101–111)
GFR calc non Af Amer: 23 mL/min — ABNORMAL LOW (ref >60)
GFR, EST AFRICAN AMERICAN: 26 mL/min — AB (ref >60)
GLUCOSE: 236 mg/dL — AB (ref 65–99)
Potassium: 5 mmol/L (ref 3.5–5.1)
SODIUM: 142 mmol/L (ref 135–145)
Total Bilirubin: 0.4 mg/dL (ref 0.3–1.2)
Total Protein: 7.1 g/dL (ref 6.5–8.1)

## 2017-04-26 LAB — CBC WITH DIFFERENTIAL/PLATELET
Basophils Absolute: 0 10*3/uL (ref 0.0–0.1)
Basophils Relative: 1 %
EOS ABS: 0 10*3/uL (ref 0.0–0.7)
Eosinophils Relative: 1 %
HEMATOCRIT: 33.6 % — AB (ref 39.0–52.0)
HEMOGLOBIN: 10.5 g/dL — AB (ref 13.0–17.0)
LYMPHS ABS: 0.8 10*3/uL (ref 0.7–4.0)
Lymphocytes Relative: 14 %
MCH: 30 pg (ref 26.0–34.0)
MCHC: 31.3 g/dL (ref 30.0–36.0)
MCV: 96 fL (ref 78.0–100.0)
Monocytes Absolute: 0.4 10*3/uL (ref 0.1–1.0)
Monocytes Relative: 8 %
NEUTROS ABS: 4.4 10*3/uL (ref 1.7–7.7)
NEUTROS PCT: 76 %
Platelets: 170 10*3/uL (ref 150–400)
RBC: 3.5 MIL/uL — AB (ref 4.22–5.81)
RDW: 14.1 % (ref 11.5–15.5)
WBC: 5.6 10*3/uL (ref 4.0–10.5)
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
## 2017-04-26 NOTE — ED Triage Notes (Addendum)
Pt caregiver reports recent shingles, Dx'd 1 week ago But now pt with malaise and will not get out of bed, N Dr Karie Kirks is PCP

## 2017-04-26 NOTE — ED Triage Notes (Signed)
Pt had recent diagnose of shingles that are dried up now. Daughter-in-law states that the pt makes himself throw up, acts sick (sob and states he can't walk but can) but when the daughter in law tells him to stop he starts acting normal again. Daughter in law states that the pt will take his walker that he can sit on and hides behind trees and cars so he can sleep. Pt HOH and doesn't acknowledge me speaking to him. Daughter-in-law states all pt wants to do is sleep.

## 2017-04-27 LAB — URINALYSIS, ROUTINE W REFLEX MICROSCOPIC
BILIRUBIN URINE: NEGATIVE
Glucose, UA: NEGATIVE mg/dL
HGB URINE DIPSTICK: NEGATIVE
KETONES UR: NEGATIVE mg/dL
LEUKOCYTES UA: NEGATIVE
Nitrite: NEGATIVE
PROTEIN: 100 mg/dL — AB
Specific Gravity, Urine: 1.015 (ref 1.005–1.030)
pH: 5 (ref 5.0–8.0)

## 2017-04-27 NOTE — ED Provider Notes (Signed)
Pt ambulatory U/a negative Daughter in law feels comfortable taking patient home No distress noted   Ripley Fraise, MD 04/27/17 [phone]

## 2017-04-27 NOTE — ED Notes (Signed)
Pt ambulated around nurses desk with steady gait, O2 sat 98% lowest

## 2017-12-28 ENCOUNTER — Other Ambulatory Visit: Payer: Self-pay

## 2017-12-28 ENCOUNTER — Encounter (HOSPITAL_COMMUNITY): Payer: Self-pay | Admitting: *Deleted

## 2017-12-28 ENCOUNTER — Emergency Department (HOSPITAL_COMMUNITY): Payer: Medicare Other

## 2017-12-28 ENCOUNTER — Inpatient Hospital Stay (HOSPITAL_COMMUNITY)
Admission: EM | Admit: 2017-12-28 | Discharge: 2018-01-03 | DRG: 480 | Disposition: A | Discharge status: Skilled Nursing Facility | Payer: Medicare Other | Attending: Internal Medicine | Admitting: Internal Medicine

## 2017-12-28 DIAGNOSIS — H9190 Unspecified hearing loss, unspecified ear: Secondary | ICD-10-CM | POA: Diagnosis present

## 2017-12-28 DIAGNOSIS — Y92008 Other place in unspecified non-institutional (private) residence as the place of occurrence of the external cause: Secondary | ICD-10-CM | POA: Diagnosis not present

## 2017-12-28 DIAGNOSIS — N185 Chronic kidney disease, stage 5: Secondary | ICD-10-CM | POA: Diagnosis present

## 2017-12-28 DIAGNOSIS — W19XXXA Unspecified fall, initial encounter: Secondary | ICD-10-CM | POA: Diagnosis not present

## 2017-12-28 DIAGNOSIS — D649 Anemia, unspecified: Secondary | ICD-10-CM | POA: Diagnosis not present

## 2017-12-28 DIAGNOSIS — N184 Chronic kidney disease, stage 4 (severe): Secondary | ICD-10-CM | POA: Diagnosis not present

## 2017-12-28 DIAGNOSIS — D631 Anemia in chronic kidney disease: Secondary | ICD-10-CM | POA: Diagnosis present

## 2017-12-28 DIAGNOSIS — E1122 Type 2 diabetes mellitus with diabetic chronic kidney disease: Secondary | ICD-10-CM | POA: Diagnosis present

## 2017-12-28 DIAGNOSIS — F0390 Unspecified dementia without behavioral disturbance: Secondary | ICD-10-CM | POA: Diagnosis present

## 2017-12-28 DIAGNOSIS — G9341 Metabolic encephalopathy: Secondary | ICD-10-CM | POA: Diagnosis present

## 2017-12-28 DIAGNOSIS — E875 Hyperkalemia: Secondary | ICD-10-CM | POA: Diagnosis not present

## 2017-12-28 DIAGNOSIS — W1830XA Fall on same level, unspecified, initial encounter: Secondary | ICD-10-CM | POA: Diagnosis not present

## 2017-12-28 DIAGNOSIS — S72002A Fracture of unspecified part of neck of left femur, initial encounter for closed fracture: Secondary | ICD-10-CM | POA: Diagnosis not present

## 2017-12-28 DIAGNOSIS — G9340 Encephalopathy, unspecified: Secondary | ICD-10-CM

## 2017-12-28 DIAGNOSIS — I120 Hypertensive chronic kidney disease with stage 5 chronic kidney disease or end stage renal disease: Secondary | ICD-10-CM | POA: Diagnosis present

## 2017-12-28 DIAGNOSIS — N179 Acute kidney failure, unspecified: Secondary | ICD-10-CM | POA: Diagnosis not present

## 2017-12-28 DIAGNOSIS — S72142A Displaced intertrochanteric fracture of left femur, initial encounter for closed fracture: Secondary | ICD-10-CM

## 2017-12-28 DIAGNOSIS — Z419 Encounter for procedure for purposes other than remedying health state, unspecified: Secondary | ICD-10-CM | POA: Diagnosis not present

## 2017-12-28 DIAGNOSIS — D62 Acute posthemorrhagic anemia: Secondary | ICD-10-CM | POA: Diagnosis not present

## 2017-12-28 DIAGNOSIS — Z79899 Other long term (current) drug therapy: Secondary | ICD-10-CM | POA: Diagnosis not present

## 2017-12-28 DIAGNOSIS — S72009A Fracture of unspecified part of neck of unspecified femur, initial encounter for closed fracture: Secondary | ICD-10-CM | POA: Diagnosis present

## 2017-12-28 LAB — CBC WITH DIFFERENTIAL/PLATELET
BASOS ABS: 0 10*3/uL (ref 0.0–0.1)
BASOS PCT: 1 %
Eosinophils Absolute: 0 10*3/uL (ref 0.0–0.7)
Eosinophils Relative: 1 %
HEMATOCRIT: 24.4 % — AB (ref 39.0–52.0)
Hemoglobin: 7.6 g/dL — ABNORMAL LOW (ref 13.0–17.0)
LYMPHS PCT: 11 %
Lymphs Abs: 0.6 10*3/uL — ABNORMAL LOW (ref 0.7–4.0)
MCH: 29.7 pg (ref 26.0–34.0)
MCHC: 31.1 g/dL (ref 30.0–36.0)
MCV: 95.3 fL (ref 78.0–100.0)
MONO ABS: 0.5 10*3/uL (ref 0.1–1.0)
MONOS PCT: 9 %
NEUTROS ABS: 4.3 10*3/uL (ref 1.7–7.7)
Neutrophils Relative %: 78 %
PLATELETS: 133 10*3/uL — AB (ref 150–400)
RBC: 2.56 MIL/uL — ABNORMAL LOW (ref 4.22–5.81)
RDW: 14.6 % (ref 11.5–15.5)
WBC: 5.4 10*3/uL (ref 4.0–10.5)

## 2017-12-28 LAB — BASIC METABOLIC PANEL
ANION GAP: 5 (ref 5–15)
BUN: 34 mg/dL — AB (ref 8–23)
CALCIUM: 8.4 mg/dL — AB (ref 8.9–10.3)
CO2: 23 mmol/L (ref 22–32)
Chloride: 111 mmol/L (ref 98–111)
Creatinine, Ser: 1.75 mg/dL — ABNORMAL HIGH (ref 0.61–1.24)
GFR calc Af Amer: 38 mL/min — ABNORMAL LOW (ref >60)
GFR, EST NON AFRICAN AMERICAN: 33 mL/min — AB (ref >60)
GLUCOSE: 127 mg/dL — AB (ref 70–99)
Potassium: 5.1 mmol/L (ref 3.5–5.1)
Sodium: 139 mmol/L (ref 135–145)

## 2017-12-28 LAB — CBG MONITORING, ED: Glucose-Capillary: 119 mg/dL — ABNORMAL HIGH (ref 70–99)

## 2017-12-28 LAB — PROTIME-INR
INR: 1.16
Prothrombin Time: 14.7 seconds (ref 11.4–15.2)

## 2017-12-28 MED ORDER — HEPARIN SODIUM (PORCINE) 5000 UNIT/ML IJ SOLN
5000.0000 [IU] | Freq: Three times a day (TID) | INTRAMUSCULAR | Status: DC
  Administered 2017-12-29 – 2018-01-01 (×8): 5000 [IU] via SUBCUTANEOUS
  Filled 2017-12-28 (×8): qty 1

## 2017-12-28 MED ORDER — ONDANSETRON HCL 4 MG/2ML IJ SOLN
4.0000 mg | Freq: Four times a day (QID) | INTRAMUSCULAR | Status: DC | PRN
  Administered 2017-12-28: 4 mg via INTRAVENOUS
  Filled 2017-12-28: qty 2

## 2017-12-28 MED ORDER — ONDANSETRON HCL 4 MG PO TABS: 4.0000 mg | ORAL_TABLET | Freq: Four times a day (QID) | ORAL | Status: DC | PRN

## 2017-12-28 MED ORDER — HYDROCODONE-ACETAMINOPHEN 5-325 MG PO TABS
1.0000 | ORAL_TABLET | ORAL | Status: DC | PRN
  Administered 2017-12-28: 2 via ORAL
  Filled 2017-12-28: qty 2

## 2017-12-28 MED ORDER — SODIUM CHLORIDE 0.9 % IV SOLN
INTRAVENOUS | Status: DC
  Administered 2017-12-29: 75 mL/h via INTRAVENOUS

## 2017-12-28 NOTE — Progress Notes (Signed)
Patient arrived from [place] via [facility] to [facility]. Patient Placement RN notified.

## 2017-12-28 NOTE — ED Notes (Signed)
Report to Tiffany with [facility]  
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
## 2017-12-28 NOTE — ED Triage Notes (Signed)
Pt was attempting to sit down on a chair on his deck.  He sat on the arm of the chair causing him to twist and fall to the ground.  Pt did not hit his head, no LOC.  Pt reports pain in left thigh.

## 2017-12-28 NOTE — ED Notes (Signed)
[facility] on unit for pt transfer
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
## 2017-12-28 NOTE — ED Notes (Signed)
Report to Amy, RN-[facility] 5N

## 2017-12-29 ENCOUNTER — Encounter (HOSPITAL_COMMUNITY)
Admission: EM | Disposition: A | Discharge status: Skilled Nursing Facility | Payer: Self-pay | Source: Home / Self Care | Attending: Internal Medicine

## 2017-12-29 ENCOUNTER — Inpatient Hospital Stay (HOSPITAL_COMMUNITY): Payer: Medicare Other | Admitting: Certified Registered Nurse Anesthetist

## 2017-12-29 ENCOUNTER — Encounter (HOSPITAL_COMMUNITY): Payer: Self-pay | Admitting: Certified Registered Nurse Anesthetist

## 2017-12-29 ENCOUNTER — Inpatient Hospital Stay (HOSPITAL_COMMUNITY): Payer: Medicare Other

## 2017-12-29 DIAGNOSIS — S72002A Fracture of unspecified part of neck of left femur, initial encounter for closed fracture: Secondary | ICD-10-CM

## 2017-12-29 DIAGNOSIS — S72142A Displaced intertrochanteric fracture of left femur, initial encounter for closed fracture: Secondary | ICD-10-CM

## 2017-12-29 DIAGNOSIS — D631 Anemia in chronic kidney disease: Secondary | ICD-10-CM

## 2017-12-29 DIAGNOSIS — N184 Chronic kidney disease, stage 4 (severe): Secondary | ICD-10-CM

## 2017-12-29 HISTORY — PX: ORIF FEMUR FRACTURE: SHX2119

## 2017-12-29 LAB — PREPARE RBC (CROSSMATCH)

## 2017-12-29 LAB — GLUCOSE, CAPILLARY
GLUCOSE-CAPILLARY: 99 mg/dL (ref 70–99)
GLUCOSE-CAPILLARY: 87 mg/dL (ref 70–99)
Glucose-Capillary: 121 mg/dL — ABNORMAL HIGH (ref 70–99)

## 2017-12-29 LAB — MRSA PCR SCREENING: MRSA by PCR: NEGATIVE

## 2017-12-29 LAB — ABO/RH: ABO/RH(D): O POS

## 2017-12-29 SURGERY — OPEN REDUCTION INTERNAL FIXATION (ORIF) DISTAL FEMUR FRACTURE
Anesthesia: Monitor Anesthesia Care | Site: Hip | Laterality: Left

## 2017-12-29 MED ORDER — PHENYLEPHRINE HCL 10 MG/ML IJ SOLN
INTRAVENOUS | Status: DC | PRN
  Administered 2017-12-29: 50 ug/min via INTRAVENOUS

## 2017-12-29 MED ORDER — PROPOFOL 10 MG/ML IV BOLUS
INTRAVENOUS | Status: AC
  Filled 2017-12-29: qty 20

## 2017-12-29 MED ORDER — MORPHINE SULFATE (PF) 2 MG/ML IV SOLN: 0.5000 mg | INTRAVENOUS | Status: DC | PRN

## 2017-12-29 MED ORDER — FENTANYL CITRATE (PF) 250 MCG/5ML IJ SOLN
INTRAMUSCULAR | Status: DC | PRN
  Administered 2017-12-29: 50 ug via INTRAVENOUS

## 2017-12-29 MED ORDER — 0.9 % SODIUM CHLORIDE (POUR BTL) OPTIME
TOPICAL | Status: DC | PRN
  Administered 2017-12-29: 1000 mL

## 2017-12-29 MED ORDER — HYDROCODONE-ACETAMINOPHEN 7.5-325 MG PO TABS
1.0000 | ORAL_TABLET | ORAL | Status: DC | PRN
  Administered 2017-12-30: 1 via ORAL
  Filled 2017-12-29: qty 1

## 2017-12-29 MED ORDER — FENTANYL CITRATE (PF) 100 MCG/2ML IJ SOLN: 25.0000 ug | INTRAMUSCULAR | Status: DC | PRN

## 2017-12-29 MED ORDER — HYDROCODONE-ACETAMINOPHEN 5-325 MG PO TABS
1.0000 | ORAL_TABLET | ORAL | Status: DC | PRN
  Administered 2017-12-30 – 2018-01-01 (×3): 1 via ORAL
  Filled 2017-12-29 (×2): qty 1
  Filled 2017-12-29: qty 2
  Filled 2017-12-29: qty 1

## 2017-12-29 MED ORDER — ACETAMINOPHEN 325 MG PO TABS
325.0000 mg | ORAL_TABLET | Freq: Four times a day (QID) | ORAL | Status: DC | PRN
  Administered 2017-12-30 – 2017-12-31 (×3): 650 mg via ORAL
  Filled 2017-12-29 (×3): qty 2

## 2017-12-29 MED ORDER — PHENYLEPHRINE HCL 10 MG/ML IJ SOLN
INTRAMUSCULAR | Status: DC | PRN
  Administered 2017-12-29: 80 ug via INTRAVENOUS

## 2017-12-29 MED ORDER — ONDANSETRON HCL 4 MG/2ML IJ SOLN
INTRAMUSCULAR | Status: DC | PRN
  Administered 2017-12-29: 4 mg via INTRAVENOUS

## 2017-12-29 MED ORDER — METHOCARBAMOL 1000 MG/10ML IJ SOLN
500.0000 mg | Freq: Four times a day (QID) | INTRAVENOUS | Status: DC | PRN
  Filled 2017-12-29: qty 5

## 2017-12-29 MED ORDER — METOCLOPRAMIDE HCL 5 MG PO TABS: 5.0000 mg | ORAL_TABLET | Freq: Three times a day (TID) | ORAL | Status: DC | PRN

## 2017-12-29 MED ORDER — ONDANSETRON HCL 4 MG/2ML IJ SOLN: 4.0000 mg | Freq: Once | INTRAMUSCULAR | Status: DC | PRN

## 2017-12-29 MED ORDER — ONDANSETRON HCL 4 MG PO TABS: 4.0000 mg | ORAL_TABLET | Freq: Four times a day (QID) | ORAL | Status: DC | PRN

## 2017-12-29 MED ORDER — LACTATED RINGERS IV SOLN
INTRAVENOUS | Status: DC | PRN
  Administered 2017-12-29: 10:00:00 via INTRAVENOUS

## 2017-12-29 MED ORDER — DOCUSATE SODIUM 100 MG PO CAPS
100.0000 mg | ORAL_CAPSULE | Freq: Two times a day (BID) | ORAL | Status: DC
  Administered 2017-12-30 – 2018-01-01 (×5): 100 mg via ORAL
  Filled 2017-12-29 (×5): qty 1

## 2017-12-29 MED ORDER — METHOCARBAMOL 500 MG PO TABS
500.0000 mg | ORAL_TABLET | Freq: Four times a day (QID) | ORAL | Status: DC | PRN
  Administered 2017-12-30 (×2): 500 mg via ORAL
  Filled 2017-12-29 (×2): qty 1

## 2017-12-29 MED ORDER — CEFAZOLIN SODIUM-DEXTROSE 2-4 GM/100ML-% IV SOLN
2.0000 g | Freq: Once | INTRAVENOUS | Status: AC
  Administered 2017-12-29: 2 g via INTRAVENOUS
  Filled 2017-12-29: qty 100

## 2017-12-29 MED ORDER — PHENOL 1.4 % MT LIQD: 1.0000 | OROMUCOSAL | Status: DC | PRN

## 2017-12-29 MED ORDER — PROPOFOL 500 MG/50ML IV EMUL
INTRAVENOUS | Status: DC | PRN
  Administered 2017-12-29: 50 ug/kg/min via INTRAVENOUS

## 2017-12-29 MED ORDER — MENTHOL 3 MG MT LOZG: 1.0000 | LOZENGE | OROMUCOSAL | Status: DC | PRN

## 2017-12-29 MED ORDER — ONDANSETRON HCL 4 MG/2ML IJ SOLN: 4.0000 mg | Freq: Four times a day (QID) | INTRAMUSCULAR | Status: DC | PRN

## 2017-12-29 MED ORDER — ASPIRIN EC 325 MG PO TBEC
325.0000 mg | DELAYED_RELEASE_TABLET | Freq: Every day | ORAL | Status: DC
  Administered 2017-12-30 – 2018-01-03 (×5): 325 mg via ORAL
  Filled 2017-12-29 (×5): qty 1

## 2017-12-29 MED ORDER — ALUM & MAG HYDROXIDE-SIMETH 200-200-20 MG/5ML PO SUSP: 30.0000 mL | ORAL | Status: DC | PRN

## 2017-12-29 MED ORDER — HYDRALAZINE HCL 25 MG PO TABS
25.0000 mg | ORAL_TABLET | Freq: Three times a day (TID) | ORAL | Status: DC | PRN
Start: 2017-12-29 — End: 2018-01-03
  Administered 2017-12-30 – 2018-01-03 (×5): 25 mg via ORAL
  Filled 2017-12-29 (×5): qty 1

## 2017-12-29 MED ORDER — CEFAZOLIN SODIUM-DEXTROSE 2-4 GM/100ML-% IV SOLN
2.0000 g | Freq: Four times a day (QID) | INTRAVENOUS | Status: AC
  Administered 2017-12-29 – 2017-12-30 (×2): 2 g via INTRAVENOUS
  Filled 2017-12-29 (×3): qty 100

## 2017-12-29 MED ORDER — METOCLOPRAMIDE HCL 5 MG/ML IJ SOLN: 5.0000 mg | Freq: Three times a day (TID) | INTRAMUSCULAR | Status: DC | PRN

## 2017-12-29 MED ORDER — FENTANYL CITRATE (PF) 250 MCG/5ML IJ SOLN
INTRAMUSCULAR | Status: AC
  Filled 2017-12-29: qty 5

## 2017-12-29 MED ORDER — PROPOFOL 10 MG/ML IV BOLUS
INTRAVENOUS | Status: DC | PRN
  Administered 2017-12-29: 10 mg via INTRAVENOUS

## 2017-12-29 MED ORDER — SODIUM CHLORIDE 0.9% IV SOLUTION: Freq: Once | INTRAVENOUS | Status: DC

## 2017-12-29 MED ORDER — POLYETHYLENE GLYCOL 3350 17 G PO PACK: 17.0000 g | PACK | Freq: Every day | ORAL | Status: DC | PRN

## 2017-12-29 SURGICAL SUPPLY — 48 items
BIT DRILL SHORT 4.0 (BIT) IMPLANT
BLADE SURG 15 STRL LF DISP TIS (BLADE) ×1 IMPLANT
BLADE SURG 15 STRL SS (BLADE) ×3
BNDG GAUZE ELAST 4 BULKY (GAUZE/BANDAGES/DRESSINGS) ×3 IMPLANT
COVER PERINEAL POST (MISCELLANEOUS) ×3 IMPLANT
COVER SURGICAL LIGHT HANDLE (MISCELLANEOUS) ×3 IMPLANT
DRAPE STERI IOBAN 125X83 (DRAPES) ×3 IMPLANT
DRILL BIT SHORT 4.0 (BIT) ×3
DRSG MEPILEX BORDER 4X4 (GAUZE/BANDAGES/DRESSINGS) ×8 IMPLANT
DRSG MEPILEX BORDER 4X8 (GAUZE/BANDAGES/DRESSINGS) ×3 IMPLANT
DRSG PAD ABDOMINAL 8X10 ST (GAUZE/BANDAGES/DRESSINGS) ×6 IMPLANT
DURAPREP 26ML APPLICATOR (WOUND CARE) ×3 IMPLANT
ELECT REM PT RETURN 9FT ADLT (ELECTROSURGICAL) ×3
ELECTRODE REM PT RTRN 9FT ADLT (ELECTROSURGICAL) ×1 IMPLANT
FACESHIELD WRAPAROUND (MASK) ×3 IMPLANT
FACESHIELD WRAPAROUND OR TEAM (MASK) ×1 IMPLANT
GAUZE XEROFORM 5X9 LF (GAUZE/BANDAGES/DRESSINGS) ×3 IMPLANT
GLOVE BIO SURGEON STRL SZ8 (GLOVE) ×3 IMPLANT
GLOVE BIOGEL PI IND STRL 8 (GLOVE) ×1 IMPLANT
GLOVE BIOGEL PI INDICATOR 8 (GLOVE) ×2
GLOVE ORTHO TXT STRL SZ7.5 (GLOVE) ×3 IMPLANT
GOWN STRL REUS W/ TWL LRG LVL3 (GOWN DISPOSABLE) ×1 IMPLANT
GOWN STRL REUS W/ TWL XL LVL3 (GOWN DISPOSABLE) ×2 IMPLANT
GOWN STRL REUS W/TWL LRG LVL3 (GOWN DISPOSABLE) ×3
GOWN STRL REUS W/TWL XL LVL3 (GOWN DISPOSABLE) ×6
GUIDE PIN 3.2X343 (PIN) ×2
GUIDE PIN 3.2X343MM (PIN) ×6
KIT BASIN OR (CUSTOM PROCEDURE TRAY) ×3 IMPLANT
KIT TURNOVER KIT B (KITS) ×3 IMPLANT
LINER BOOT UNIVERSAL DISP (MISCELLANEOUS) ×3 IMPLANT
MANIFOLD NEPTUNE II (INSTRUMENTS) ×3 IMPLANT
NAIL INTERTAN LEFT 11.5X40 (Nail) ×2 IMPLANT
NS IRRIG 1000ML POUR BTL (IV SOLUTION) ×3 IMPLANT
PACK GENERAL/GYN (CUSTOM PROCEDURE TRAY) ×3 IMPLANT
PAD ARMBOARD 7.5X6 YLW CONV (MISCELLANEOUS) ×6 IMPLANT
PAD CAST 4YDX4 CTTN HI CHSV (CAST SUPPLIES) ×2 IMPLANT
PADDING CAST COTTON 4X4 STRL (CAST SUPPLIES) ×6
PIN GUIDE 3.2X343MM (PIN) IMPLANT
SCREW LAG COMPR KIT 100/95 (Screw) ×2 IMPLANT
SCREW TRIGEN LOW PROF 5.0X50 (Screw) ×2 IMPLANT
STAPLER VISISTAT 35W (STAPLE) ×3 IMPLANT
SUT VIC AB 0 CT1 27 (SUTURE) ×6
SUT VIC AB 0 CT1 27XBRD ANBCTR (SUTURE) ×2 IMPLANT
SUT VIC AB 2-0 CT1 27 (SUTURE) ×6
SUT VIC AB 2-0 CT1 TAPERPNT 27 (SUTURE) ×2 IMPLANT
TOWEL OR 17X24 6PK STRL BLUE (TOWEL DISPOSABLE) ×3 IMPLANT
TOWEL OR 17X26 10 PK STRL BLUE (TOWEL DISPOSABLE) ×3 IMPLANT
WATER STERILE IRR 1000ML POUR (IV SOLUTION) ×3 IMPLANT

## 2017-12-29 NOTE — Progress Notes (Signed)
PROGRESS NOTE    [name]  IRS:[id number] DOB: [birth year] DOA: 12/28/2017 PCP: Lemmie Evens, MD      Brief Narrative:  [name] is a 84 y.o. M with moderate dementia, community dwelling, uses walker, and CKD IV baseline Cr 2.0, and anemia as well as chart history of DM and HTN who presents with fall and hip pain, found to have left hip fracture.  Transferred from any plan for ORIF.   Assessment & Plan:  LEFT hip fracture -Pain medication per protocol -PT eval -Orthopedics consulted, uncomplicated ORIF today   Chronic kidney disease stage IV Baseline Cr >2.  Currently 1.8 -Trend BMP post-op  Anemai of chronic renal disease Transfused 2 units intraoperatively due to expected blood loss in surgery, starting Hgb 7.6 g/dl.  Inconsistent nursing reports overnight of ?red blood in stool.  -Transfusion threshold 7 g/dL -FOBT -Trend Hgb daily  Diabetes Chart history.  Not on anti-glycemics.  -Check HgbA1c  Hypertension Also chart history, not on anti-hypertensives.   -Hydralazine PRN For severe range pressures.     DVT prophylaxis: Heparin Code Status: FULL Family Communication: Daughter in Sports coach MDM and disposition Plan: The below labs and imaging reports were reviewed and summarized above.    The patient was admitted with hip fracture, went to the OR today.    Close moinitoring renal function and anemia.  Transfused 2 units intraoperatively.  Likely home to [facility] Wednesday   Consultants:   Ortho  Procedures:   ORIF 7/7  Antimicrobials:   None    Subjective: Sleepy.  Very hard of hearingl.  Per nursing, no more diarrhea.  No respriatory distress, no bleeding.  Objective: Vitals:   12/29/17 1241 12/29/17 1254 12/29/17 1309 12/29/17 1315  BP: (!) 156/76 (!) 143/61 (!) 132/58   Pulse: 95 85 71 81  Resp: (!) 22 14 14 15   Temp:    98 F (36.7 C)  TempSrc:      SpO2: 100% 100% 100% 100%  Weight:        Intake/Output Summary (Last 24 hours) at  12/29/2017 1607 Last data filed at 12/29/2017 1300 Gross per 24 hour  Intake 1482.5 ml  Output 50 ml  Net 1432.5 ml   Filed Weights   12/28/17 1817  Weight: 73 kg (161 lb)    Examination: General appearance: elderly adult male, sleeping in PACU, rousable but very HOH, no acute distress.   HEENT: Anicteric, conjunctiva pink, lids and lashes normal. No nasal deformity, discharge, epistaxis.  Lips moist, mostly edentulous, oP dry, no oral lesions, profoundly HOH.   Skin: Warm and dry.  no jaundice.  No suspicious rashes or lesions. Cardiac: RRR, nl S1-S2, no murmurs appreciated.  Capillary refill is brisk.  JVP not visible.  No LE edema.  Radia  pulses 2+ and symmetric. Respiratory: Normal respiratory rate and rhythm.  CTAB without rales or wheezes. Abdomen: Abdomen soft.  no TTP. No ascites, distension, hepatosplenomegaly.   MSK: No deformities or effusions. Neuro: Moves both upper extremities equally, makes eye contact, doesn't follow commands otherwise,.  Very HOH. Psych: Unable to assess.    Data Reviewed: I have personally reviewed following labs and imaging studies:  CBC: Recent Labs  Lab 12/28/17 1920  WBC 5.4  NEUTROABS 4.3  HGB 7.6*  HCT 24.4*  MCV 95.3  PLT 009*   Basic Metabolic Panel: Recent Labs  Lab 12/28/17 1920  NA 139  K 5.1  CL 111  CO2 23  GLUCOSE 127*  BUN 34*  CREATININE 1.75*  CALCIUM 8.4*   GFR: CrCl cannot be calculated (Unknown ideal weight.). Liver Function Tests: No results for input(s): AST, ALT, ALKPHOS, BILITOT, PROT, ALBUMIN in the last 168 hours. No results for input(s): LIPASE, AMYLASE in the last 168 hours. No results for input(s): AMMONIA in the last 168 hours. Coagulation Profile: Recent Labs  Lab 12/28/17 1920  INR 1.16   Cardiac Enzymes: No results for input(s): CKTOTAL, CKMB, CKMBINDEX, TROPONINI in the last 168 hours. BNP (last 3 results) No results for input(s): PROBNP in the last 8760 hours. HbA1C: No results for  input(s): HGBA1C in the last 72 hours. CBG: Recent Labs  Lab 12/28/17 2141 12/29/17 0835 12/29/17 1134  GLUCAP 119* 99 87   Lipid Profile: No results for input(s): CHOL, HDL, LDLCALC, TRIG, CHOLHDL, LDLDIRECT in the last 72 hours. Thyroid Function Tests: No results for input(s): TSH, T4TOTAL, FREET4, T3FREE, THYROIDAB in the last 72 hours. Anemia Panel: No results for input(s): VITAMINB12, FOLATE, FERRITIN, TIBC, IRON, RETICCTPCT in the last 72 hours. Urine analysis:    Component Value Date/Time   COLORURINE YELLOW 04/27/2017 0023   APPEARANCEUR CLOUDY (A) 04/27/2017 0023   LABSPEC 1.015 04/27/2017 0023   PHURINE 5.0 04/27/2017 0023   GLUCOSEU NEGATIVE 04/27/2017 0023   HGBUR NEGATIVE 04/27/2017 0023   BILIRUBINUR NEGATIVE 04/27/2017 0023   KETONESUR NEGATIVE 04/27/2017 0023   PROTEINUR 100 (A) 04/27/2017 0023   UROBILINOGEN 0.2 11/28/2012 1626   NITRITE NEGATIVE 04/27/2017 0023   LEUKOCYTESUR NEGATIVE 04/27/2017 0023   Sepsis Labs: @LABRCNTIP (procalcitonin:4,lacticacidven:4)  ) Recent Results (from the past 240 hour(s))  MRSA PCR Screening     Status: None   Collection Time: 12/29/17  5:32 AM  Result Value Ref Range Status   MRSA by PCR NEGATIVE NEGATIVE Final    Comment:        The GeneXpert MRSA Assay (FDA approved for NASAL specimens only), is one component of a comprehensive MRSA colonization surveillance program. It is not intended to diagnose MRSA infection nor to guide or monitor treatment for MRSA infections. Performed at [facility], [place] [address], [place],  [address]          Radiology Studies: Dg Chest 1 View  Result Date: 12/28/2017 CLINICAL DATA:  [place] outside today, LEFT hip pain, LEFT hip fracture EXAM: CHEST  1 VIEW COMPARISON:  04/2017 FINDINGS: Upper normal heart size with slight vascular congestion. Mediastinal contours normal. Bibasilar atelectasis and small LEFT pleural effusion. Upper lungs clear. No definite infiltrate  or pneumothorax. Bones demineralized with advanced degenerative changes of the RIGHT glenohumeral joint. IMPRESSION: Bibasilar atelectasis with small LEFT pleural effusion. Electronically Signed   By: Lavonia Dana M.D.   On: 12/28/2017 19:19   Dg C-arm 1-60 Min  Result Date: 12/29/2017 CLINICAL DATA:  Intraoperative imaging for fixation of a left hip fracture the patient suffered in a fall 12/28/2017. Initial encounter. EXAM: LEFT FEMUR 2 VIEWS; DG C-ARM 61-120 MIN COMPARISON:  Plain films left hip 12/28/2017. FINDINGS: Four fluoroscopic intraoperative spot views demonstrate placement of a long intramedullary nail with 2 screws across a comminuted intertrochanteric fracture and a single distal screw. Position and alignment are improved. No acute abnormality. IMPRESSION: Intraoperative imaging for fixation of a left intertrochanteric fracture. Electronically Signed   By: Inge Rise M.D.   On: 12/29/2017 11:58   Dg C-arm 1-60 Min  Result Date: 12/29/2017 CLINICAL DATA:  Intraoperative imaging for fixation of a left hip fracture the patient suffered in a fall 12/28/2017.  Initial encounter. EXAM: LEFT FEMUR 2 VIEWS; DG C-ARM 61-120 MIN COMPARISON:  Plain films left hip 12/28/2017. FINDINGS: Four fluoroscopic intraoperative spot views demonstrate placement of a long intramedullary nail with 2 screws across a comminuted intertrochanteric fracture and a single distal screw. Position and alignment are improved. No acute abnormality. IMPRESSION: Intraoperative imaging for fixation of a left intertrochanteric fracture. Electronically Signed   By: Inge Rise M.D.   On: 12/29/2017 11:58   Dg Hip Unilat With Pelvis 2-3 Views Left  Result Date: 12/28/2017 CLINICAL DATA:  LEFT hip thigh pain post fall outside today EXAM: DG HIP (WITH OR WITHOUT PELVIS) 2-3V LEFT COMPARISON:  None FINDINGS: Marked osseous demineralization. Comminuted displaced intertrochanteric fracture LEFT femur. No dislocation. Hip and SI  joints preserved. BILATERAL pelvic surgical clips. IMPRESSION: Displaced comminuted intertrochanteric fracture LEFT femur. Electronically Signed   By: Lavonia Dana M.D.   On: 12/28/2017 19:17   Dg Femur Min 2 Views Left  Result Date: 12/29/2017 CLINICAL DATA:  Intraoperative imaging for fixation of a left hip fracture the patient suffered in a fall 12/28/2017. Initial encounter. EXAM: LEFT FEMUR 2 VIEWS; DG C-ARM 61-120 MIN COMPARISON:  Plain films left hip 12/28/2017. FINDINGS: Four fluoroscopic intraoperative spot views demonstrate placement of a long intramedullary nail with 2 screws across a comminuted intertrochanteric fracture and a single distal screw. Position and alignment are improved. No acute abnormality. IMPRESSION: Intraoperative imaging for fixation of a left intertrochanteric fracture. Electronically Signed   By: Inge Rise M.D.   On: 12/29/2017 11:58   Dg Femur Min 2 Views Left  Result Date: 12/28/2017 CLINICAL DATA:  [place] outside today, LEFT hip and thigh pain EXAM: LEFT FEMUR 2 VIEWS COMPARISON:  None FINDINGS: Hip imaged and reported separately. The mid to distal LEFT femur appears diffusely demineralized. No fracture of the mid to distal LEFT femur identified. Knee joint alignments normal with degenerative changes at the medial compartment. No knee joint effusion. Atherosclerotic calcifications at distal superficial femoral and popliteal arteries. IMPRESSION: No acute mid to distal LEFT femoral abnormalities. Please see dedicated LEFT hip radiographic exam for discussion of comminuted displaced intertrochanteric fracture. Electronically Signed   By: Lavonia Dana M.D.   On: 12/28/2017 19:18        Scheduled Meds: . sodium chloride   Intravenous Once  . [START ON 12/30/2017] aspirin EC  325 mg Oral Q breakfast  . docusate sodium  100 mg Oral BID  . heparin  5,000 Units Subcutaneous Q8H   Continuous Infusions: . sodium chloride 75 mL/hr (12/29/17 0618)  .  ceFAZolin (ANCEF)  IV    . methocarbamol (ROBAXIN)  IV       LOS: 1 day    Time spent: 25 minutes    Edwin Dada, MD [facility] 12/29/2017, 4:07 PM     Pager [phone] --- please page though AMION:  [email or web address] Password [email or web address] If 7PM-7AM, please contact night-coverage
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
## 2017-12-29 NOTE — Anesthesia Postprocedure Evaluation (Signed)
Anesthesia Post Note  Patient: [name]  Procedure(s) Performed: OPEN REDUCTION INTERNAL FIXATION (ORIF) INTERTROCH HIP FRACTURE, LEFT (Left Hip)     Patient location during evaluation: PACU Anesthesia Type: MAC Level of consciousness: oriented and awake and alert Pain management: pain level controlled Vital Signs Assessment: post-procedure vital signs reviewed and stable Respiratory status: spontaneous breathing, respiratory function stable and patient connected to nasal cannula oxygen Cardiovascular status: blood pressure returned to baseline and stable Postop Assessment: no headache, no backache and no apparent nausea or vomiting Anesthetic complications: no    Last Vitals:  Vitals:   12/29/17 1309 12/29/17 1315  BP: (!) 132/58   Pulse: 71 81  Resp: 14 15  Temp:  36.7 C  SpO2: 100% 100%    Last Pain:  Vitals:   12/29/17 0730  TempSrc:   PainSc: 0-No pain                 Danny Zimny COKER

## 2017-12-29 NOTE — Brief Op Note (Signed)
12/28/2017 - 12/29/2017  11:11 AM  PATIENT:  [name]  84 y.o. male  PRE-OPERATIVE DIAGNOSIS:  LEFT INTERTROCH HIP FRACTURE  POST-OPERATIVE DIAGNOSIS:  LEFT INTERTROCH HIP FRACTURE  PROCEDURE:  Procedure(s): OPEN REDUCTION INTERNAL FIXATION (ORIF) INTERTROCH HIP FRACTURE, LEFT (Left)  SURGEON:  Surgeon(s) and Role:    * Mcarthur Rossetti, MD - Primary  PHYSICIAN ASSISTANT: Benita Stabile, PA-C  ANESTHESIA:   spinal  EBL:  50 mL   COUNTS:  YES  DICTATION: .Other Dictation: Dictation Number [id number]  PLAN OF CARE: Admit to inpatient   PATIENT DISPOSITION:  PACU - hemodynamically stable.   Delay start of Pharmacological VTE agent (>24hrs) due to surgical blood loss or risk of bleeding: no

## 2017-12-29 NOTE — Anesthesia Preprocedure Evaluation (Signed)
Anesthesia Evaluation  Patient identified by MRN, date of birth, ID band Patient awake    Reviewed: Allergy & Precautions, NPO status , Patient's Chart, lab work & pertinent test results  Airway Mallampati: II  TM Distance: >3 FB     Dental  (+) Edentulous Upper, Edentulous Lower   Pulmonary    breath sounds clear to auscultation       Cardiovascular  Rhythm:Regular Rate:Normal     Neuro/Psych    GI/Hepatic   Endo/Other  diabetes  Renal/GU      Musculoskeletal   Abdominal   Peds  Hematology   Anesthesia Other Findings   Reproductive/Obstetrics                             Anesthesia Physical Anesthesia Plan  ASA: III  Anesthesia Plan: Spinal   Post-op Pain Management:    Induction:   PONV Risk Score and Plan: Ondansetron and Propofol infusion  Airway Management Planned: Simple Face Mask and Natural Airway  Additional Equipment:   Intra-op Plan:   Post-operative Plan:   Informed Consent: I have reviewed the patients History and Physical, chart, labs and discussed the procedure including the risks, benefits and alternatives for the proposed anesthesia with the patient or authorized representative who has indicated his/her understanding and acceptance.     Plan Discussed with: CRNA and Anesthesiologist  Anesthesia Plan Comments:         Anesthesia Quick Evaluation

## 2017-12-29 NOTE — Anesthesia Procedure Notes (Signed)
Spinal  Start time: 12/29/2017 10:00 AM End time: 12/29/2017 10:05 AM Staffing Anesthesiologist: Roberts Gaudy, MD Performed: anesthesiologist  Preanesthetic Checklist Completed: patient identified, site marked, surgical consent, pre-op evaluation, timeout performed, IV checked, risks and benefits discussed and monitors and equipment checked Spinal Block Patient position: left lateral decubitus Prep: ChloraPrep Patient monitoring: heart rate, cardiac monitor, continuous pulse ox and blood pressure Approach: midline Location: L3-4 Injection technique: single-shot Needle Needle type: Tuohy  Needle gauge: 22 G Needle length: 9 cm Needle insertion depth: 5 cm Assessment Sensory level: T6 Additional Notes Clear CSF  12 mg 0.75% Bupivacaine injected easily

## 2017-12-29 NOTE — Progress Notes (Signed)
Patient ID: [name], male   DOB: 01/02/1927, 84 y.o.   MRN: [id number] The patient presents this am for surgery to address his left hip fracture.  The surgery has been explained to his family in detail.  Risks and benefits were discussed and informed consent is obtained.

## 2017-12-29 NOTE — Transfer of Care (Signed)
Immediate Anesthesia Transfer of Care Note  Patient: Roque Cash Yazdi  Procedure(s) Performed: OPEN REDUCTION INTERNAL FIXATION (ORIF) INTERTROCH HIP FRACTURE, LEFT (Left Hip)  Patient Location: PACU  Anesthesia Type:MAC and Spinal  Level of Consciousness: awake  Airway & Oxygen Therapy: Patient Spontanous Breathing and Patient connected to nasal cannula oxygen  Post-op Assessment: Report given to RN and Post -op Vital signs reviewed and stable  Post vital signs: Reviewed and stable  Last Vitals:  Vitals Value Taken Time  BP 130/69 12/29/2017 11:33 AM  Temp    Pulse    Resp 16 12/29/2017 11:34 AM  SpO2    Vitals shown include unvalidated device data.  Last Pain:  Vitals:   12/29/17 0444  TempSrc: Oral  PainSc:          Complications: No apparent anesthesia complications

## 2017-12-29 NOTE — Op Note (Signed)
NAME: [name], [place] RECORD WO:[id number] ACCOUNT [id number] DATE OF BIRTH:1926/08/26 FACILITY: [facility] LOCATION: [facility] PHYSICIAN:Shakiera Edelson Kerry Fort, MD  OPERATIVE REPORT  DATE OF PROCEDURE:  12/28/2017  PREOPERATIVE DIAGNOSIS:  Left comminuted intertrochanteric hip fracture.  POSTOPERATIVE DIAGNOSIS:  Left comminuted intertrochanteric hip fracture.  PROCEDURE:  Open reduction and fixation of left comminuted complex intertrochanteric hip fracture.  IMPLANTS:  [employer] InterTAN femoral nail measuring 11.5 x 38 cm with a 100 -- 95 mm lag/compression screw construct and 1 distal interlocking screw.  SURGEON:  Jean Rosenthal, MD  ASSISTANT:  Erskine Emery, PA-C.  ANESTHESIA:  Spinal.  ANTIBIOTICS:  Two grams IV Ancef.  ESTIMATED BLOOD LOSS:  50 mL.  COMPLICATIONS:  None.  INDICATIONS:  The patient is a frail 84 year old gentleman with some dementia who sustained an accidental mechanical fall yesterday when he missed the chair on his family's porch.  He was seen in an outlying hospital and transferred to [facility] for  definitive care of a complex left hip fracture.  He was admitted to the medicine service and cleared for surgery today.  He does already have acute blood loss anemia and he will likely receive some blood during today.  I talked to the family members last  night in detail about the surgery and informed consent was obtained.    DESCRIPTION OF PROCEDURE:   After informed consent was obtained and appropriate left hip was marked, he was brought to the operating room where spinal anesthesia was obtained while he was on the stretcher.  He was then laid in a supine position on the  stretcher and moved onto the fracture table with a perineal post in place on his left operative leg in in-line skeletal traction with adduction.  His right nonoperative hip was placed and a well-leg holder with abduction, flexion and protection of the  popliteal area  with appropriate padding.  We then assessed the fracture under direct fluoroscopy and reduced it as best we could.  We chose our rod keeping it sterile within the box, choosing an 11.5 x 38 mm rod.  We then prepped his left hip, thigh, leg  and passed the knee with DuraPrep and sterile drapes.  A time-out was called to identify correct patient, correct left hip.  We then made an incision just proximal to the greater trochanter along the lateral aspect of his hip.  I placed a temporary pin  in an antegrade fashion from the tip of the greater trochanter to past the fracture into the shaft.  We verified the placement under direct fluoroscopy and then using initiating reamer to open up the femoral canal.  We then were able to easily pass our  chosen rod which was 11.5 cm x 38 cm down the femoral canal.  Using the outrigger guide, we then made a separate lateral incision and placed guide pins into the femoral head in appropriate bone quality area.  We took measurements off of that and then  drilled to my depth of our 100 mm - 95 mm lag -- compression screw construct.  We placed these without difficulty and was able to compress the fracture some.  We then placed a single distal interlocking screw through a separate stab incision from lateral  to medial.  We then removed all instrumentation and put the hip through internal and external rotation and it moved as a unit.  We irrigated the soft tissues with normal saline solution and closed the deep tissue with 0 Vicryl followed by  2-0 Vicryl  subcutaneous tissue and interrupted staples on the skin.  A well-padded sterile dressing was applied.  He was taken to recovery room in stable condition.  All final counts were correct.  There were no complications noted.  Of note, Benita Stabile, PA-C,  assisted the entire case.  His assistance was crucial for facilitating all aspects of this case.  AN/NUANCE  D:12/29/2017 T:12/29/2017 JOB:[id number]

## 2017-12-30 ENCOUNTER — Encounter (HOSPITAL_COMMUNITY): Payer: Self-pay | Admitting: Orthopaedic Surgery

## 2017-12-30 ENCOUNTER — Other Ambulatory Visit: Payer: Self-pay

## 2017-12-30 LAB — COMPREHENSIVE METABOLIC PANEL
ALBUMIN: 2.6 g/dL — AB (ref 3.5–5.0)
ALK PHOS: 53 U/L (ref 38–126)
ALT: 9 U/L (ref 0–44)
AST: 22 U/L (ref 15–41)
Anion gap: 4 — ABNORMAL LOW (ref 5–15)
BILIRUBIN TOTAL: 0.7 mg/dL (ref 0.3–1.2)
BUN: 37 mg/dL — ABNORMAL HIGH (ref 8–23)
CO2: 25 mmol/L (ref 22–32)
Calcium: 8.2 mg/dL — ABNORMAL LOW (ref 8.9–10.3)
Chloride: 115 mmol/L — ABNORMAL HIGH (ref 98–111)
Creatinine, Ser: 2.37 mg/dL — ABNORMAL HIGH (ref 0.61–1.24)
GFR calc Af Amer: 26 mL/min — ABNORMAL LOW (ref >60)
GFR calc non Af Amer: 23 mL/min — ABNORMAL LOW (ref >60)
GLUCOSE: 136 mg/dL — AB (ref 70–99)
Potassium: 5.5 mmol/L — ABNORMAL HIGH (ref 3.5–5.1)
Sodium: 144 mmol/L (ref 135–145)
TOTAL PROTEIN: 5.2 g/dL — AB (ref 6.5–8.1)

## 2017-12-30 LAB — CBC
HCT: 24.1 % — ABNORMAL LOW (ref 39.0–52.0)
Hemoglobin: 7.6 g/dL — ABNORMAL LOW (ref 13.0–17.0)
MCH: 29.8 pg (ref 26.0–34.0)
MCHC: 31.5 g/dL (ref 30.0–36.0)
MCV: 94.5 fL (ref 78.0–100.0)
PLATELETS: 94 10*3/uL — AB (ref 150–400)
RBC: 2.55 MIL/uL — AB (ref 4.22–5.81)
RDW: 14.3 % (ref 11.5–15.5)
WBC: 7.2 10*3/uL (ref 4.0–10.5)

## 2017-12-30 LAB — HEMOGLOBIN A1C
Hgb A1c MFr Bld: 5.5 % (ref 4.8–5.6)
Mean Plasma Glucose: 111.15 mg/dL

## 2017-12-30 MED ORDER — SODIUM CHLORIDE 0.9 % IV BOLUS: 500.0000 mL | Freq: Once | INTRAVENOUS | Status: DC

## 2017-12-30 NOTE — Evaluation (Signed)
Physical Therapy Evaluation Patient Details Name: [name] MRN: [id number] DOB: 16-Oct-1926 Today's Date: 12/30/2017   History of Present Illness  Pt is an 84 y/o male who presents s/p mechanical fall at home. Pt was attempting to sit in a desk chair, but sat on the arm rest causing the chair to rotate and pt to fall. He sustained a L comminuted intertrochanteric hip fx now s/p ORIF on 12/29/17.  PMH significant for renal insuffiency, DM, dementia, Barrett's esophagus.  Clinical Impression  Pt admitted with above diagnosis. Pt currently with functional limitations due to the deficits listed below (see PT Problem List). At the time of PT eval pt was able to perform transfers with +2 max assist and RW for support. Pt with baseline dementia and is HOH which made communication difficult at times. Unable to get a true sense of PLOF or home set up/available family support. Based on performance today feel that follow-up therapies at the SNF level would be most appropriate, to maximize functional independence and return to PLOF (which was possibly as high as independent with RW). Pt will benefit from skilled PT to increase their independence and safety with mobility to allow discharge to the venue listed below.       Follow Up Recommendations SNF;Supervision/Assistance - 24 hour    Equipment Recommendations  Other (comment)(TBD by next venue of care)    Recommendations for Other Services       Precautions / Restrictions Precautions Precautions: Fall Restrictions Weight Bearing Restrictions: Yes LLE Weight Bearing: Weight bearing as tolerated      Mobility  Bed Mobility Overal bed mobility: Needs Assistance Bed Mobility: Supine to Sit     Supine to sit: Max assist;+2 for physical assistance;HOB elevated     General bed mobility comments: VC's for sequencing. Pt was encouraged to reach for railings for UE support/assist. Therapists assisted with LE movement, scooting with bed pad, and  trunk elevation to full sitting position.   Transfers Overall transfer level: Needs assistance Equipment used: Rolling walker (2 wheeled) Transfers: Sit to/from [facility] Sit to Stand: Max assist;+2 physical assistance;From elevated surface Stand pivot transfers: Max assist;+2 physical assistance;From elevated surface       General transfer comment: VC's for sequencing and general safety. Pt was able to power-up to full stand with +2 assist for balance support. Pt generally flexed in trunk during transfer and had difficulty raising head to improve posture. Pt was able to take 1 step around towards the chair and slid the LLE back as well, but was unable to tolerate further mobility and pt was assisted to the chair the rest of the way.   Ambulation/Gait             General Gait Details: Unable to attempt  Stairs            Wheelchair Mobility    Modified Rankin (Stroke Patients Only)       Balance Overall balance assessment: Needs assistance Sitting-balance support: Feet supported;Single extremity supported Sitting balance-Leahy Scale: Fair Sitting balance - Comments: Statically pt was able to maintain sitting EOB without assist - min guard provided as pt did demonstrate a  R lateral lean to off-weight painful L hip.  Postural control: Right lateral lean Standing balance support: Bilateral upper extremity supported;During functional activity Standing balance-Leahy Scale: Zero Standing balance comment: +2 required  Pertinent Vitals/Pain Pain Assessment: Faces Faces Pain Scale: Hurts whole lot Pain Location: LLE with movement. When asked about pain at rest pt states he has no pain.  Pain Descriptors / Indicators: Operative site guarding;Moaning Pain Intervention(s): Limited activity within patient's tolerance;Monitored during session;Repositioned    Home Living Family/patient expects to be discharged to::  Private residence                 Additional Comments: Pt is a poor historian. Initially reports he lives alone and then reports he lives with a son. Per details from prior admission pt was living with children in a one level home and ramped entrance. Pt does state he uses a RW for mobility. Unsure how accurate these details still are.     Prior Function           Comments: Unable to provide details of PLOF.      Hand Dominance        Extremity/Trunk Assessment   Upper Extremity Assessment Upper Extremity Assessment: Defer to OT evaluation    Lower Extremity Assessment Lower Extremity Assessment: LLE deficits/detail LLE Deficits / Details: Acute pain, decreased strength and AROM consistent with above mentioned injury and subsequent procedure.     Cervical / Trunk Assessment Cervical / Trunk Assessment: Other exceptions Cervical / Trunk Exceptions: Forward head posture with rounded shoulders  Communication   Communication: HOH(Significant hearing loss)  Cognition Arousal/Alertness: Awake/alert Behavior During Therapy: WFL for tasks assessed/performed Overall Cognitive Status: History of cognitive impairments - at baseline                                        General Comments      Exercises     Assessment/Plan    PT Assessment Patient needs continued PT services  PT Problem List Decreased strength;Decreased range of motion;Decreased activity tolerance;Decreased balance;Decreased mobility;Decreased knowledge of use of DME;Decreased safety awareness;Decreased knowledge of precautions;Pain       PT Treatment Interventions DME instruction;Gait training;Functional mobility training;Therapeutic activities;Therapeutic exercise;Neuromuscular re-education;Patient/family education    PT Goals (Current goals can be found in the Care Plan section)  Acute Rehab PT Goals Patient Stated Goal: Pt did not state goals PT Goal Formulation: Patient unable  to participate in goal setting Time For Goal Achievement: 01/13/18 Potential to Achieve Goals: Good    Frequency Min 3X/week   Barriers to discharge        Co-evaluation PT/OT/SLP Co-Evaluation/Treatment: Yes Reason for Co-Treatment: Necessary to address cognition/behavior during functional activity;To address functional/ADL transfers;For patient/therapist safety PT goals addressed during session: Mobility/safety with mobility;Balance;Proper use of DME         AM-PAC PT "6 Clicks" Daily Activity  Outcome Measure Difficulty turning over in bed (including adjusting bedclothes, sheets and blankets)?: Unable Difficulty moving from lying on back to sitting on the side of the bed? : Unable Difficulty sitting down on and standing up from a chair with arms (e.g., wheelchair, bedside commode, etc,.)?: Unable Help needed moving to and from a bed to chair (including a wheelchair)?: Total Help needed walking in hospital room?: Total Help needed climbing 3-5 steps with a railing? : Total 6 Click Score: 6    End of Session Equipment Utilized During Treatment: Gait belt Activity Tolerance: Patient limited by pain Patient left: in chair;with call bell/phone within reach;with chair alarm set Nurse Communication: Mobility status PT Visit Diagnosis: Unsteadiness on feet (R26.81);Pain;Difficulty  in walking, not elsewhere classified (R26.2);History of falling (Z91.81) Pain - Right/Left: Left Pain - part of body: Leg;Hip    Time: 0479-9872 PT Time Calculation (min) (ACUTE ONLY): 27 min   Charges:   PT Evaluation $PT Eval Moderate Complexity: 1 Mod     PT G Codes:        Rolinda Roan, PT, DPT Acute Rehabilitation Services Pager: [phone]   Thelma Comp 12/30/2017, 10:00 AM

## 2017-12-30 NOTE — Progress Notes (Addendum)
PROGRESS NOTE    [name]  ZDG:[id number] DOB: 22-May-1927 DOA: 12/28/2017 PCP: Lemmie Evens, MD      Brief Narrative:  Mr. Vanessen is a 84 y.o. M with moderate dementia, community dwelling, uses walker, and CKD IV baseline Cr 2.0, and anemia as well as chart history of DM and HTN who presents with fall and hip pain, found to have left hip fracture.  Transferred from [facility] for ORIF.       Assessment & Plan:  LEFT hip fracture -Pain medication per protocol -PT eval -Orthopedics consulted, uncomplicated ORIF today -PT recommend SNF -DVT ppx with Aspirin   Chronic kidney disease stage IV Hyperkalemia Baseline Cr >2.  1.8 at admission, today 2.37, within baseline range.  K up to 5.5 today. -IV fluids overnight -Trend BMP  Anemia of chronic renal insufficiency Transfused 2 units intraoperatively due to expected blood loss in surgery, starting Hgb 7.6 g/dl.  No noted blood in stool here.  Hgb today 7.6 g/dL.  -Transfusion threshold 7 g/dL -Obtain FOBT given questionable report of blood in stool before surgery -Trend Hgb daily  History of Diabetes Chart history.  Not on anti-glycemics. A1c normal -Check HgbA1c  Hypertension Also chart history, not on anti-hypertensives.   -Hydralazine PRN For severe range pressures.  Apparent acute encephalopathy Etiology unclear.  Patient has profound dementia and deafness.  It is unclear how close he is to his baseline mentation, but appears to me confused and agitated, due to pain and recent surgery and medication, ie metabolic encephalopathy        DVT prophylaxis: Heparin and aspirin Code Status: FULL Family Communication: None present MDM and disposition Plan: Absent imaging reports were reviewed and summarized above.  The patient was admitted with hip fracture, went to the OR on 7/11.  His renal function is stable postoperatively.  He was transfused 2 units intraoperatively, hemoglobin is stable today.  There was  a question initially of rectal bleeding, but he has had no bowel movements no bleeding since then.  Likely to [facility] Wednesday.      Consultants:   Orthopedics  Procedures:   ORIF 7/7  Antimicrobials:   None    Subjective: Very hard of hearing.  Oriented to hospital.  Otherwise unable to answer questions, make needs known.  No respiratory distress per nursing.  No fever, cough, complaints of pain except in the left hip.  Objective: Vitals:   12/29/17 1315 12/29/17 2012 12/30/17 0440 12/30/17 1505  BP:  (!) 159/60 (!) 167/60 (!) 161/55  Pulse: 81 92 96 85  Resp: 15 16    Temp: 98 F (36.7 C) 99.1 F (37.3 C) (!) 101.1 F (38.4 C) 98.2 F (36.8 C)  TempSrc:  Oral Oral Oral  SpO2: 100% 100% 95% 96%  Weight:        Intake/Output Summary (Last 24 hours) at 12/30/2017 1722 Last data filed at 12/30/2017 1300 Gross per 24 hour  Intake 1982.5 ml  Output 1000 ml  Net 982.5 ml   Filed Weights   12/28/17 1817  Weight: 73 kg (161 lb)    Examination: General appearance: Thin elderly male, lying in bed, alert to me.  No acute distress. HEENT: Anicteric, conjunctive are pink, lids and lashes normal.  No nasal deformity, discharge, or epistaxis.  Lips moist, mostly edentulous.  Oropharynx moist, no oral lesions.  Profoundly hard of hearing. Skin: Dry, no jaundice, suspicious rashes, or lesions. Cardiac: RRR, no murmurs, 1+ bilateral lower extremity edema, pitting. Respiratory:  Normal respiratory effort, lungs clear without rales or wheezes. Abdomen: Abdomen soft without tenderness to palpation, ascites, or distention. MSK: No deformities or effusions of the large joints of the upper or lower extremities bilaterally.  Diffuse loss of subcutaneous muscle mass and fat. Neuro: Moves both upper extremities equally, makes eye contact, very hard of hearing, cannot follow commands. Psych: Likely dementia, unable to assess.    Data Reviewed: I have personally reviewed following labs and  imaging studies:  CBC: Recent Labs  Lab 12/28/17 1920 12/30/17 0429  WBC 5.4 7.2  NEUTROABS 4.3  --   HGB 7.6* 7.6*  HCT 24.4* 24.1*  MCV 95.3 94.5  PLT 133* 94*   Basic Metabolic Panel: Recent Labs  Lab 12/28/17 1920 12/30/17 0429  NA 139 144  K 5.1 5.5*  CL 111 115*  CO2 23 25  GLUCOSE 127* 136*  BUN 34* 37*  CREATININE 1.75* 2.37*  CALCIUM 8.4* 8.2*   GFR: CrCl cannot be calculated (Unknown ideal weight.). Liver Function Tests: Recent Labs  Lab 12/30/17 0429  AST 22  ALT 9  ALKPHOS 53  BILITOT 0.7  PROT 5.2*  ALBUMIN 2.6*   No results for input(s): LIPASE, AMYLASE in the last 168 hours. No results for input(s): AMMONIA in the last 168 hours. Coagulation Profile: Recent Labs  Lab 12/28/17 1920  INR 1.16   Cardiac Enzymes: No results for input(s): CKTOTAL, CKMB, CKMBINDEX, TROPONINI in the last 168 hours. BNP (last 3 results) No results for input(s): PROBNP in the last 8760 hours. HbA1C: Recent Labs    12/30/17 0429  HGBA1C 5.5   CBG: Recent Labs  Lab 12/28/17 2141 12/29/17 0835 12/29/17 1134 12/29/17 1640  GLUCAP 119* 99 87 121*   Lipid Profile: No results for input(s): CHOL, HDL, LDLCALC, TRIG, CHOLHDL, LDLDIRECT in the last 72 hours. Thyroid Function Tests: No results for input(s): TSH, T4TOTAL, FREET4, T3FREE, THYROIDAB in the last 72 hours. Anemia Panel: No results for input(s): VITAMINB12, FOLATE, FERRITIN, TIBC, IRON, RETICCTPCT in the last 72 hours. Urine analysis:    Component Value Date/Time   COLORURINE YELLOW 04/27/2017 0023   APPEARANCEUR CLOUDY (A) 04/27/2017 0023   LABSPEC 1.015 04/27/2017 0023   PHURINE 5.0 04/27/2017 0023   GLUCOSEU NEGATIVE 04/27/2017 0023   HGBUR NEGATIVE 04/27/2017 0023   BILIRUBINUR NEGATIVE 04/27/2017 0023   KETONESUR NEGATIVE 04/27/2017 0023   PROTEINUR 100 (A) 04/27/2017 0023   UROBILINOGEN 0.2 11/28/2012 1626   NITRITE NEGATIVE 04/27/2017 0023   LEUKOCYTESUR NEGATIVE 04/27/2017 0023    Sepsis Labs: @LABRCNTIP (procalcitonin:4,lacticacidven:4)  ) Recent Results (from the past 240 hour(s))  MRSA PCR Screening     Status: None   Collection Time: 12/29/17  5:32 AM  Result Value Ref Range Status   MRSA by PCR NEGATIVE NEGATIVE Final    Comment:        The GeneXpert MRSA Assay (FDA approved for NASAL specimens only), is one component of a comprehensive MRSA colonization surveillance program. It is not intended to diagnose MRSA infection nor to guide or monitor treatment for MRSA infections. Performed at [facility], [place] [address], [place], [place] [address]          Radiology Studies: Dg Chest 1 View  Result Date: 12/28/2017 CLINICAL DATA:  [place] outside today, LEFT hip pain, LEFT hip fracture EXAM: CHEST  1 VIEW COMPARISON:  04/2017 FINDINGS: Upper normal heart size with slight vascular congestion. Mediastinal contours normal. Bibasilar atelectasis and small LEFT pleural effusion. Upper lungs clear.  No definite infiltrate or pneumothorax. Bones demineralized with advanced degenerative changes of the RIGHT glenohumeral joint. IMPRESSION: Bibasilar atelectasis with small LEFT pleural effusion. Electronically Signed   By: Lavonia Dana M.D.   On: 12/28/2017 19:19   Dg C-arm 1-60 Min  Result Date: 12/29/2017 CLINICAL DATA:  Intraoperative imaging for fixation of a left hip fracture the patient suffered in a fall 12/28/2017. Initial encounter. EXAM: LEFT FEMUR 2 VIEWS; DG C-ARM 61-120 MIN COMPARISON:  Plain films left hip 12/28/2017. FINDINGS: Four fluoroscopic intraoperative spot views demonstrate placement of a long intramedullary nail with 2 screws across a comminuted intertrochanteric fracture and a single distal screw. Position and alignment are improved. No acute abnormality. IMPRESSION: Intraoperative imaging for fixation of a left intertrochanteric fracture. Electronically Signed   By: Inge Rise M.D.   On: 12/29/2017 11:58   Dg C-arm 1-60  Min  Result Date: 12/29/2017 CLINICAL DATA:  Intraoperative imaging for fixation of a left hip fracture the patient suffered in a fall 12/28/2017. Initial encounter. EXAM: LEFT FEMUR 2 VIEWS; DG C-ARM 61-120 MIN COMPARISON:  Plain films left hip 12/28/2017. FINDINGS: Four fluoroscopic intraoperative spot views demonstrate placement of a long intramedullary nail with 2 screws across a comminuted intertrochanteric fracture and a single distal screw. Position and alignment are improved. No acute abnormality. IMPRESSION: Intraoperative imaging for fixation of a left intertrochanteric fracture. Electronically Signed   By: Inge Rise M.D.   On: 12/29/2017 11:58   Dg Hip Unilat With Pelvis 2-3 Views Left  Result Date: 12/28/2017 CLINICAL DATA:  LEFT hip thigh pain post fall outside today EXAM: DG HIP (WITH OR WITHOUT PELVIS) 2-3V LEFT COMPARISON:  None FINDINGS: Marked osseous demineralization. Comminuted displaced intertrochanteric fracture LEFT femur. No dislocation. Hip and SI joints preserved. BILATERAL pelvic surgical clips. IMPRESSION: Displaced comminuted intertrochanteric fracture LEFT femur. Electronically Signed   By: Lavonia Dana M.D.   On: 12/28/2017 19:17   Dg Femur Min 2 Views Left  Result Date: 12/29/2017 CLINICAL DATA:  Intraoperative imaging for fixation of a left hip fracture the patient suffered in a fall 12/28/2017. Initial encounter. EXAM: LEFT FEMUR 2 VIEWS; DG C-ARM 61-120 MIN COMPARISON:  Plain films left hip 12/28/2017. FINDINGS: Four fluoroscopic intraoperative spot views demonstrate placement of a long intramedullary nail with 2 screws across a comminuted intertrochanteric fracture and a single distal screw. Position and alignment are improved. No acute abnormality. IMPRESSION: Intraoperative imaging for fixation of a left intertrochanteric fracture. Electronically Signed   By: Inge Rise M.D.   On: 12/29/2017 11:58   Dg Femur Min 2 Views Left  Result Date:  12/28/2017 CLINICAL DATA:  [place] outside today, LEFT hip and thigh pain EXAM: LEFT FEMUR 2 VIEWS COMPARISON:  None FINDINGS: Hip imaged and reported separately. The mid to distal LEFT femur appears diffusely demineralized. No fracture of the mid to distal LEFT femur identified. Knee joint alignments normal with degenerative changes at the medial compartment. No knee joint effusion. Atherosclerotic calcifications at distal superficial femoral and popliteal arteries. IMPRESSION: No acute mid to distal LEFT femoral abnormalities. Please see dedicated LEFT hip radiographic exam for discussion of comminuted displaced intertrochanteric fracture. Electronically Signed   By: Lavonia Dana M.D.   On: 12/28/2017 19:18        Scheduled Meds: . sodium chloride   Intravenous Once  . aspirin EC  325 mg Oral Q breakfast  . docusate sodium  100 mg Oral BID  . heparin  5,000 Units Subcutaneous Q8H   Continuous Infusions: .  sodium chloride 75 mL/hr at 12/29/17 1708  . methocarbamol (ROBAXIN)  IV    . sodium chloride       LOS: 2 days    Time spent: 25 minutes    Edwin Dada, MD [facility] 12/30/2017, 5:22 PM     Pager [phone] --- please page though AMION:  [email or web address] If 7PM-7AM, please contact night-coverage

## 2017-12-30 NOTE — Progress Notes (Signed)
Subjective: 1 Day Post-Op Procedure(s) (LRB): OPEN REDUCTION INTERNAL FIXATION (ORIF) INTERTROCH HIP FRACTURE, LEFT (Left) Patient reports pain as moderate.  Acute on chronic blood loss anemia.  Objective: Vital signs in last 24 hours: Temp:  [98 F (36.7 C)-101.1 F (38.4 C)] 101.1 F (38.4 C) (07/08 0440) Pulse Rate:  [71-96] 96 (07/08 0440) Resp:  [14-22] 16 (07/07 2012) BP: (132-167)/(58-76) 167/60 (07/08 0440) SpO2:  [95 %-100 %] 95 % (07/08 0440)  Intake/Output from previous day: 07/07 0701 - 07/08 0700 In: 3222.5 [I.V.:2342.5; Blood:630; IV Piggyback:100] Out: 50 [Blood:50] Intake/Output this shift: No intake/output data recorded.  Recent Labs    12/28/17 1920 12/30/17 0429  HGB 7.6* 7.6*   Recent Labs    12/28/17 1920 12/30/17 0429  WBC 5.4 7.2  RBC 2.56* 2.55*  HCT 24.4* 24.1*  PLT 133* 94*   Recent Labs    12/28/17 1920 12/30/17 0429  NA 139 144  K 5.1 5.5*  CL 111 115*  CO2 23 25  BUN 34* 37*  CREATININE 1.75* 2.37*  GLUCOSE 127* 136*  CALCIUM 8.4* 8.2*   Recent Labs    12/28/17 1920  INR 1.16    Sensation intact distally Intact pulses distally Dorsiflexion/Plantar flexion intact Incision: moderate drainage  Assessment/Plan: 1 Day Post-Op Procedure(s) (LRB): OPEN REDUCTION INTERNAL FIXATION (ORIF) INTERTROCH HIP FRACTURE, LEFT (Left) Up with therapy  Will need skilled nursing post this hospital stay.    Mcarthur Rossetti 12/30/2017, 12:01 PM

## 2017-12-30 NOTE — Progress Notes (Signed)
Occupational Therapy Evaluation Patient Details Name: [name] MRN: [id number] DOB: 16-Feb-1927 Today's Date: 12/30/2017    History of Present Illness Pt is an 84 y/o male who presents s/p mechanical fall at home. Pt was attempting to sit in a desk chair, but sat on the arm rest causing the chair to rotate and pt to fall. He sustained a L comminuted intertrochanteric hip fx now s/p ORIF on 12/29/17.  PMH significant for renal insuffiency, DM, dementia, Barrett's esophagus.   Clinical Impression   Patient s/p fall and ORIF to L hip, see problem list below for current deficits.  History limited due to hx of dementia, HOH making communication difficult at times, but patient is oriented.  Patient currently requires +2 assistance for bed mobility, functional transfers using RW and LB ADL, min assist for UB ADL, and min guard to maintain unsupported sitting balance dynamically due to pain in L hip.  At this time unable to get complete history or PLOF, and due to level of assistance required, patient will benefit from continued OT services while admitted and after discharge at SNF level in order to decrease burden of care prior to returning home.  Will continue to follow while admitted.     Follow Up Recommendations  SNF;Supervision/Assistance - 24 hour    Equipment Recommendations  Other (comment)(TBD at next venue of care)    Recommendations for Other Services       Precautions / Restrictions Precautions Precautions: Fall Restrictions Weight Bearing Restrictions: Yes LLE Weight Bearing: Weight bearing as tolerated      Mobility Bed Mobility Overal bed mobility: Needs Assistance Bed Mobility: Supine to Sit     Supine to sit: Max assist;+2 for physical assistance;HOB elevated     General bed mobility comments: VCs for sequencing and technique.  Patient able to pull minimally using rails after instructed, but requried assistance with LE movement, scooting with bed pad, and trunk  elevation into full sitting  Transfers Overall transfer level: Needs assistance Equipment used: Rolling walker (2 wheeled) Transfers: Sit to/from [facility] Sit to Stand: Max assist;+2 physical assistance;From elevated surface Stand pivot transfers: Max assist;+2 physical assistance;From elevated surface       General transfer comment: Verbal cueing for seqencing and safety. Patient was able to ascend with +2 support, limited movement in L LE due to pain and forward flexed posture throughout transfer. Poor control descending to recliner.     Balance Overall balance assessment: Needs assistance Sitting-balance support: Feet supported;Single extremity supported Sitting balance-Leahy Scale: Fair Sitting balance - Comments: statically able to maintain sitting with close SPV, intermittent min guard assist provided due to R lateral lean due to L hip pain Postural control: Right lateral lean Standing balance support: Bilateral upper extremity supported;During functional activity Standing balance-Leahy Scale: Zero Standing balance comment: +2 required                           ADL either performed or assessed with clinical judgement   ADL Overall ADL's : Needs assistance/impaired Eating/Feeding: Supervision/ safety;Bed level   Grooming: Minimal assistance;Bed level   Upper Body Bathing: Minimal assistance;Bed level   Lower Body Bathing: Maximal assistance;Bed level   Upper Body Dressing : Moderate assistance;Sitting   Lower Body Dressing: Total assistance;+2 for physical assistance;+2 for safety/equipment;Sit to/from stand   Toilet Transfer: Maximal assistance;+2 for physical assistance;+2 for safety/equipment;RW;Cueing for safety;Stand-pivot;Cueing for sequencing(simulated to recliner )   Toileting- Clothing Manipulation and Hygiene:  Total assistance;+2 for physical assistance;+2 for safety/equipment;Sit to/from stand       Functional mobility during  ADLs: +2 for physical assistance;+2 for safety/equipment;Maximal assistance;Rolling walker(stand pivot to recliner only) General ADL Comments: patinet signficantly limited by pain in L LE, decreased tolerance and cognition      Vision   Vision Assessment?: No apparent visual deficits     Perception     Praxis      Pertinent Vitals/Pain Pain Assessment: Faces Faces Pain Scale: Hurts whole lot Pain Location: LLE with movement. When asked about pain at rest pt states he has no pain.  Pain Descriptors / Indicators: Operative site guarding;Moaning Pain Intervention(s): Limited activity within patient's tolerance;Repositioned;Monitored during session     Hand Dominance     Extremity/Trunk Assessment Upper Extremity Assessment Upper Extremity Assessment: Generalized weakness;RUE deficits/detail RUE Deficits / Details: limited R shoulder ROM, FF AROM to 30 degrees only RUE: Unable to fully assess due to pain   Lower Extremity Assessment Lower Extremity Assessment: Defer to PT evaluation LLE Deficits / Details: s/p ORIF   Cervical / Trunk Assessment Cervical / Trunk Assessment: Other exceptions Cervical / Trunk Exceptions: Forward head posture with rounded shoulders   Communication Communication Communication: HOH   Cognition Arousal/Alertness: Awake/alert Behavior During Therapy: WFL for tasks assessed/performed Overall Cognitive Status: History of cognitive impairments - at baseline                                 General Comments: assume at baseline, family not available to confirm   General Comments       Exercises     Shoulder Instructions      Home Living Family/patient expects to be discharged to:: Private residence                                 Additional Comments: Pt is a poor historian with hx of dementia.  Initally he reports living alone, then reports living with son.  Per chart review of prior admission, patient was living  with his children in 1 level home.  Reports using RW for mobility, unable to discern ADL assistance at this time.       Prior Functioning/Environment          Comments: patient unable to provide PLOF details        OT Problem List: Decreased strength;Decreased range of motion;Decreased activity tolerance;Impaired balance (sitting and/or standing);Decreased coordination;Decreased safety awareness;Decreased cognition;Decreased knowledge of use of DME or AE;Decreased knowledge of precautions;Pain      OT Treatment/Interventions: Self-care/ADL training;Therapeutic exercise;DME and/or AE instruction;Therapeutic activities;Patient/family education;Balance training    OT Goals(Current goals can be found in the care plan section) Acute Rehab OT Goals Patient Stated Goal: Pt did not state goals  OT Frequency: Min 2X/week   Barriers to D/C:            Co-evaluation PT/OT/SLP Co-Evaluation/Treatment: Yes Reason for Co-Treatment: Necessary to address cognition/behavior during functional activity;For patient/therapist safety;To address functional/ADL transfers PT goals addressed during session: Mobility/safety with mobility;Balance;Proper use of DME OT goals addressed during session: ADL's and self-care      AM-PAC PT "6 Clicks" Daily Activity     Outcome Measure Help from another person eating meals?: A Little Help from another person taking care of personal grooming?: A Little Help from another person toileting, which includes using toliet, bedpan, or urinal?:  Total Help from another person bathing (including washing, rinsing, drying)?: A Lot Help from another person to put on and taking off regular upper body clothing?: A Little Help from another person to put on and taking off regular lower body clothing?: Total 6 Click Score: 13   End of Session Equipment Utilized During Treatment: Gait belt;Rolling walker Nurse Communication: Mobility status;Need for lift equipment  Activity  Tolerance: Patient limited by pain Patient left: in chair;with call bell/phone within reach;with chair alarm set  OT Visit Diagnosis: Other abnormalities of gait and mobility (R26.89);Muscle weakness (generalized) (M62.81);Pain Pain - Right/Left: Left Pain - part of body: Hip;Leg                Time: 7207-2182 OT Time Calculation (min): 21 min Charges:  OT General Charges $OT Visit: 1 Visit OT Evaluation $OT Eval Moderate Complexity: 1 Mod G-Codes:     Delight Stare, OTR/L  Pager [place] 12/30/2017, 11:18 AM

## 2017-12-31 DIAGNOSIS — Z419 Encounter for procedure for purposes other than remedying health state, unspecified: Secondary | ICD-10-CM

## 2017-12-31 LAB — HEMOGLOBIN AND HEMATOCRIT, BLOOD
HCT: 30.5 % — ABNORMAL LOW (ref 39.0–52.0)
Hemoglobin: 9.7 g/dL — ABNORMAL LOW (ref 13.0–17.0)

## 2017-12-31 LAB — CBC
HCT: 21.2 % — ABNORMAL LOW (ref 39.0–52.0)
Hemoglobin: 6.5 g/dL — CL (ref 13.0–17.0)
MCH: 29.5 pg (ref 26.0–34.0)
MCHC: 30.7 g/dL (ref 30.0–36.0)
MCV: 96.4 fL (ref 78.0–100.0)
Platelets: 90 10*3/uL — ABNORMAL LOW (ref 150–400)
RBC: 2.2 MIL/uL — AB (ref 4.22–5.81)
RDW: 14.2 % (ref 11.5–15.5)
WBC: 6.8 10*3/uL (ref 4.0–10.5)

## 2017-12-31 LAB — RETICULOCYTES
RBC.: 2.32 MIL/uL — ABNORMAL LOW (ref 4.22–5.81)
Retic Count, Absolute: 25.5 10*3/uL (ref 19.0–186.0)
Retic Ct Pct: 1.1 % (ref 0.4–3.1)

## 2017-12-31 LAB — IRON AND TIBC
IRON: 7 ug/dL — AB (ref 45–182)
Saturation Ratios: 4 % — ABNORMAL LOW (ref 17.9–39.5)
TIBC: 164 ug/dL — AB (ref 250–450)
UIBC: 157 ug/dL

## 2017-12-31 LAB — FOLATE: Folate: 29 ng/mL (ref >5.9)

## 2017-12-31 LAB — VITAMIN B12: Vitamin B-12: 415 pg/mL (ref 180–914)

## 2017-12-31 LAB — FERRITIN: FERRITIN: 288 ng/mL (ref 24–336)

## 2017-12-31 LAB — PREPARE RBC (CROSSMATCH)

## 2017-12-31 MED ORDER — SODIUM CHLORIDE 0.9% IV SOLUTION
Freq: Once | INTRAVENOUS | Status: DC
  Administered 2017-12-31: 10:00:00 via INTRAVENOUS

## 2017-12-31 MED ORDER — SODIUM CHLORIDE 0.9% IV SOLUTION
Freq: Once | INTRAVENOUS | Status: AC
  Administered 2017-12-31: 09:00:00 via INTRAVENOUS

## 2017-12-31 MED ORDER — PANTOPRAZOLE SODIUM 40 MG PO TBEC
40.0000 mg | DELAYED_RELEASE_TABLET | Freq: Two times a day (BID) | ORAL | Status: DC
  Administered 2017-12-31 – 2018-01-01 (×4): 40 mg via ORAL
  Filled 2017-12-31 (×4): qty 1

## 2017-12-31 MED ORDER — SODIUM POLYSTYRENE SULFONATE 15 GM/60ML PO SUSP
30.0000 g | Freq: Once | ORAL | Status: AC
  Administered 2017-12-31: 30 g via ORAL
  Filled 2017-12-31: qty 120

## 2017-12-31 MED ORDER — FUROSEMIDE 10 MG/ML IJ SOLN
20.0000 mg | Freq: Once | INTRAMUSCULAR | Status: AC
  Administered 2017-12-31: 20 mg via INTRAVENOUS
  Filled 2017-12-31: qty 2

## 2017-12-31 MED ORDER — DIPHENHYDRAMINE HCL 50 MG/ML IJ SOLN: 25.0000 mg | Freq: Four times a day (QID) | INTRAMUSCULAR | Status: DC | PRN

## 2017-12-31 NOTE — Care Management Important Message (Signed)
Important Message  Patient Details  Name: Kevon Tench Hemm MRN: [id number] Date of Birth: 1927-03-12   Medicare Important Message Given:  Yes    Erland Vivas 12/31/2017, 1:55 PM

## 2017-12-31 NOTE — Progress Notes (Signed)
Physical Therapy Treatment Patient Details Name: [name] MRN: [id number] DOB: 13-Jan-1927 Today's Date: 12/31/2017    History of Present Illness Pt is an 84 y/o male who presents s/p mechanical fall at home. Pt was attempting to sit in a desk chair, but sat on the arm rest causing the chair to rotate and pt to fall. He sustained a L comminuted intertrochanteric hip fx now s/p ORIF on 12/29/17.  PMH significant for renal insuffiency, DM, dementia, Barrett's esophagus.    PT Comments    Pt performed bed mobility and transfer into sara stedy frame to assist with OOB to chair transfer.  Pt fearful of falling and hesitant to follow commands and tactile cues to achieve standing.  Plan next session for standing tolerance and emphasis on upper trunk and head control.  Pt continues to require SNF placement to improve strength and function before returning home.   Follow Up Recommendations  SNF;Supervision/Assistance - 24 hour     Equipment Recommendations  Other (comment)(TBD at next venue of care.  )    Recommendations for Other Services       Precautions / Restrictions Precautions Precautions: Fall Restrictions Weight Bearing Restrictions: Yes LLE Weight Bearing: Weight bearing as tolerated    Mobility  Bed Mobility Overal bed mobility: Needs Assistance Bed Mobility: Supine to Sit     Supine to sit: Max assist     General bed mobility comments: Pt required assistance to move LE s to edge of bed and to elevate trunk into sitting.  Pt with R lateral lean and increased assistance to correct posture.    Transfers Overall transfer level: Needs assistance Equipment used: (sara stedy) Transfers: Sit to/from Stand Sit to Stand: Max assist;+2 physical assistance;From elevated surface         General transfer comment: VCs for hand placement to pull into standing, Pt required assistance of +2 to boost into standing with 3rd person flipping stedy plates.  Pt with flexed posture and  resting on forearms in stedy frame.    Ambulation/Gait Ambulation/Gait assistance: (gt is not appropriate at this time based on innability to stand upright.  )               Stairs             Wheelchair Mobility    Modified Rankin (Stroke Patients Only)       Balance Overall balance assessment: Needs assistance Sitting-balance support: Feet supported;Single extremity supported Sitting balance-Leahy Scale: Fair       Standing balance-Leahy Scale: Poor                              Cognition Arousal/Alertness: Awake/alert Behavior During Therapy: WFL for tasks assessed/performed Overall Cognitive Status: History of cognitive impairments - at baseline                                 General Comments: assume at baseline, family not available to confirm      Exercises      General Comments        Pertinent Vitals/Pain Pain Assessment: Faces Faces Pain Scale: Hurts whole lot Pain Location: LLE with movement. Pain Descriptors / Indicators: Operative site guarding;Moaning Pain Intervention(s): Monitored during session;Repositioned;Ice applied    Home Living  Prior Function            PT Goals (current goals can now be found in the care plan section) Acute Rehab PT Goals Patient Stated Goal: Pt did not state goals Potential to Achieve Goals: Good Progress towards PT goals: Progressing toward goals    Frequency    Min 3X/week      PT Plan Current plan remains appropriate    Co-evaluation              AM-PAC PT "6 Clicks" Daily Activity  Outcome Measure  Difficulty turning over in bed (including adjusting bedclothes, sheets and blankets)?: Unable Difficulty moving from lying on back to sitting on the side of the bed? : Unable Difficulty sitting down on and standing up from a chair with arms (e.g., wheelchair, bedside commode, etc,.)?: Unable Help needed moving to and from a bed  to chair (including a wheelchair)?: Total Help needed walking in hospital room?: Total Help needed climbing 3-5 steps with a railing? : Total 6 Click Score: 6    End of Session Equipment Utilized During Treatment: Gait belt Activity Tolerance: Patient limited by pain Patient left: in chair;with call bell/phone within reach;with chair alarm set Nurse Communication: Mobility status PT Visit Diagnosis: Unsteadiness on feet (R26.81);Pain;Difficulty in walking, not elsewhere classified (R26.2);History of falling (Z91.81) Pain - Right/Left: Left Pain - part of body: Leg;Hip     Time: 8032-1224 PT Time Calculation (min) (ACUTE ONLY): 22 min  Charges:  $Therapeutic Activity: 8-22 mins                    G Codes:       [name], PTA pager [phone]    [name] 12/31/2017, 2:04 PM

## 2017-12-31 NOTE — Progress Notes (Signed)
1300 First unit of Packed cells completed. Will start the second unit after PT.

## 2017-12-31 NOTE — Progress Notes (Signed)
CRITICAL VALUE ALERT  Critical Value:  HMG 6.5  Date & Time Notied:  12/28/17 05:56  Provider Notified: Dr. Baltazar Najjar 12/28/17 06:20  Orders Received/Actions taken: awaiting orders

## 2017-12-31 NOTE — NC FL2 (Signed)
[place] MEDICAID FL2 LEVEL OF CARE SCREENING TOOL     IDENTIFICATION  Patient Name: [name] Birthdate: 22-Aug-1926 Sex: male Admission Date (Current Location): 12/28/2017  [facility] and [place] Number:  [employer] and Address:  [facility] [place], [place] [address] [place], [place] [address]      Provider Number: [id number]  Attending Physician Name and Address:  Thurnell Lose, MD  Relative Name and Phone Number:       Current Level of Care: Hospital Recommended Level of Care: [place] Prior Approval Number:    Date Approved/Denied:   PASRR Number: [id number]  Discharge Plan: SNF    Current Diagnoses: Patient Active Problem List   Diagnosis Date Noted  . Displaced intertrochanteric fracture of left femur, initial encounter for closed fracture ([place])   . Hip fracture ([place]) 12/28/2017  . Hip fracture, unspecified laterality, closed, initial encounter ([place]) 12/28/2017  . Encephalopathy 12/28/2017  . Closed fracture of left ulnar styloid 11/28/2012  . Dizziness 11/28/2012  . Acute renal failure ([place]) 04/19/2011  . Proximal humeral fracture 04/19/2011  . Fall 04/19/2011  . DM (diabetes mellitus) ([place]) 04/19/2011  . HTN (hypertension) 04/19/2011  . Anemia 04/19/2011  . Dementia 04/19/2011  . BPH (benign prostatic hyperplasia) 04/19/2011    Orientation RESPIRATION BLADDER Height & Weight     Self  Normal Continent Weight: 161 lb (73 kg) Height:     BEHAVIORAL SYMPTOMS/MOOD NEUROLOGICAL BOWEL NUTRITION STATUS      Continent Diet(regular)  AMBULATORY STATUS COMMUNICATION OF NEEDS Skin   Extensive Assist Verbally Surgical wounds(closed left hip incision, ABD and gauze)                       Personal Care Assistance Level of Assistance  Bathing, Feeding, Dressing Bathing Assistance: Maximum assistance Feeding assistance: Limited assistance Dressing Assistance: Maximum assistance     Functional Limitations  Info  Sight, Hearing, Speech Sight Info: Adequate Hearing Info: Adequate Speech Info: Adequate    SPECIAL CARE FACTORS FREQUENCY  PT (By licensed PT), OT (By licensed OT)     PT Frequency: 5x/wk OT Frequency: 5x/wk            Contractures Contractures Info: Not present    Additional Factors Info  Code Status, Allergies Code Status Info: Full Allergies Info: NKA           Current Medications (12/31/2017):  This is the current hospital active medication list Current Facility-Administered Medications  Medication Dose Route Frequency Provider Last Rate Last Dose  . acetaminophen (TYLENOL) tablet 325-650 mg  325-650 mg Oral Q6H PRN Mcarthur Rossetti, MD   650 mg at 12/31/17 0948  . alum & mag hydroxide-simeth (MAALOX/MYLANTA) 200-200-20 MG/5ML suspension 30 mL  30 mL Oral Q4H PRN Mcarthur Rossetti, MD      . aspirin EC tablet 325 mg  325 mg Oral Q breakfast Mcarthur Rossetti, MD   325 mg at 12/31/17 0841  . diphenhydrAMINE (BENADRYL) injection 25 mg  25 mg Intravenous Q6H PRN Thurnell Lose, MD      . docusate sodium (COLACE) capsule 100 mg  100 mg Oral BID Mcarthur Rossetti, MD   100 mg at 12/31/17 1311  . heparin injection 5,000 Units  5,000 Units Subcutaneous Q8H Mcarthur Rossetti, MD   5,000 Units at 12/31/17 1318  . hydrALAZINE (APRESOLINE) tablet 25 mg  25 mg Oral Q8H PRN Mcarthur Rossetti, MD  25 mg at 12/30/17 2136  . HYDROcodone-acetaminophen (NORCO/VICODIN) 5-325 MG per tablet 1-2 tablet  1-2 tablet Oral Q4H PRN Mcarthur Rossetti, MD   1 tablet at 12/31/17 1310  . morphine 2 MG/ML injection 0.5-1 mg  0.5-1 mg Intravenous Q2H PRN Mcarthur Rossetti, MD      . ondansetron [facility]) injection 4 mg  4 mg Intravenous Q6H PRN Mcarthur Rossetti, MD      . pantoprazole (PROTONIX) EC tablet 40 mg  40 mg Oral BID Thurnell Lose, MD   40 mg at 12/31/17 0841  . polyethylene glycol (MIRALAX / GLYCOLAX) packet 17 g  17 g Oral  Daily PRN Mcarthur Rossetti, MD      . sodium chloride 0.9 % bolus 500 mL  500 mL Intravenous Once Danford, Suann Larry, MD         Discharge Medications: Please see discharge summary for a list of discharge medications.  Relevant Imaging Results:  Relevant Lab Results:   Additional Information SS#: [id number]  Geralynn Ochs, LCSW

## 2017-12-31 NOTE — Progress Notes (Signed)
PROGRESS NOTE    [name]  IZT:[id number] DOB: 11/20/1926 DOA: 12/28/2017 PCP: Lemmie Evens, MD      Brief Narrative:   [name] is a 84 y.o. M with moderate dementia, community dwelling, uses walker, CKD IV baseline Cr 2.0, AOCD,DM2 and HTN who presented to any pain hospital with a mechanical fall and left-sided hip fracture.  He was transferred to [facility] for further care he underwent ORIF on 12/29/2017 by orthopedic surgeon Dr. Ninfa Linden.   Subjective:   Demented and extremely hard of hearing and unreliable historian but denies any headache chest or hip pain.    Assessment & Plan:  Mechanical fall with LEFT hip fracture  -seen by orthopedics underwent surgical correction on 12/29/2017, does have perioperative blood loss related anemia on top of anemia of chronic disease, will get 2 units of packed RBC on 12/31/2017, no signs of ongoing GI blood loss, his BUN is stable no bright red in stool reported, discussed with nursing staff in detail.  On aspirin for DVT prophylaxis per orthopedics, weightbearing as tolerated, will require SNF once stable.    ARF on CKD stage V.  Baseline creatinine around 2.  Renal function worse due to anemia and blood loss, transfused 2 units on 12/31/2017, avoid nephrotoxins, Kayexalate 1 dose, repeat BMP tomorrow.   Anemia of chronic renal insufficiency along with perioperative blood loss related anemia.  He received 2 units of blood during surgery in the OR and will get another 2 units on 12/31/2017, staff no black stool or blood in stool reported and they will continue to monitor, placed on PPI, will trend H&H.  He has seen [facility] in the past, we will call them if any GI blood loss suspected.    History of Diabetes 2 - diet controlled  Lab Results  Component Value Date   HGBA1C 5.5 12/30/2017   CBG (last 3)  Recent Labs    12/29/17 0835 12/29/17 1134 12/29/17 1640  GLUCAP 99 87 121*    Hypertension  -Hydralazine PRN For severe range  pressures.  Acute encephalopathy on top of underlying moderate dementia and hearing loss.  Supportive care, PRN Haldol, minimize narcotics and benzodiazepines.    DVT prophylaxis: Heparin and aspirin Code Status: FULL Family Communication: None present Disposition.  SNF once H&H and renal function is stable   Consultants:   Orthopedics  Procedures:   ORIF 7/7  Antimicrobials:   None      Objective: Vitals:   12/29/17 2012 12/30/17 0440 12/30/17 1505 12/30/17 2003  BP: (!) 159/60 (!) 167/60 (!) 161/55 (!) 176/66  Pulse: 92 96 85 83  Resp: 16     Temp: 99.1 F (37.3 C) (!) 101.1 F (38.4 C) 98.2 F (36.8 C) 98.5 F (36.9 C)  TempSrc: Oral Oral Oral Oral  SpO2: 100% 95% 96% 100%  Weight:        Intake/Output Summary (Last 24 hours) at 12/31/2017 0909 Last data filed at 12/31/2017 0217 Gross per 24 hour  Intake 120 ml  Output 1450 ml  Net -1330 ml   Filed Weights   12/28/17 1817  Weight: 73 kg (161 lb)    Examination:  Awake, pleasantly confused, No new F.N deficits, in all 4 extremities by himself, unable to follow commands reliably due to underlying dementia [name],PERRAL Supple Neck,No JVD, No cervical lymphadenopathy appriciated.  Symmetrical Chest wall movement, Good air movement bilaterally, CTAB RRR,No Gallops, Rubs or new Murmurs, No Parasternal Heave +ve B.Sounds, Abd Soft,  No tenderness, No organomegaly appriciated, No rebound - guarding or rigidity. No Cyanosis, Clubbing or edema, left hip postop site appears stable with no significant hematoma around the bandages     Data Reviewed: I have personally reviewed following labs and imaging studies:  CBC: Recent Labs  Lab 12/28/17 1920 12/30/17 0429 12/31/17 0522  WBC 5.4 7.2 6.8  NEUTROABS 4.3  --   --   HGB 7.6* 7.6* 6.5*  HCT 24.4* 24.1* 21.2*  MCV 95.3 94.5 96.4  PLT 133* 94* 90*   Basic Metabolic Panel: Recent Labs  Lab 12/28/17 1920 12/30/17 0429 12/31/17 0522  NA 139 144 142    K 5.1 5.5* 5.2*  CL 111 115* 113*  CO2 23 25 27   GLUCOSE 127* 136* 102*  BUN 34* 37* 46*  CREATININE 1.75* 2.37* 2.91*  CALCIUM 8.4* 8.2* 8.1*   GFR: CrCl cannot be calculated (Unknown ideal weight.). Liver Function Tests: Recent Labs  Lab 12/30/17 0429 12/31/17 0522  AST 22 19  ALT 9 5  ALKPHOS 53 56  BILITOT 0.7 0.6  PROT 5.2* 4.9*  ALBUMIN 2.6* 2.2*   No results for input(s): LIPASE, AMYLASE in the last 168 hours. No results for input(s): AMMONIA in the last 168 hours. Coagulation Profile: Recent Labs  Lab 12/28/17 1920  INR 1.16   Cardiac Enzymes: No results for input(s): CKTOTAL, CKMB, CKMBINDEX, TROPONINI in the last 168 hours. BNP (last 3 results) No results for input(s): PROBNP in the last 8760 hours. HbA1C: Recent Labs    12/30/17 0429  HGBA1C 5.5   CBG: Recent Labs  Lab 12/28/17 2141 12/29/17 0835 12/29/17 1134 12/29/17 1640  GLUCAP 119* 99 87 121*   Lipid Profile: No results for input(s): CHOL, HDL, LDLCALC, TRIG, CHOLHDL, LDLDIRECT in the last 72 hours. Thyroid Function Tests: No results for input(s): TSH, T4TOTAL, FREET4, T3FREE, THYROIDAB in the last 72 hours. Anemia Panel: No results for input(s): VITAMINB12, FOLATE, FERRITIN, TIBC, IRON, RETICCTPCT in the last 72 hours. Urine analysis:    Component Value Date/Time   COLORURINE YELLOW 04/27/2017 0023   APPEARANCEUR CLOUDY (A) 04/27/2017 0023   LABSPEC 1.015 04/27/2017 0023   PHURINE 5.0 04/27/2017 0023   GLUCOSEU NEGATIVE 04/27/2017 0023   HGBUR NEGATIVE 04/27/2017 0023   BILIRUBINUR NEGATIVE 04/27/2017 0023   KETONESUR NEGATIVE 04/27/2017 0023   PROTEINUR 100 (A) 04/27/2017 0023   UROBILINOGEN 0.2 11/28/2012 1626   NITRITE NEGATIVE 04/27/2017 0023   LEUKOCYTESUR NEGATIVE 04/27/2017 0023   Sepsis Labs: @LABRCNTIP (procalcitonin:4,lacticacidven:4)  ) Recent Results (from the past 240 hour(s))  MRSA PCR Screening     Status: None   Collection Time: 12/29/17  5:32 AM  Result  Value Ref Range Status   MRSA by PCR NEGATIVE NEGATIVE Final    Comment:        The GeneXpert MRSA Assay (FDA approved for NASAL specimens only), is one component of a comprehensive MRSA colonization surveillance program. It is not intended to diagnose MRSA infection nor to guide or monitor treatment for MRSA infections. Performed at [facility], [place] [address] [place], [place] [address]     Radiology Studies: Dg C-arm 1-60 Min  Result Date: 12/29/2017 CLINICAL DATA:  Intraoperative imaging for fixation of a left hip fracture the patient suffered in a fall 12/28/2017. Initial encounter. EXAM: LEFT FEMUR 2 VIEWS; DG C-ARM 61-120 MIN COMPARISON:  Plain films left hip 12/28/2017. FINDINGS: Four fluoroscopic intraoperative spot views demonstrate placement of a long intramedullary nail with 2 screws across  a comminuted intertrochanteric fracture and a single distal screw. Position and alignment are improved. No acute abnormality. IMPRESSION: Intraoperative imaging for fixation of a left intertrochanteric fracture. Electronically Signed   By: Inge Rise M.D.   On: 12/29/2017 11:58   Dg C-arm 1-60 Min  Result Date: 12/29/2017 CLINICAL DATA:  Intraoperative imaging for fixation of a left hip fracture the patient suffered in a fall 12/28/2017. Initial encounter. EXAM: LEFT FEMUR 2 VIEWS; DG C-ARM 61-120 MIN COMPARISON:  Plain films left hip 12/28/2017. FINDINGS: Four fluoroscopic intraoperative spot views demonstrate placement of a long intramedullary nail with 2 screws across a comminuted intertrochanteric fracture and a single distal screw. Position and alignment are improved. No acute abnormality. IMPRESSION: Intraoperative imaging for fixation of a left intertrochanteric fracture. Electronically Signed   By: Inge Rise M.D.   On: 12/29/2017 11:58   Dg Femur Min 2 Views Left  Result Date: 12/29/2017 CLINICAL DATA:  Intraoperative imaging for fixation of a left hip fracture the  patient suffered in a fall 12/28/2017. Initial encounter. EXAM: LEFT FEMUR 2 VIEWS; DG C-ARM 61-120 MIN COMPARISON:  Plain films left hip 12/28/2017. FINDINGS: Four fluoroscopic intraoperative spot views demonstrate placement of a long intramedullary nail with 2 screws across a comminuted intertrochanteric fracture and a single distal screw. Position and alignment are improved. No acute abnormality. IMPRESSION: Intraoperative imaging for fixation of a left intertrochanteric fracture. Electronically Signed   By: Inge Rise M.D.   On: 12/29/2017 11:58     Scheduled Meds: . aspirin EC  325 mg Oral Q breakfast  . docusate sodium  100 mg Oral BID  . furosemide  20 mg Intravenous Once  . heparin  5,000 Units Subcutaneous Q8H  . pantoprazole  40 mg Oral BID   Continuous Infusions: . sodium chloride       LOS: 3 days    Time spent: 25 minutes  Signature  Lala Lund M.D on 12/31/2017 at 9:09 AM  Between 7am to 7pm - Pager - [phone] ( page via [email or web address], text pages only, please mention full 10 digit call back number).  After 7pm go to [email or web address] - password [facility]

## 2017-12-31 NOTE — Progress Notes (Signed)
PT Cancellation Note  Patient Details Name: Johnta Couts Swiech MRN: [id number] DOB: [birth year]   Cancelled Treatment:    Reason Eval/Treat Not Completed: (P) Medical issues which prohibited therapy(HGB 6.5 receiving 1st unit of blood will f/u post transfusion pending time to complete.  )   Lisandro Meggett Eli Hose 12/31/2017, 12:36 PM  Governor Rooks, PTA pager [phone]

## 2018-01-01 LAB — CBC
HCT: 28.8 % — ABNORMAL LOW (ref 39.0–52.0)
HEMOGLOBIN: 9.2 g/dL — AB (ref 13.0–17.0)
MCH: 29.6 pg (ref 26.0–34.0)
MCHC: 31.9 g/dL (ref 30.0–36.0)
MCV: 92.6 fL (ref 78.0–100.0)
PLATELETS: 103 10*3/uL — AB (ref 150–400)
RBC: 3.11 MIL/uL — ABNORMAL LOW (ref 4.22–5.81)
RDW: 14.5 % (ref 11.5–15.5)
WBC: 6.1 10*3/uL (ref 4.0–10.5)

## 2018-01-01 LAB — COMPREHENSIVE METABOLIC PANEL
ALT: 5 U/L (ref 0–44)
AST: 19 U/L (ref 15–41)
AST: 20 U/L (ref 15–41)
Albumin: 2.2 g/dL — ABNORMAL LOW (ref 3.5–5.0)
Albumin: 2.2 g/dL — ABNORMAL LOW (ref 3.5–5.0)
Alkaline Phosphatase: 56 U/L (ref 38–126)
Alkaline Phosphatase: 55 U/L (ref 38–126)
Anion gap: 2 — ABNORMAL LOW (ref 5–15)
Anion gap: 13 (ref 5–15)
BUN: 46 mg/dL — AB (ref 8–23)
BUN: 52 mg/dL — ABNORMAL HIGH (ref 8–23)
CALCIUM: 8.1 mg/dL — AB (ref 8.9–10.3)
CHLORIDE: 113 mmol/L — AB (ref 98–111)
CHLORIDE: 109 mmol/L (ref 98–111)
CO2: 27 mmol/L (ref 22–32)
CO2: 20 mmol/L — AB (ref 22–32)
CREATININE: 2.91 mg/dL — AB (ref 0.61–1.24)
CREATININE: 2.71 mg/dL — AB (ref 0.61–1.24)
Calcium: 8.1 mg/dL — ABNORMAL LOW (ref 8.9–10.3)
GFR, EST AFRICAN AMERICAN: 21 mL/min — AB (ref >60)
GFR, EST AFRICAN AMERICAN: 22 mL/min — AB (ref >60)
GFR, EST NON AFRICAN AMERICAN: 18 mL/min — AB (ref >60)
GFR, EST NON AFRICAN AMERICAN: 19 mL/min — AB (ref >60)
Glucose, Bld: 102 mg/dL — ABNORMAL HIGH (ref 70–99)
Glucose, Bld: 96 mg/dL (ref 70–99)
POTASSIUM: 4.5 mmol/L (ref 3.5–5.1)
Potassium: 5.2 mmol/L — ABNORMAL HIGH (ref 3.5–5.1)
SODIUM: 142 mmol/L (ref 135–145)
Sodium: 142 mmol/L (ref 135–145)
Total Bilirubin: 0.6 mg/dL (ref 0.3–1.2)
Total Bilirubin: 0.9 mg/dL (ref 0.3–1.2)
Total Protein: 4.9 g/dL — ABNORMAL LOW (ref 6.5–8.1)
Total Protein: 5.4 g/dL — ABNORMAL LOW (ref 6.5–8.1)

## 2018-01-01 LAB — GLUCOSE, CAPILLARY: Glucose-Capillary: 91 mg/dL (ref 70–99)

## 2018-01-01 MED ORDER — FOLIC ACID 1 MG PO TABS
1.0000 mg | ORAL_TABLET | Freq: Every day | ORAL | Status: DC
  Administered 2018-01-01 – 2018-01-03 (×3): 1 mg via ORAL
  Filled 2018-01-01 (×3): qty 1

## 2018-01-01 MED ORDER — HYDROCODONE-ACETAMINOPHEN 5-325 MG PO TABS: 1.0000 | ORAL_TABLET | ORAL | 0 refills | Status: DC | PRN

## 2018-01-01 MED ORDER — CYANOCOBALAMIN 1000 MCG/ML IJ SOLN
1000.0000 ug | Freq: Once | INTRAMUSCULAR | Status: AC
Start: 2018-01-01 — End: 2018-01-01
  Administered 2018-01-01: 1000 ug via SUBCUTANEOUS
  Filled 2018-01-01: qty 1

## 2018-01-01 MED ORDER — SODIUM CHLORIDE 0.9 % IV SOLN
510.0000 mg | Freq: Once | INTRAVENOUS | Status: AC
  Administered 2018-01-01: 510 mg via INTRAVENOUS
  Filled 2018-01-01: qty 17

## 2018-01-01 MED ORDER — POLYETHYLENE GLYCOL 3350 17 G PO PACK
17.0000 g | PACK | Freq: Every day | ORAL | Status: DC
  Administered 2018-01-01 – 2018-01-03 (×3): 17 g via ORAL
  Filled 2018-01-01 (×3): qty 1

## 2018-01-01 MED ORDER — ASPIRIN 325 MG PO TBEC: 325.0000 mg | DELAYED_RELEASE_TABLET | Freq: Every day | ORAL | 0 refills | Status: DC

## 2018-01-01 MED ORDER — SODIUM CHLORIDE 0.9 % IV SOLN
INTRAVENOUS | Status: DC
  Administered 2018-01-01 – 2018-01-02 (×2): via INTRAVENOUS

## 2018-01-01 MED ORDER — SENNOSIDES-DOCUSATE SODIUM 8.6-50 MG PO TABS
1.0000 | ORAL_TABLET | Freq: Two times a day (BID) | ORAL | Status: DC
  Administered 2018-01-01 – 2018-01-03 (×5): 1 via ORAL
  Filled 2018-01-01 (×5): qty 1

## 2018-01-01 MED ORDER — HEPARIN SODIUM (PORCINE) 5000 UNIT/ML IJ SOLN
5000.0000 [IU] | Freq: Three times a day (TID) | INTRAMUSCULAR | Status: DC
  Administered 2018-01-01 – 2018-01-03 (×6): 5000 [IU] via SUBCUTANEOUS
  Filled 2018-01-01 (×6): qty 1

## 2018-01-01 NOTE — Progress Notes (Signed)
Occupational Therapy Treatment Patient Details Name: [name] MRN: [id number] DOB: 09/25/1926 Today's Date: 01/01/2018    History of present illness Pt is an 84 y/o male who presents s/p mechanical fall at home. Pt was attempting to sit in a desk chair, but sat on the arm rest causing the chair to rotate and pt to fall. He sustained a L comminuted intertrochanteric hip fx now s/p ORIF on 12/29/17.  PMH significant for renal insuffiency, DM, dementia, Barrett's esophagus.   OT comments  Patient requires maximal encouragement to participate in session.  He requires max assist to transition to EOB, minA to supervision for sitting EOB to maintain balance while engaging in grooming activities; patient off loading painful L hip in sitting and demonstrating poor sitting balance unsupported.  He was incontinent of bowels, and required 2+ assist to manage hygiene.  Requires increased time and encouragement for all activities. Continue to recommend SNF at dc.  Daughter in law reports patient with decreased motivation prior to admission as well.  Encouarged patient to call for assistance with toileting when needed.  Will continue to follow while admitted.      Follow Up Recommendations  SNF;Supervision/Assistance - 24 hour    Equipment Recommendations  (TBD at next venue of care)    Recommendations for Other Services      Precautions / Restrictions Precautions Precautions: Fall Restrictions Weight Bearing Restrictions: Yes LLE Weight Bearing: Weight bearing as tolerated       Mobility Bed Mobility Overal bed mobility: Needs Assistance Bed Mobility: Supine to Sit     Supine to sit: Max assist     General bed mobility comments: max A to manage L LE to EOB, modA to ascend trunk scooting hips using bed pad  Transfers                      Balance Overall balance assessment: Needs assistance Sitting-balance support: Feet supported;Single extremity supported Sitting  balance-Leahy Scale: Poor Sitting balance - Comments: unable to maintain static sitting without minA to correct R lateral lean, intermittent moments of supervision                                    ADL either performed or assessed with clinical judgement   ADL Overall ADL's : Needs assistance/impaired     Grooming: Minimal assistance;Sitting;Cueing for safety Grooming Details (indicate cue type and reason): seated EOB to wash face and rinse mouth, R lateral lean with max cueing to correct overall requires minA                      Toileting- Clothing Manipulation and Hygiene: Total assistance;+2 for physical assistance;Bed level Toileting - Clothing Manipulation Details (indicate cue type and reason): incontient BM, +2 assist; setup with urinal able to manage with encouragment         General ADL Comments: limited by pain and motivation to participate     Vision       Perception     Praxis      Cognition Arousal/Alertness: Awake/alert Behavior During Therapy: WFL for tasks assessed/performed Overall Cognitive Status: History of cognitive impairments - at baseline  Exercises     Shoulder Instructions       General Comments daughter in law present initially, reports patient has poor motivation and even PTA layed in bed most of the day    Pertinent Vitals/ Pain       Pain Assessment: Faces Faces Pain Scale: Hurts even more Pain Location: LLE with movement. Pain Descriptors / Indicators: Operative site guarding;Moaning Pain Intervention(s): Limited activity within patient's tolerance;Monitored during session;Repositioned  Home Living                                          Prior Functioning/Environment              Frequency  Min 2X/week        Progress Toward Goals  OT Goals(current goals can now be found in the care plan section)  Progress towards OT  goals: Progressing toward goals  Acute Rehab OT Goals Patient Stated Goal: Pt did not state goals  Plan Discharge plan remains appropriate;Frequency remains appropriate    Co-evaluation                 AM-PAC PT "6 Clicks" Daily Activity     Outcome Measure   Help from another person eating meals?: A Little Help from another person taking care of personal grooming?: A Little Help from another person toileting, which includes using toliet, bedpan, or urinal?: Total Help from another person bathing (including washing, rinsing, drying)?: A Lot Help from another person to put on and taking off regular upper body clothing?: A Little Help from another person to put on and taking off regular lower body clothing?: Total 6 Click Score: 13    End of Session    OT Visit Diagnosis: Other abnormalities of gait and mobility (R26.89);Muscle weakness (generalized) (M62.81);Pain Pain - Right/Left: Left Pain - part of body: Hip;Leg   Activity Tolerance Patient limited by pain;Other (comment)(motivation)   Patient Left in bed;with call bell/phone within reach;with bed alarm set;with SCD's reapplied   Nurse Communication Mobility status        Time: 1638-4665 OT Time Calculation (min): 33 min  Charges: OT General Charges $OT Visit: 1 Visit OT Treatments $Self Care/Home Management : 23-37 mins  Delight Stare, OTR/L  Pager [place] 01/01/2018, 4:31 PM

## 2018-01-01 NOTE — Clinical Social Work Note (Signed)
Clinical Social Work Assessment  Patient Details  Name: [name] MRN: [id number] Date of Birth: October 16, 1926  Date of referral:  12/31/17               Reason for consult:  Facility Placement                Permission sought to share information with:  Facility Sport and exercise psychologist, Family Supports Permission granted to share information::  Yes, Verbal Permission Granted  Name::     Major  Agency::  SNF  Relationship::  Son  Contact Information:     Housing/Transportation Living arrangements for the past 2 months:  Single Family Home Source of Information:  Adult Children Patient Interpreter Needed:  None Criminal Activity/Legal Involvement Pertinent to Current Situation/Hospitalization:  No - Comment as needed Significant Relationships:  Adult Children Lives with:  Self, Adult Children Do you feel safe going back to the place where you live?  Yes Need for family participation in patient care:  Yes (Comment)  Care giving concerns:  Patient from home with son, but will need short term rehab at discharge prior to returning home so that the son can care for him.   Social Worker assessment / plan:  CSW spoke with patient's son over the phone to discuss recommendation for SNF. CSW discussed patient's son's concerns about what's going on with the patient and how he is hard of hearing but he won't tell anyone that he doesn't understand. CSW explained SNF referral process and facility options, and obtained permission to fax out referral and follow up.   Employment status:  Retired Nurse, adult PT Recommendations:  [place] / Referral to community resources:  [place]  Patient/Family's Response to care:  Patient's son agreeable to SNF placement.  Patient/Family's Understanding of and Emotional Response to Diagnosis, Current Treatment, and Prognosis:  Patient's son discussed how the patient probably doesn't  understand what's going on because of both his dementia and his hard of hearing; he will just say "yeah" to everything because he knows that a response is expected, but he probably hasn't heard anyone. Patient's son expressed concern that nurses won't know how to take care of him, but that he knows he can't handle him at home until he can walk better. Patient's son discussed his fear that the patient might not want to participate with therapy, but that he doesn't want him to end up in a wheelchair for the rest of his life because then the son might not be able to provide care for him at home anymore. Patient's son would like to have the patient at [facility], if possible, but is willing to look at other options.  Emotional Assessment Appearance:  Appears stated age Attitude/Demeanor/Rapport:  Unable to Assess Affect (typically observed):  Unable to Assess Orientation:  Oriented to Self Alcohol / Substance use:  Not Applicable Psych involvement (Current and /or in the community):  No (Comment)  Discharge Needs  Concerns to be addressed:  Care Coordination Readmission within the last 30 days:  No Current discharge risk:  Dependent with Mobility Barriers to Discharge:  Continued Medical Work up, [place], [place] 01/01/2018, 9:18 AM

## 2018-01-01 NOTE — Discharge Instructions (Signed)
Attempt full weight as tolerated left hip. Due to fall risk, only up with assistance. New dry dressing to left hip incision as needed.

## 2018-01-01 NOTE — Progress Notes (Signed)
PROGRESS NOTE    [name]  ATF:[id number] DOB: [birth year] DOA: 12/28/2017 PCP: Lemmie Evens, MD      Brief Narrative:   [name] is a 84 y.o. M with moderate dementia, community dwelling, uses walker, CKD IV baseline Cr 2.0, AOCD,DM2 and HTN who presented to any pain hospital with a mechanical fall and left-sided hip fracture.  He was transferred to [facility] for further care he underwent ORIF on 12/29/2017 by orthopedic surgeon Dr. Ninfa Linden.   Subjective:   Hard of hearing therefore interview is limited.  Patient denies any acute complaint.  Keeps on repeating same answer I am good.   Assessment & Plan:  Mechanical fall with LEFT hip fracture  -seen by orthopedics underwent surgical correction on 12/29/2017, does have perioperative blood loss related anemia on top of anemia of chronic disease, will get 2 units of packed RBC on 12/31/2017, no signs of ongoing GI blood loss, his BUN is stable no bright red in stool reported, discussed with nursing staff in detail.  On aspirin for DVT prophylaxis per orthopedics, weightbearing as tolerated, will require SNF once stable.    ARF on CKD stage V.  Baseline creatinine around 2.  Renal function worse due to anemia and blood loss, transfused 2 units on 12/31/2017, avoid nephrotoxins, Kayexalate 1 dose, repeat BMP tomorrow. Giving IV fluids for now.   Anemia of chronic renal insufficiency along with perioperative blood loss related anemia.  He received 2 units of blood during surgery in the OR and will get another 2 units on 12/31/2017, staff no black stool or blood in stool reported and they will continue to monitor, placed on PPI, will trend H&H.  He has seen [facility] in the past, we will call them if any GI blood loss suspected.  Receiving IV Feraheme although unfortunately more than half of it extravasated on the sheets. For now we will just continue oral iron.   History of Diabetes 2 - diet controlled  Lab Results  Component Value  Date   HGBA1C 5.5 12/30/2017   CBG (last 3)  Recent Labs    12/29/17 1640 01/01/18 0613  GLUCAP 121* 91    Hypertension  -Hydralazine PRN For severe range pressures.  Acute encephalopathy on top of underlying moderate dementia and hearing loss.  Supportive care, PRN Haldol, minimize narcotics and benzodiazepines.    DVT prophylaxis: Heparin and aspirin Code Status: FULL Family Communication: None present Disposition.  SNF once H&H and renal function is stable   Consultants:   Orthopedics  Procedures:   ORIF 7/7  Antimicrobials:   None      Objective: Vitals:   12/31/17 1642 12/31/17 1953 01/01/18 0456 01/01/18 1445  BP: (!) 185/61 (!) 182/73 (!) 148/50 (!) 180/68  Pulse: 87 82 76 81  Resp: 16 16 16 16   Temp: 98.8 F (37.1 C) 99.1 F (37.3 C) 98.2 F (36.8 C) 98.7 F (37.1 C)  TempSrc: Oral Oral Oral Oral  SpO2: 98% 100% 100% 95%  Weight:      Height:        Intake/Output Summary (Last 24 hours) at 01/01/2018 1624 Last data filed at 01/01/2018 1600 Gross per 24 hour  Intake 720 ml  Output 1525 ml  Net -805 ml   Filed Weights   12/28/17 1817 12/31/17 1600  Weight: 73 kg (161 lb) 74 kg (163 lb 2.3 oz)    Examination:  Awake, pleasantly confused, No new F.N deficits, in all 4 extremities by  himself, unable to follow commands reliably due to underlying dementia [place].AT,PERRAL Supple Neck,No JVD, No cervical lymphadenopathy appriciated.  Symmetrical Chest wall movement, Good air movement bilaterally, CTAB RRR,No Gallops, Rubs or new Murmurs, No Parasternal Heave +ve B.Sounds, Abd Soft, No tenderness, No organomegaly appriciated, No rebound - guarding or rigidity. No Cyanosis, Clubbing or edema, left hip postop site appears stable with no significant hematoma around the bandages     Data Reviewed: I have personally reviewed following labs and imaging studies:  CBC: Recent Labs  Lab 12/28/17 1920 12/30/17 0429 12/31/17 0522 12/31/17 2138  01/01/18 0441  WBC 5.4 7.2 6.8  --  6.1  NEUTROABS 4.3  --   --   --   --   HGB 7.6* 7.6* 6.5* 9.7* 9.2*  HCT 24.4* 24.1* 21.2* 30.5* 28.8*  MCV 95.3 94.5 96.4  --  92.6  PLT 133* 94* 90*  --  390*   Basic Metabolic Panel: Recent Labs  Lab 12/28/17 1920 12/30/17 0429 12/31/17 0522 01/01/18 0441  NA 139 144 142 142  K 5.1 5.5* 5.2* 4.5  CL 111 115* 113* 109  CO2 23 25 27  20*  GLUCOSE 127* 136* 102* 96  BUN 34* 37* 46* 52*  CREATININE 1.75* 2.37* 2.91* 2.71*  CALCIUM 8.4* 8.2* 8.1* 8.1*   GFR: Estimated Creatinine Clearance: 16.7 mL/min (A) (by C-G formula based on SCr of 2.71 mg/dL (H)). Liver Function Tests: Recent Labs  Lab 12/30/17 0429 12/31/17 0522 01/01/18 0441  AST 22 19 20   ALT 9 5 <5  ALKPHOS 53 56 55  BILITOT 0.7 0.6 0.9  PROT 5.2* 4.9* 5.4*  ALBUMIN 2.6* 2.2* 2.2*   No results for input(s): LIPASE, AMYLASE in the last 168 hours. No results for input(s): AMMONIA in the last 168 hours. Coagulation Profile: Recent Labs  Lab 12/28/17 1920  INR 1.16   Cardiac Enzymes: No results for input(s): CKTOTAL, CKMB, CKMBINDEX, TROPONINI in the last 168 hours. BNP (last 3 results) No results for input(s): PROBNP in the last 8760 hours. HbA1C: Recent Labs    12/30/17 0429  HGBA1C 5.5   CBG: Recent Labs  Lab 12/28/17 2141 12/29/17 0835 12/29/17 1134 12/29/17 1640 01/01/18 0613  GLUCAP 119* 99 87 121* 91   Lipid Profile: No results for input(s): CHOL, HDL, LDLCALC, TRIG, CHOLHDL, LDLDIRECT in the last 72 hours. Thyroid Function Tests: No results for input(s): TSH, T4TOTAL, FREET4, T3FREE, THYROIDAB in the last 72 hours. Anemia Panel: Recent Labs    12/31/17 0826  VITAMINB12 415  FOLATE 29.0  FERRITIN 288  TIBC 164*  IRON 7*  RETICCTPCT 1.1   Urine analysis:    Component Value Date/Time   COLORURINE YELLOW 04/27/2017 0023   APPEARANCEUR CLOUDY (A) 04/27/2017 0023   LABSPEC 1.015 04/27/2017 0023   PHURINE 5.0 04/27/2017 0023   GLUCOSEU  NEGATIVE 04/27/2017 0023   HGBUR NEGATIVE 04/27/2017 0023   BILIRUBINUR NEGATIVE 04/27/2017 0023   KETONESUR NEGATIVE 04/27/2017 0023   PROTEINUR 100 (A) 04/27/2017 0023   UROBILINOGEN 0.2 11/28/2012 1626   NITRITE NEGATIVE 04/27/2017 0023   LEUKOCYTESUR NEGATIVE 04/27/2017 0023   Sepsis Labs: @LABRCNTIP (procalcitonin:4,lacticacidven:4)  ) Recent Results (from the past 240 hour(s))  MRSA PCR Screening     Status: None   Collection Time: 12/29/17  5:32 AM  Result Value Ref Range Status   MRSA by PCR NEGATIVE NEGATIVE Final    Comment:        The GeneXpert MRSA Assay (FDA approved for NASAL specimens only),  is one component of a comprehensive MRSA colonization surveillance program. It is not intended to diagnose MRSA infection nor to guide or monitor treatment for MRSA infections. Performed at [facility], [place] [address], [place], [place] [address]     Radiology Studies: No results found.   Scheduled Meds: . aspirin EC  325 mg Oral Q breakfast  . folic acid  1 mg Oral Daily  . heparin injection (subcutaneous)  5,000 Units Subcutaneous Q8H  . pantoprazole  40 mg Oral BID  . polyethylene glycol  17 g Oral Daily  . senna-docusate  1 tablet Oral BID   Continuous Infusions: . sodium chloride 75 mL/hr at 01/01/18 1042     LOS: 4 days    Time spent: 25 minutes  Signature  Berle Mull M.D on 01/01/2018 at 4:24 PM  Between 7am to 7pm - Pager -( page via [email or web address], text pages only, please mention full 10 digit call back number).  After 7pm go to [email or web address] - password [email or web address]

## 2018-01-02 LAB — TYPE AND SCREEN
ABO/RH(D): O POS
ANTIBODY SCREEN: NEGATIVE
UNIT DIVISION: 0
UNIT DIVISION: 0
UNIT DIVISION: 0
Unit division: 0
Unit division: 0
Unit division: 0

## 2018-01-02 LAB — COMPREHENSIVE METABOLIC PANEL
ALK PHOS: 52 U/L (ref 38–126)
ANION GAP: 7 (ref 5–15)
AST: 18 U/L (ref 15–41)
Albumin: 2.1 g/dL — ABNORMAL LOW (ref 3.5–5.0)
BUN: 48 mg/dL — ABNORMAL HIGH (ref 8–23)
CO2: 24 mmol/L (ref 22–32)
CREATININE: 2.56 mg/dL — AB (ref 0.61–1.24)
Calcium: 8.3 mg/dL — ABNORMAL LOW (ref 8.9–10.3)
Chloride: 109 mmol/L (ref 98–111)
GFR, EST AFRICAN AMERICAN: 24 mL/min — AB (ref >60)
GFR, EST NON AFRICAN AMERICAN: 21 mL/min — AB (ref >60)
Glucose, Bld: 93 mg/dL (ref 70–99)
Potassium: 4.7 mmol/L (ref 3.5–5.1)
Sodium: 140 mmol/L (ref 135–145)
TOTAL PROTEIN: 5 g/dL — AB (ref 6.5–8.1)
Total Bilirubin: 0.9 mg/dL (ref 0.3–1.2)

## 2018-01-02 LAB — BPAM RBC
BLOOD PRODUCT EXPIRATION DATE: 201908102359
Blood Product Expiration Date: 201908062359
Blood Product Expiration Date: 201908062359
Blood Product Expiration Date: 201908062359
Blood Product Expiration Date: 201908102359
Blood Product Expiration Date: 201908102359
ISSUE DATE / TIME: 201907071007
ISSUE DATE / TIME: 201907071007
ISSUE DATE / TIME: 201907090925
ISSUE DATE / TIME: 201907091614
UNIT TYPE AND RH: 5100
UNIT TYPE AND RH: 5100
Unit Type and Rh: 5100
Unit Type and Rh: 5100
Unit Type and Rh: 5100
Unit Type and Rh: 5100

## 2018-01-02 LAB — CBC
HCT: 28.6 % — ABNORMAL LOW (ref 39.0–52.0)
HEMOGLOBIN: 8.9 g/dL — AB (ref 13.0–17.0)
MCH: 29.4 pg (ref 26.0–34.0)
MCHC: 31.1 g/dL (ref 30.0–36.0)
MCV: 94.4 fL (ref 78.0–100.0)
PLATELETS: 116 10*3/uL — AB (ref 150–400)
RBC: 3.03 MIL/uL — AB (ref 4.22–5.81)
RDW: 14 % (ref 11.5–15.5)
WBC: 5.8 10*3/uL (ref 4.0–10.5)

## 2018-01-02 MED ORDER — SODIUM CHLORIDE 0.9 % IV SOLN
INTRAVENOUS | Status: DC
  Administered 2018-01-02 – 2018-01-03 (×2): via INTRAVENOUS

## 2018-01-02 MED ORDER — AMLODIPINE BESYLATE 5 MG PO TABS
5.0000 mg | ORAL_TABLET | Freq: Every day | ORAL | Status: DC
  Administered 2018-01-02 – 2018-01-03 (×2): 5 mg via ORAL
  Filled 2018-01-02 (×2): qty 1

## 2018-01-02 MED ORDER — ENSURE ENLIVE PO LIQD
237.0000 mL | Freq: Two times a day (BID) | ORAL | Status: DC
  Administered 2018-01-02 – 2018-01-03 (×3): 237 mL via ORAL

## 2018-01-02 MED ORDER — FERROUS SULFATE 325 (65 FE) MG PO TABS
325.0000 mg | ORAL_TABLET | Freq: Two times a day (BID) | ORAL | Status: DC
  Administered 2018-01-02 – 2018-01-03 (×3): 325 mg via ORAL
  Filled 2018-01-02 (×3): qty 1

## 2018-01-02 NOTE — Social Work (Addendum)
CSW called son to discuss SNF offers. CSW provided list of SNF offers and he will review and left CSW know which SNF they select.  CSW will f/u.  3:00pm: family, called back and accepted SNF bed from [place] called admission staff at [facility] and left message requesting a call back to discuss and confirm as Ins. Auth will be needed.  CSW will f/u.  3:45pm: CSW received confirmation from admission staff at [facility] for SNF bed. SNF will initiate Insurance Auth for placement.  CSW left message on voicemail of son, Major advising that SNF will begin Insurance Auth.  CSW will f/u for disposition.  Elissa Hefty, LCSW Clinical Social Worker [phone]

## 2018-01-02 NOTE — Progress Notes (Signed)
Physical Therapy Treatment Patient Details Name: [name] MRN: [id number] DOB: Aug 03, 1926 Today's Date: 01/02/2018    History of Present Illness Pt is an 84 y/o male who presents s/p mechanical fall at home. Pt was attempting to sit in a desk chair, but sat on the arm rest causing the chair to rotate and pt to fall. He sustained a L comminuted intertrochanteric hip fx now s/p ORIF on 12/29/17.  PMH significant for renal insuffiency, DM, dementia, Barrett's esophagus.    PT Comments    Pt progressing slowly towards physical therapy goals. Communication barrier 2 HOH. Pt was able to attempt x2 sit<>stand but unable to progress mobility to gait training. Generally flexed trunk was able to be corrected with assist and tactile cues however pt unable to maintain corrective changes. Will continue to follow and progress as able per POC.    Follow Up Recommendations  SNF;Supervision/Assistance - 24 hour     Equipment Recommendations  Other (comment)(TBD at next venue of care.  )    Recommendations for Other Services       Precautions / Restrictions Precautions Precautions: Fall Restrictions Weight Bearing Restrictions: Yes LLE Weight Bearing: Weight bearing as tolerated    Mobility  Bed Mobility               General bed mobility comments: Pt was received sitting up in the recliner.   Transfers Overall transfer level: Needs assistance Equipment used: Rolling walker (2 wheeled) Transfers: Sit to/from Stand Sit to Stand: Mod assist;+2 physical assistance         General transfer comment: VC's for sequencing and general safety. Pt was able to power-up to full stand with +2 assist for balance support. Pt generally flexed in trunk during transfer and had difficulty raising head to improve posture. Sit<>stand x2 during session. Attempted to have pt bring feet together - he was able to move the LLE slightly but not able to weight shift enough to reposition feet. Was not able to  tolerate further mobility due to pain.   Ambulation/Gait             General Gait Details: Unable to attempt   Stairs             Wheelchair Mobility    Modified Rankin (Stroke Patients Only)       Balance Overall balance assessment: Needs assistance Sitting-balance support: Feet supported;Single extremity supported Sitting balance-Leahy Scale: Poor Sitting balance - Comments: unable to maintain static sitting without minA to correct R lateral lean, intermittent moments of supervision  Postural control: Right lateral lean Standing balance support: Bilateral upper extremity supported;During functional activity Standing balance-Leahy Scale: Poor Standing balance comment: +2 required                            Cognition Arousal/Alertness: Awake/alert Behavior During Therapy: WFL for tasks assessed/performed Overall Cognitive Status: History of cognitive impairments - at baseline                                 General Comments: assume at baseline, family not available to confirm      Exercises General Exercises - Lower Extremity Long Arc Quad: 5 reps    General Comments        Pertinent Vitals/Pain Pain Assessment: Faces Faces Pain Scale: Hurts whole lot Pain Location: LLE with movement. Pain Descriptors / Indicators: Operative  site guarding;Moaning Pain Intervention(s): Monitored during session    Home Living                      Prior Function            PT Goals (current goals can now be found in the care plan section) Acute Rehab PT Goals Patient Stated Goal: Pt did not state goals PT Goal Formulation: Patient unable to participate in goal setting Time For Goal Achievement: 01/13/18 Potential to Achieve Goals: Good Progress towards PT goals: Progressing toward goals    Frequency    Min 3X/week      PT Plan Current plan remains appropriate    Co-evaluation              AM-PAC PT "6 Clicks"  Daily Activity  Outcome Measure  Difficulty turning over in bed (including adjusting bedclothes, sheets and blankets)?: Unable Difficulty moving from lying on back to sitting on the side of the bed? : Unable Difficulty sitting down on and standing up from a chair with arms (e.g., wheelchair, bedside commode, etc,.)?: Unable Help needed moving to and from a bed to chair (including a wheelchair)?: Total Help needed walking in hospital room?: Total Help needed climbing 3-5 steps with a railing? : Total 6 Click Score: 6    End of Session Equipment Utilized During Treatment: Gait belt Activity Tolerance: Patient limited by pain Patient left: in chair;with call bell/phone within reach;with chair alarm set Nurse Communication: Mobility status PT Visit Diagnosis: Unsteadiness on feet (R26.81);Pain;Difficulty in walking, not elsewhere classified (R26.2);History of falling (Z91.81) Pain - Right/Left: Left Pain - part of body: Leg;Hip     Time: 6389-3734 PT Time Calculation (min) (ACUTE ONLY): 15 min  Charges:  $Gait Training: 8-22 mins                    G Codes:       Rolinda Roan, PT, DPT Acute Rehabilitation Services Pager: [place] 01/02/2018, 12:10 PM

## 2018-01-02 NOTE — Plan of Care (Signed)
  Problem: Education: Goal: Knowledge of General Education information will improve Outcome: Progressing   Problem: Health Behavior/Discharge Planning: Goal: Ability to manage health-related needs will improve Outcome: Progressing   

## 2018-01-02 NOTE — Progress Notes (Signed)
PROGRESS NOTE    [name]  KWI:[id number] DOB: Oct 11, 1926 DOA: 12/28/2017 PCP: Lemmie Evens, MD      Brief Narrative:   [name] is a 84 y.o. M with moderate dementia, community dwelling, uses walker, CKD IV baseline Cr 2.0, AOCD,DM2 and HTN who presented to any pain hospital with a mechanical fall and left-sided hip fracture.  He was transferred to [facility] for further care he underwent ORIF on 12/29/2017 by orthopedic surgeon Dr. Ninfa Linden.   Subjective:   No acute event overnight.  Improvement in oral intake.  No diarrhea.  Actually constipated.   Assessment & Plan:  Mechanical fall with LEFT hip fracture  -seen by orthopedics underwent surgical correction on 12/29/2017,  does have perioperative blood loss related anemia on top of anemia of chronic disease, On aspirin for DVT prophylaxis per orthopedics, weightbearing as tolerated, will require SNF once stable.    Acute kidney injury on CKD stage V. Hyperkalemia Baseline creatinine around 2. Renal function worse due to anemia and blood loss, transfused 2 units on 12/31/2017, avoid nephrotoxins, Giving IV fluids for now. Treated with Kayexalate now better.   Anemia of chronic renal insufficiency along with perioperative blood loss related anemia. received 2 units of blood during surgery in the OR and another 2 units on 12/31/2017,  So far per staff no black stool or blood in stool reported Received IV Feraheme although unfortunately more than half of it extravasated. Unsure of the amount and therefore will hold off on further IV Feraheme and continue oral iron. He has seen [facility] in the past  History of Diabetes 2 - diet controlled Hemoglobin A1c 5.5. Glucose well controlled. Monitor.  Hypertension   Hydralazine PRN For severe range pressures. Adding Norvasc.  Acute encephalopathy on top of underlying moderate dementia and hearing loss.  Acute encephalopathy resolved. Supportive care, PRN Haldol, minimize  narcotics and benzodiazepines.  DVT prophylaxis: aspirin, SCD Code Status: FULL Family Communication: None present Disposition.  SNF once H&H and renal function is stable   Consultants:   Orthopedics  Procedures:   ORIF 7/7  Antimicrobials:   None      Objective: Vitals:   01/01/18 1929 01/01/18 2138 01/02/18 0402 01/02/18 1407  BP: (!) 186/68 (!) 173/62 (!) 172/59 (!) 154/57  Pulse:  85 80 93  Resp:    16  Temp:  98.7 F (37.1 C) 98.7 F (37.1 C) 98.5 F (36.9 C)  TempSrc:  Oral Oral Oral  SpO2:  97% 96% 94%  Weight:      Height:        Intake/Output Summary (Last 24 hours) at 01/02/2018 1438 Last data filed at 01/02/2018 0900 Gross per 24 hour  Intake 1813.32 ml  Output 1575 ml  Net 238.32 ml   Filed Weights   12/28/17 1817 12/31/17 1600  Weight: 73 kg (161 lb) 74 kg (163 lb 2.3 oz)    Examination:  Awake, pleasantly confused, No new F.N deficits, in all 4 extremities by himself, unable to follow commands reliably due to underlying dementia [place].AT,PERRAL Supple Neck,No JVD, No cervical lymphadenopathy appriciated.  Symmetrical Chest wall movement, Good air movement bilaterally, CTAB RRR,No Gallops, Rubs or new Murmurs, No Parasternal Heave +ve B.Sounds, Abd Soft, No tenderness, No organomegaly appriciated, No rebound - guarding or rigidity. No Cyanosis, Clubbing or edema, left hip postop site appears stable with no significant hematoma around the bandages     Data Reviewed: I have personally reviewed following labs and imaging  studies:  CBC: Recent Labs  Lab 12/28/17 1920 12/30/17 0429 12/31/17 0522 12/31/17 2138 01/01/18 0441 01/02/18 0336  WBC 5.4 7.2 6.8  --  6.1 5.8  NEUTROABS 4.3  --   --   --   --   --   HGB 7.6* 7.6* 6.5* 9.7* 9.2* 8.9*  HCT 24.4* 24.1* 21.2* 30.5* 28.8* 28.6*  MCV 95.3 94.5 96.4  --  92.6 94.4  PLT 133* 94* 90*  --  103* 176*   Basic Metabolic Panel: Recent Labs  Lab 12/28/17 1920 12/30/17 0429 12/31/17 0522  01/01/18 0441 01/02/18 0336  NA 139 144 142 142 140  K 5.1 5.5* 5.2* 4.5 4.7  CL 111 115* 113* 109 109  CO2 23 25 27  20* 24  GLUCOSE 127* 136* 102* 96 93  BUN 34* 37* 46* 52* 48*  CREATININE 1.75* 2.37* 2.91* 2.71* 2.56*  CALCIUM 8.4* 8.2* 8.1* 8.1* 8.3*   GFR: Estimated Creatinine Clearance: 17.7 mL/min (A) (by C-G formula based on SCr of 2.56 mg/dL (H)). Liver Function Tests: Recent Labs  Lab 12/30/17 0429 12/31/17 0522 01/01/18 0441 01/02/18 0336  AST 22 19 20 18   ALT 9 5 <5 <5  ALKPHOS 53 56 55 52  BILITOT 0.7 0.6 0.9 0.9  PROT 5.2* 4.9* 5.4* 5.0*  ALBUMIN 2.6* 2.2* 2.2* 2.1*   No results for input(s): LIPASE, AMYLASE in the last 168 hours. No results for input(s): AMMONIA in the last 168 hours. Coagulation Profile: Recent Labs  Lab 12/28/17 1920  INR 1.16   Cardiac Enzymes: No results for input(s): CKTOTAL, CKMB, CKMBINDEX, TROPONINI in the last 168 hours. BNP (last 3 results) No results for input(s): PROBNP in the last 8760 hours. HbA1C: No results for input(s): HGBA1C in the last 72 hours. CBG: Recent Labs  Lab 12/28/17 2141 12/29/17 0835 12/29/17 1134 12/29/17 1640 01/01/18 0613  GLUCAP 119* 99 87 121* 91   Lipid Profile: No results for input(s): CHOL, HDL, LDLCALC, TRIG, CHOLHDL, LDLDIRECT in the last 72 hours. Thyroid Function Tests: No results for input(s): TSH, T4TOTAL, FREET4, T3FREE, THYROIDAB in the last 72 hours. Anemia Panel: Recent Labs    12/31/17 0826  VITAMINB12 415  FOLATE 29.0  FERRITIN 288  TIBC 164*  IRON 7*  RETICCTPCT 1.1   Urine analysis:    Component Value Date/Time   COLORURINE YELLOW 04/27/2017 0023   APPEARANCEUR CLOUDY (A) 04/27/2017 0023   LABSPEC 1.015 04/27/2017 0023   PHURINE 5.0 04/27/2017 0023   GLUCOSEU NEGATIVE 04/27/2017 0023   HGBUR NEGATIVE 04/27/2017 0023   BILIRUBINUR NEGATIVE 04/27/2017 0023   KETONESUR NEGATIVE 04/27/2017 0023   PROTEINUR 100 (A) 04/27/2017 0023   UROBILINOGEN 0.2 11/28/2012  1626   NITRITE NEGATIVE 04/27/2017 0023   LEUKOCYTESUR NEGATIVE 04/27/2017 0023   Sepsis Labs: @LABRCNTIP (procalcitonin:4,lacticacidven:4)  ) Recent Results (from the past 240 hour(s))  MRSA PCR Screening     Status: None   Collection Time: 12/29/17  5:32 AM  Result Value Ref Range Status   MRSA by PCR NEGATIVE NEGATIVE Final    Comment:        The GeneXpert MRSA Assay (FDA approved for NASAL specimens only), is one component of a comprehensive MRSA colonization surveillance program. It is not intended to diagnose MRSA infection nor to guide or monitor treatment for MRSA infections. Performed at [facility], [place] [address] [place], [place] [address]     Radiology Studies: No results found.   Scheduled Meds: . amLODipine  5 mg  Oral Daily  . aspirin EC  325 mg Oral Q breakfast  . feeding supplement (ENSURE ENLIVE)  237 mL Oral BID BM  . ferrous sulfate  325 mg Oral BID WC  . folic acid  1 mg Oral Daily  . heparin injection (subcutaneous)  5,000 Units Subcutaneous Q8H  . polyethylene glycol  17 g Oral Daily  . senna-docusate  1 tablet Oral BID   Continuous Infusions: . sodium chloride       LOS: 5 days    Time spent: 25 minutes  Signature  Berle Mull M.D on 01/02/2018 at 2:38 PM  Between 7am to 7pm - Pager -( page via [email or web address], text pages only, please mention full 10 digit call back number).  After 7pm go to [email or web address] - password [facility]

## 2018-01-03 LAB — CBC
HEMATOCRIT: 28.4 % — AB (ref 39.0–52.0)
Hemoglobin: 8.9 g/dL — ABNORMAL LOW (ref 13.0–17.0)
MCH: 29.7 pg (ref 26.0–34.0)
MCHC: 31.3 g/dL (ref 30.0–36.0)
MCV: 94.7 fL (ref 78.0–100.0)
PLATELETS: 134 10*3/uL — AB (ref 150–400)
RBC: 3 MIL/uL — AB (ref 4.22–5.81)
RDW: 13.7 % (ref 11.5–15.5)
WBC: 5.1 10*3/uL (ref 4.0–10.5)

## 2018-01-03 LAB — BASIC METABOLIC PANEL
Anion gap: 6 (ref 5–15)
BUN: 41 mg/dL — AB (ref 8–23)
CO2: 22 mmol/L (ref 22–32)
Calcium: 8.4 mg/dL — ABNORMAL LOW (ref 8.9–10.3)
Chloride: 114 mmol/L — ABNORMAL HIGH (ref 98–111)
Creatinine, Ser: 1.81 mg/dL — ABNORMAL HIGH (ref 0.61–1.24)
GFR calc Af Amer: 36 mL/min — ABNORMAL LOW (ref >60)
GFR, EST NON AFRICAN AMERICAN: 31 mL/min — AB (ref >60)
GLUCOSE: 98 mg/dL (ref 70–99)
POTASSIUM: 4.5 mmol/L (ref 3.5–5.1)
Sodium: 142 mmol/L (ref 135–145)

## 2018-01-03 MED ORDER — POLYETHYLENE GLYCOL 3350 17 G PO PACK: 17.0000 g | PACK | Freq: Every day | ORAL | 0 refills | Status: DC

## 2018-01-03 MED ORDER — AMLODIPINE BESYLATE 5 MG PO TABS: 5.0000 mg | ORAL_TABLET | Freq: Every day | ORAL | 0 refills | Status: DC

## 2018-01-03 MED ORDER — FOLIC ACID 1 MG PO TABS: 1.0000 mg | ORAL_TABLET | Freq: Every day | ORAL | 0 refills | Status: AC

## 2018-01-03 MED ORDER — ENSURE ENLIVE PO LIQD: 237.0000 mL | Freq: Two times a day (BID) | ORAL | 12 refills | Status: DC

## 2018-01-03 MED ORDER — SENNOSIDES-DOCUSATE SODIUM 8.6-50 MG PO TABS: 1.0000 | ORAL_TABLET | Freq: Two times a day (BID) | ORAL | 0 refills | Status: DC

## 2018-01-03 MED ORDER — FERROUS SULFATE 325 (65 FE) MG PO TABS: 325.0000 mg | ORAL_TABLET | Freq: Two times a day (BID) | ORAL | 0 refills | Status: DC

## 2018-01-03 NOTE — Progress Notes (Signed)
3 attempts made to call report to receiving facility.  No one was available to take report.

## 2018-01-03 NOTE — Discharge Summary (Signed)
[facility] Discharge Summary   Patient: [name] ZHY:[id number]   PCP: Lemmie Evens, MD DOB: June 28, 1926   Date of admission: 12/28/2017   Date of discharge:  01/03/2018    Discharge Diagnoses:  Active Problems:   Fall   Anemia   Hip fracture (HCC)   Hip fracture, unspecified laterality, closed, initial encounter ([facility])   Encephalopathy   Displaced intertrochanteric fracture of left femur, initial encounter for closed fracture [facility])   Admitted From: home Disposition:  SNF  Recommendations for Outpatient Follow-up:  1. Please follow up with PCP and orthopedics as recommended     Contact information for follow-up providers    Mcarthur Rossetti, MD. Schedule an appointment as soon as possible for a visit in 2 week(s).   Specialty:  Orthopedic Surgery Contact information: [place] [address] [phone]            Contact information for after-discharge care    Destination    [facility] .   Service:  Skilled Nursing Contact information: [address] [phone]                 Diet recommendation: regular diet  Activity: The patient is advised to gradually reintroduce usual activities.  Discharge Condition: good  Code Status: full code  History of present illness: As per the H and P dictated on admission, " [name] is a 84 y.o. male with medical history significant of anemia, dementia, DM, renal insufficiency presenting w/ fall, hip fracture, encephalopathy, anemia, CKD. Level V caveat in setting of encephalopathy. Baseline dementia. Limited historian. Hard of hearing.  Per report, pt had fall at home. No reported weakness. Pt denies and head trauma, LOC. Denies any ETOH abuse.  Denies and CP or SOB. No abd pain.  ED Course: Presented to ER afebrile, hemodynamic stable. Hgb noted to be 7.6 (baseline around 8-10). Cr 1.8  (baseline around 2.4).  INR 1.16. Pt w/ subsequent Displaced comminuted intertrochanteric fracture L femur on pelvic plain films. CXR w/ no acute changes apart from small L pleural effusion. "  Hospital Course:  Summary of his active problems in the hospital is as following.  Mechanical fall with LEFT hip fracture   seen by orthopedics underwent surgical correction on 12/29/2017, does have perioperative blood loss related anemia on top of anemia of chronic disease, On aspirin for DVT prophylaxis per orthopedics, weightbearing as tolerated. Pt recommended SNF  Acute kidney injury on CKD stage V. Hyperkalemia resolved  Baseline creatinine around 2. Renal function worse due to anemia and blood loss, transfused 2 units on 12/31/2017, avoid nephrotoxins, Improved with IVF, now good PO intake. Treated with Kayexalate now better.   Anemia of chronic renal insufficiency along with perioperative blood loss related anemia. received 2 units of blood during surgery in the OR and another 2 units on 12/31/2017,  So far per staff no black stool or blood in stool reported Received IV Feraheme although unfortunately more than half of it extravasated. Unsure of the amount and therefore will hold off on further IV Feraheme and continue oral iron. He has seen [facility] in the past, may need follow up with them if remains anemic   History of Diabetes 2 - diet controlled Hemoglobin A1c 5.5. Glucose well controlled.  Hypertension   Adding Norvasc.  Acute encephalopathy on top of underlying moderate dementia and hearing loss.  Acute encephalopathy resolved. Supportive  care, PRN Haldol, minimize narcotics and benzodiazepines.  All other chronic medical condition were stable during the hospitalization.  Patient was seen by physical therapy, who recommended SNF, which was arranged by Education officer, museum and case Freight forwarder. On the day of the discharge the patient's vitals were stable , and no other acute medical  condition were reported by patient. the patient was felt safe to be discharge at SNF with therapy.  Consultants: orthopedics Procedures: ORIF  DISCHARGE MEDICATION: Allergies as of 01/03/2018   No Known Allergies     Medication List    STOP taking these medications   ALPRAZolam 1 MG tablet Commonly known as:  XANAX     TAKE these medications   amLODipine 5 MG tablet Commonly known as:  NORVASC Take 1 tablet (5 mg total) by mouth daily. Start taking on:  01/04/2018   aspirin 325 MG EC tablet Take 1 tablet (325 mg total) by mouth daily with breakfast.   calcium-vitamin D 500-200 MG-UNIT tablet Commonly known as:  OSCAL WITH D Take 1 tablet by mouth 2 (two) times daily.   feeding supplement (ENSURE ENLIVE) Liqd Take 237 mLs by mouth 2 (two) times daily between meals.   ferrous sulfate 325 (65 FE) MG tablet Take 1 tablet (325 mg total) by mouth 2 (two) times daily with a meal.   folic acid 1 MG tablet Commonly known as:  FOLVITE Take 1 tablet (1 mg total) by mouth daily. Start taking on:  01/04/2018   HYDROcodone-acetaminophen 5-325 MG tablet Commonly known as:  NORCO/VICODIN Take 1-2 tablets by mouth every 4 (four) hours as needed for moderate pain (pain score 4-6).   multivitamins ther. w/minerals Tabs tablet Take 1 tablet by mouth daily.   pantoprazole 40 MG tablet Commonly known as:  PROTONIX Take 40 mg by mouth 2 (two) times daily.   polyethylene glycol packet Commonly known as:  MIRALAX / GLYCOLAX Take 17 g by mouth daily. Start taking on:  01/04/2018   senna-docusate 8.6-50 MG tablet Commonly known as:  Senokot-S Take 1 tablet by mouth 2 (two) times daily.   tamsulosin 0.4 MG Caps capsule Commonly known as:  FLOMAX Take 1 capsule by mouth daily.      No Known Allergies Discharge Instructions    Diet - low sodium heart healthy   Complete by:  As directed    Discharge instructions   Complete by:  As directed    It is important that you read  following instructions as well as go over your medication list with RN to help you understand your care after this hospitalization.  Discharge Instructions: Please follow-up with PCP in one week  Please request your primary care physician to go over all Hospital Tests and Procedure/Radiological results at the follow up,  Please get all Hospital records sent to your PCP by signing hospital release before you go home.   Do not take more than prescribed Pain, Sleep and Anxiety Medications. You were cared for by a hospitalist during your hospital stay. If you have any questions about your discharge medications or the care you received while you were in the hospital after you are discharged, you can call the unit and ask to speak with the hospitalist on call if the hospitalist that took care of you is not available.  Once you are discharged, your primary care physician will handle any further medical issues. Please note that NO REFILLS for any discharge medications will be authorized once you are discharged, as it is  imperative that you return to your primary care physician (or establish a relationship with a primary care physician if you do not have one) for your aftercare needs so that they can reassess your need for medications and monitor your lab values. You Must read complete instructions/literature along with all the possible adverse reactions/side effects for all the Medicines you take and that have been prescribed to you. Take any new Medicines after you have completely understood and accept all the possible adverse reactions/side effects. Wear Seat belts while driving. If you have smoked or chewed Tobacco in the last 2 yrs please stop smoking and/or stop any Recreational drug use.   Increase activity slowly   Complete by:  As directed      Discharge Exam: Filed Weights   12/28/17 1817 12/31/17 1600  Weight: 73 kg (161 lb) 74 kg (163 lb 2.3 oz)   Vitals:   01/03/18 0636 01/03/18 1350    BP: (!) 185/74 (!) 148/54  Pulse: 96 94  Resp: 16 16  Temp: 98.6 F (37 C) 98.3 F (36.8 C)  SpO2: 96% 95%   General: Appear in mild distress, no Rash; Oral Mucosa moist. Cardiovascular: S1 and S2 Present, no Murmur, no JVD Respiratory: Bilateral Air entry present and Clear to Auscultation, no Crackles, no wheezes Abdomen: Bowel Sound present, Soft and no tenderness Extremities: no Pedal edema, no calf tenderness Neurology: Grossly no focal neuro deficit.  The results of significant diagnostics from this hospitalization (including imaging, microbiology, ancillary and laboratory) are listed below for reference.    Significant Diagnostic Studies: Dg Chest 1 View  Result Date: 12/28/2017 CLINICAL DATA:  [place] outside today, LEFT hip pain, LEFT hip fracture EXAM: CHEST  1 VIEW COMPARISON:  04/2017 FINDINGS: Upper normal heart size with slight vascular congestion. Mediastinal contours normal. Bibasilar atelectasis and small LEFT pleural effusion. Upper lungs clear. No definite infiltrate or pneumothorax. Bones demineralized with advanced degenerative changes of the RIGHT glenohumeral joint. IMPRESSION: Bibasilar atelectasis with small LEFT pleural effusion. Electronically Signed   By: Lavonia Dana M.D.   On: 12/28/2017 19:19   Dg C-arm 1-60 Min  Result Date: 12/29/2017 CLINICAL DATA:  Intraoperative imaging for fixation of a left hip fracture the patient suffered in a fall 12/28/2017. Initial encounter. EXAM: LEFT FEMUR 2 VIEWS; DG C-ARM 61-120 MIN COMPARISON:  Plain films left hip 12/28/2017. FINDINGS: Four fluoroscopic intraoperative spot views demonstrate placement of a long intramedullary nail with 2 screws across a comminuted intertrochanteric fracture and a single distal screw. Position and alignment are improved. No acute abnormality. IMPRESSION: Intraoperative imaging for fixation of a left intertrochanteric fracture. Electronically Signed   By: Inge Rise M.D.   On: 12/29/2017 11:58    Dg C-arm 1-60 Min  Result Date: 12/29/2017 CLINICAL DATA:  Intraoperative imaging for fixation of a left hip fracture the patient suffered in a fall 12/28/2017. Initial encounter. EXAM: LEFT FEMUR 2 VIEWS; DG C-ARM 61-120 MIN COMPARISON:  Plain films left hip 12/28/2017. FINDINGS: Four fluoroscopic intraoperative spot views demonstrate placement of a long intramedullary nail with 2 screws across a comminuted intertrochanteric fracture and a single distal screw. Position and alignment are improved. No acute abnormality. IMPRESSION: Intraoperative imaging for fixation of a left intertrochanteric fracture. Electronically Signed   By: Inge Rise M.D.   On: 12/29/2017 11:58   Dg Hip Unilat With Pelvis 2-3 Views Left  Result Date: 12/28/2017 CLINICAL DATA:  LEFT hip thigh pain post fall outside today EXAM: DG HIP (WITH OR WITHOUT  PELVIS) 2-3V LEFT COMPARISON:  None FINDINGS: Marked osseous demineralization. Comminuted displaced intertrochanteric fracture LEFT femur. No dislocation. Hip and SI joints preserved. BILATERAL pelvic surgical clips. IMPRESSION: Displaced comminuted intertrochanteric fracture LEFT femur. Electronically Signed   By: Lavonia Dana M.D.   On: 12/28/2017 19:17   Dg Femur Min 2 Views Left  Result Date: 12/29/2017 CLINICAL DATA:  Intraoperative imaging for fixation of a left hip fracture the patient suffered in a fall 12/28/2017. Initial encounter. EXAM: LEFT FEMUR 2 VIEWS; DG C-ARM 61-120 MIN COMPARISON:  Plain films left hip 12/28/2017. FINDINGS: Four fluoroscopic intraoperative spot views demonstrate placement of a long intramedullary nail with 2 screws across a comminuted intertrochanteric fracture and a single distal screw. Position and alignment are improved. No acute abnormality. IMPRESSION: Intraoperative imaging for fixation of a left intertrochanteric fracture. Electronically Signed   By: Inge Rise M.D.   On: 12/29/2017 11:58   Dg Femur Min 2 Views Left  Result Date:  12/28/2017 CLINICAL DATA:  [place] outside today, LEFT hip and thigh pain EXAM: LEFT FEMUR 2 VIEWS COMPARISON:  None FINDINGS: Hip imaged and reported separately. The mid to distal LEFT femur appears diffusely demineralized. No fracture of the mid to distal LEFT femur identified. Knee joint alignments normal with degenerative changes at the medial compartment. No knee joint effusion. Atherosclerotic calcifications at distal superficial femoral and popliteal arteries. IMPRESSION: No acute mid to distal LEFT femoral abnormalities. Please see dedicated LEFT hip radiographic exam for discussion of comminuted displaced intertrochanteric fracture. Electronically Signed   By: Lavonia Dana M.D.   On: 12/28/2017 19:18    Microbiology: Recent Results (from the past 240 hour(s))  MRSA PCR Screening     Status: None   Collection Time: 12/29/17  5:32 AM  Result Value Ref Range Status   MRSA by PCR NEGATIVE NEGATIVE Final    Comment:        The GeneXpert MRSA Assay (FDA approved for NASAL specimens only), is one component of a comprehensive MRSA colonization surveillance program. It is not intended to diagnose MRSA infection nor to guide or monitor treatment for MRSA infections. Performed at [facility], [place] [address], [place], [place] [address]      Labs: CBC: Recent Labs  Lab 12/28/17 1920 12/30/17 0429 12/31/17 0522 12/31/17 2138 01/01/18 0441 01/02/18 0336 01/03/18 0516  WBC 5.4 7.2 6.8  --  6.1 5.8 5.1  NEUTROABS 4.3  --   --   --   --   --   --   HGB 7.6* 7.6* 6.5* 9.7* 9.2* 8.9* 8.9*  HCT 24.4* 24.1* 21.2* 30.5* 28.8* 28.6* 28.4*  MCV 95.3 94.5 96.4  --  92.6 94.4 94.7  PLT 133* 94* 90*  --  103* 116* 300*   Basic Metabolic Panel: Recent Labs  Lab 12/30/17 0429 12/31/17 0522 01/01/18 0441 01/02/18 0336 01/03/18 0516  NA 144 142 142 140 142  K 5.5* 5.2* 4.5 4.7 4.5  CL 115* 113* 109 109 114*  CO2 25 27 20* 24 22  GLUCOSE 136* 102* 96 93 98  BUN 37* 46* 52* 48* 41*    CREATININE 2.37* 2.91* 2.71* 2.56* 1.81*  CALCIUM 8.2* 8.1* 8.1* 8.3* 8.4*   Liver Function Tests: Recent Labs  Lab 12/30/17 0429 12/31/17 0522 01/01/18 0441 01/02/18 0336  AST 22 19 20 18   ALT 9 5 <5 <5  ALKPHOS 53 56 55 52  BILITOT 0.7 0.6 0.9 0.9  PROT 5.2* 4.9* 5.4* 5.0*  ALBUMIN  2.6* 2.2* 2.2* 2.1*   No results for input(s): LIPASE, AMYLASE in the last 168 hours. No results for input(s): AMMONIA in the last 168 hours. Cardiac Enzymes: No results for input(s): CKTOTAL, CKMB, CKMBINDEX, TROPONINI in the last 168 hours. BNP (last 3 results) No results for input(s): BNP in the last 8760 hours. CBG: Recent Labs  Lab 12/28/17 2141 12/29/17 0835 12/29/17 1134 12/29/17 1640 01/01/18 0613  GLUCAP 119* 99 87 121* 91   Time spent: 35 minutes  Signed:  Berle Mull  [facility]  01/03/2018  , 2:01 PM

## 2018-01-03 NOTE — Social Work (Addendum)
CSW called SNF-[facility] to see if they received [employer].  CSW left message and will f/u.  Elissa Hefty, LCSW Clinical Social Worker [phone]

## 2018-01-03 NOTE — Clinical Social Work Placement (Signed)
   CLINICAL SOCIAL WORK PLACEMENT  NOTE  Date:  01/03/2018  Patient Details  Name: [name] MRN: [id number] Date of Birth: 03/01/1927  Clinical Social Work is seeking post-discharge placement for this patient at the [place] level of care (*CSW will initial, date and re-position this form in  chart as items are completed):  Yes   Patient/family provided with [facility] Work Department's list of facilities offering this level of care within the geographic area requested by the patient (or if unable, by the patient's family).  Yes   Patient/family informed of their freedom to choose among providers that offer the needed level of care, that participate in Medicare, Medicaid or managed care program needed by the patient, have an available bed and are willing to accept the patient.  Yes   Patient/family informed of [facility]'s ownership interest in [facility] and [facility], as well as of the fact that they are under no obligation to receive care at these facilities.  PASRR submitted to EDS on       PASRR number received on       Existing PASRR number confirmed on       FL2 transmitted to all facilities in geographic area requested by pt/family on       FL2 transmitted to all facilities within larger geographic area on       Patient informed that his/her managed care company has contracts with or will negotiate with certain facilities, including the following:        Yes   Patient/family informed of bed offers received.  Patient chooses bed at [facility]     Physician recommends and patient chooses bed at      Patient to be transferred to [facility] on 01/03/18.  Patient to be transferred to facility by [facility]     Patient family notified on 01/03/18 of transfer.  Name of family member notified:  son notified     PHYSICIAN       Additional Comment:     _______________________________________________ Normajean Baxter, LCSW 01/03/2018, 2:52 PM

## 2018-01-03 NOTE — Plan of Care (Signed)
Problem: Education: Goal: Knowledge of General Education information will improve 01/03/2018 1647 by Luis Muir, RN Outcome: Adequate for Discharge 01/03/2018 1232 by Luis Muir, RN Outcome: Progressing   Problem: Health Behavior/Discharge Planning: Goal: Ability to manage health-related needs will improve 01/03/2018 1647 by Luis Muir, RN Outcome: Adequate for Discharge 01/03/2018 1232 by Luis Muir, RN Outcome: Progressing   Problem: Clinical Measurements: Goal: Ability to maintain clinical measurements within normal limits will improve 01/03/2018 1647 by Luis Muir, RN Outcome: Adequate for Discharge 01/03/2018 1232 by Luis Muir, RN Outcome: Progressing Goal: Will remain free from infection 01/03/2018 1647 by Luis Muir, RN Outcome: Adequate for Discharge 01/03/2018 1232 by Luis Muir, RN Outcome: Progressing Goal: Diagnostic test results will improve 01/03/2018 1647 by Luis Muir, RN Outcome: Adequate for Discharge 01/03/2018 1232 by Luis Muir, RN Outcome: Progressing Goal: Respiratory complications will improve 01/03/2018 1647 by Luis Muir, RN Outcome: Adequate for Discharge 01/03/2018 1232 by Luis Muir, RN Outcome: Progressing Goal: Cardiovascular complication will be avoided 01/03/2018 1647 by Luis Muir, RN Outcome: Adequate for Discharge 01/03/2018 1232 by Luis Muir, RN Outcome: Progressing   Problem: Clinical Measurements: Goal: Will remain free from infection 01/03/2018 1647 by Luis Muir, RN Outcome: Adequate for Discharge 01/03/2018 1232 by Luis Muir, RN Outcome: Progressing   Problem: Clinical Measurements: Goal: Diagnostic test results will improve 01/03/2018 1647 by Luis Muir, RN Outcome: Adequate for Discharge 01/03/2018 1232 by Luis Muir, RN Outcome: Progressing   Problem: Clinical Measurements: Goal: Respiratory complications will improve 01/03/2018 1647 by Luis Muir, RN Outcome: Adequate for Discharge 01/03/2018 1232 by Luis Muir, RN Outcome: Progressing    Problem: Clinical Measurements: Goal: Cardiovascular complication will be avoided 01/03/2018 1647 by Luis Muir, RN Outcome: Adequate for Discharge 01/03/2018 1232 by Luis Muir, RN Outcome: Progressing   Problem: Clinical Measurements: Goal: Cardiovascular complication will be avoided 01/03/2018 1647 by Luis Muir, RN Outcome: Adequate for Discharge 01/03/2018 1232 by Luis Muir, RN Outcome: Progressing   Problem: Activity: Goal: Risk for activity intolerance will decrease 01/03/2018 1647 by Luis Muir, RN Outcome: Adequate for Discharge 01/03/2018 1232 by Luis Muir, RN Outcome: Progressing   Problem: Nutrition: Goal: Adequate nutrition will be maintained 01/03/2018 1647 by Luis Muir, RN Outcome: Adequate for Discharge 01/03/2018 1232 by Luis Muir, RN Outcome: Progressing   Problem: Coping: Goal: Level of anxiety will decrease 01/03/2018 1647 by Luis Muir, RN Outcome: Adequate for Discharge 01/03/2018 1232 by Luis Muir, RN Outcome: Progressing   Problem: Elimination: Goal: Will not experience complications related to bowel motility 01/03/2018 1647 by Luis Muir, RN Outcome: Adequate for Discharge 01/03/2018 1232 by Luis Muir, RN Outcome: Progressing Goal: Will not experience complications related to urinary retention 01/03/2018 1647 by Luis Muir, RN Outcome: Adequate for Discharge 01/03/2018 1232 by Luis Muir, RN Outcome: Progressing   Problem: Pain Managment: Goal: General experience of comfort will improve 01/03/2018 1647 by Luis Muir, RN Outcome: Adequate for Discharge 01/03/2018 1232 by Luis Muir, RN Outcome: Progressing   Problem: Safety: Goal: Ability to remain free from injury will improve 01/03/2018 1647 by Luis Muir, RN Outcome: Adequate for Discharge 01/03/2018 1232 by Luis Muir, RN Outcome: Progressing   Problem: Skin Integrity: Goal: Risk for impaired skin integrity will decrease 01/03/2018 1647 by Luis Muir, RN Outcome: Adequate for Discharge 01/03/2018 1232 by  Luis Muir, RN Outcome: Progressing   Problem: Education: Goal: Verbalization of understanding the information provided (i.e., activity precautions, restrictions, etc) will improve 01/03/2018 1647 by Luis Muir, RN Outcome: Adequate for Discharge 01/03/2018 1232  by Luis Muir, RN Outcome: Progressing   Problem: Activity: Goal: Ability to ambulate and perform ADLs will improve 01/03/2018 1647 by Luis Muir, RN Outcome: Adequate for Discharge 01/03/2018 1232 by Luis Muir, RN Outcome: Progressing   Problem: Clinical Measurements: Goal: Postoperative complications will be avoided or minimized 01/03/2018 1647 by Luis Muir, RN Outcome: Adequate for Discharge 01/03/2018 1232 by Luis Muir, RN Outcome: Progressing   Problem: Self-Concept: Goal: Ability to maintain and perform role responsibilities to the fullest extent possible will improve 01/03/2018 1647 by Luis Muir, RN Outcome: Adequate for Discharge 01/03/2018 1232 by Luis Muir, RN Outcome: Progressing   Problem: Pain Management: Goal: Pain level will decrease 01/03/2018 1647 by Luis Muir, RN Outcome: Adequate for Discharge 01/03/2018 1232 by Luis Muir, RN Outcome: Progressing

## 2018-01-03 NOTE — Plan of Care (Signed)
  Problem: Education: Goal: Knowledge of General Education information will improve Outcome: Progressing   Problem: Clinical Measurements: Goal: Ability to maintain clinical measurements within normal limits will improve Outcome: Progressing Goal: Will remain free from infection Outcome: Progressing Goal: Diagnostic test results will improve Outcome: Progressing Goal: Respiratory complications will improve Outcome: Progressing Goal: Cardiovascular complication will be avoided Outcome: Progressing   Problem: Activity: Goal: Risk for activity intolerance will decrease Outcome: Progressing   Problem: Safety: Goal: Ability to remain free from injury will improve Outcome: Progressing

## 2018-01-03 NOTE — Social Work (Signed)
Clinical Social Worker facilitated patient discharge including contacting patient family and facility to confirm patient discharge plans.  Clinical information faxed to facility and family agreeable with plan.    CSW arranged ambulance transport via [facility] to [facility].    RN to call [phone] to give report prior to discharge.  Clinical Social Worker will sign off for now as social work intervention is no longer needed. Please consult us again if new need arises.  Neriyah Cercone, LCSW Clinical Social Worker [phone]    

## 2018-01-20 ENCOUNTER — Ambulatory Visit (INDEPENDENT_AMBULATORY_CARE_PROVIDER_SITE_OTHER): Payer: Medicare Other | Admitting: Orthopaedic Surgery

## 2018-01-20 ENCOUNTER — Encounter (INDEPENDENT_AMBULATORY_CARE_PROVIDER_SITE_OTHER): Payer: Self-pay | Admitting: Orthopaedic Surgery

## 2018-01-20 ENCOUNTER — Ambulatory Visit (INDEPENDENT_AMBULATORY_CARE_PROVIDER_SITE_OTHER): Payer: Medicare Other

## 2018-01-20 DIAGNOSIS — Z8781 Personal history of (healed) traumatic fracture: Secondary | ICD-10-CM

## 2018-01-20 DIAGNOSIS — S72142A Displaced intertrochanteric fracture of left femur, initial encounter for closed fracture: Secondary | ICD-10-CM

## 2018-01-20 NOTE — Progress Notes (Signed)
The patient is an elderly 84 year old who is 22 days status post open reduction and fixation of a left intertrochanteric hip fracture.  He is been staying at a rehab facility.  He is been reluctant to put weight on that leg.  His family is with him today.  On exam all staples are intact and we remove these from his left hip and left knee area without any difficulty.  [place] Silos putting his hip the range of motion as well.  X-rays of the left hip shows intact hardware and interval healing of a complex intertrochanteric hip fracture.  At this point I gave notes for physical therapy at the facility to work on increasing his mobility with weightbearing as tolerated on the left hip.  He certainly a significant fall risk.  We will see him back in 4 weeks with a repeat 2 views of the left hip.

## 2018-02-19 ENCOUNTER — Ambulatory Visit (INDEPENDENT_AMBULATORY_CARE_PROVIDER_SITE_OTHER): Payer: Medicare Other | Admitting: Orthopaedic Surgery

## 2018-04-18 ENCOUNTER — Telehealth (INDEPENDENT_AMBULATORY_CARE_PROVIDER_SITE_OTHER): Payer: Self-pay | Admitting: Orthopaedic Surgery

## 2018-04-18 NOTE — Telephone Encounter (Signed)
LMOM for [facility] verbal orders

## 2018-04-18 NOTE — Telephone Encounter (Signed)
[facility]  ([place] [facility]   Once a week for one week  Twice a week a for four weeks

## 2018-04-22 ENCOUNTER — Emergency Department (HOSPITAL_COMMUNITY): Payer: Medicare Other

## 2018-04-22 ENCOUNTER — Inpatient Hospital Stay (HOSPITAL_COMMUNITY)
Admission: EM | Admit: 2018-04-22 | Discharge: 2018-05-04 | DRG: 871 | Disposition: A | Discharge status: Home Health | Payer: Medicare Other | Attending: Internal Medicine | Admitting: Internal Medicine

## 2018-04-22 ENCOUNTER — Other Ambulatory Visit: Payer: Self-pay

## 2018-04-22 DIAGNOSIS — R4182 Altered mental status, unspecified: Secondary | ICD-10-CM | POA: Diagnosis not present

## 2018-04-22 DIAGNOSIS — A411 Sepsis due to other specified staphylococcus: Principal | ICD-10-CM

## 2018-04-22 DIAGNOSIS — R945 Abnormal results of liver function studies: Secondary | ICD-10-CM | POA: Diagnosis present

## 2018-04-22 DIAGNOSIS — R1312 Dysphagia, oropharyngeal phase: Secondary | ICD-10-CM | POA: Diagnosis present

## 2018-04-22 DIAGNOSIS — N184 Chronic kidney disease, stage 4 (severe): Secondary | ICD-10-CM | POA: Diagnosis present

## 2018-04-22 DIAGNOSIS — I639 Cerebral infarction, unspecified: Secondary | ICD-10-CM

## 2018-04-22 DIAGNOSIS — N400 Enlarged prostate without lower urinary tract symptoms: Secondary | ICD-10-CM | POA: Diagnosis present

## 2018-04-22 DIAGNOSIS — I491 Atrial premature depolarization: Secondary | ICD-10-CM | POA: Diagnosis present

## 2018-04-22 DIAGNOSIS — Z7189 Other specified counseling: Secondary | ICD-10-CM

## 2018-04-22 DIAGNOSIS — R627 Adult failure to thrive: Secondary | ICD-10-CM

## 2018-04-22 DIAGNOSIS — N170 Acute kidney failure with tubular necrosis: Secondary | ICD-10-CM | POA: Diagnosis present

## 2018-04-22 DIAGNOSIS — H9190 Unspecified hearing loss, unspecified ear: Secondary | ICD-10-CM | POA: Diagnosis present

## 2018-04-22 DIAGNOSIS — Z79899 Other long term (current) drug therapy: Secondary | ICD-10-CM

## 2018-04-22 DIAGNOSIS — N179 Acute kidney failure, unspecified: Secondary | ICD-10-CM | POA: Diagnosis present

## 2018-04-22 DIAGNOSIS — Z7982 Long term (current) use of aspirin: Secondary | ICD-10-CM

## 2018-04-22 DIAGNOSIS — Z9889 Other specified postprocedural states: Secondary | ICD-10-CM

## 2018-04-22 DIAGNOSIS — J90 Pleural effusion, not elsewhere classified: Secondary | ICD-10-CM

## 2018-04-22 DIAGNOSIS — Z515 Encounter for palliative care: Secondary | ICD-10-CM

## 2018-04-22 DIAGNOSIS — K219 Gastro-esophageal reflux disease without esophagitis: Secondary | ICD-10-CM | POA: Diagnosis present

## 2018-04-22 DIAGNOSIS — I129 Hypertensive chronic kidney disease with stage 1 through stage 4 chronic kidney disease, or unspecified chronic kidney disease: Secondary | ICD-10-CM | POA: Diagnosis present

## 2018-04-22 DIAGNOSIS — I313 Pericardial effusion (noninflammatory): Secondary | ICD-10-CM | POA: Diagnosis not present

## 2018-04-22 DIAGNOSIS — Z86010 Personal history of colonic polyps: Secondary | ICD-10-CM

## 2018-04-22 DIAGNOSIS — Z8719 Personal history of other diseases of the digestive system: Secondary | ICD-10-CM

## 2018-04-22 DIAGNOSIS — D649 Anemia, unspecified: Secondary | ICD-10-CM | POA: Diagnosis present

## 2018-04-22 DIAGNOSIS — J690 Pneumonitis due to inhalation of food and vomit: Secondary | ICD-10-CM

## 2018-04-22 DIAGNOSIS — I4510 Unspecified right bundle-branch block: Secondary | ICD-10-CM | POA: Diagnosis present

## 2018-04-22 DIAGNOSIS — A419 Sepsis, unspecified organism: Secondary | ICD-10-CM | POA: Diagnosis present

## 2018-04-22 DIAGNOSIS — D631 Anemia in chronic kidney disease: Secondary | ICD-10-CM | POA: Diagnosis present

## 2018-04-22 DIAGNOSIS — N5089 Other specified disorders of the male genital organs: Secondary | ICD-10-CM | POA: Diagnosis not present

## 2018-04-22 DIAGNOSIS — I071 Rheumatic tricuspid insufficiency: Secondary | ICD-10-CM | POA: Diagnosis present

## 2018-04-22 DIAGNOSIS — E872 Acidosis: Secondary | ICD-10-CM | POA: Diagnosis present

## 2018-04-22 DIAGNOSIS — I10 Essential (primary) hypertension: Secondary | ICD-10-CM | POA: Diagnosis present

## 2018-04-22 DIAGNOSIS — E860 Dehydration: Secondary | ICD-10-CM | POA: Diagnosis present

## 2018-04-22 DIAGNOSIS — I63231 Cerebral infarction due to unspecified occlusion or stenosis of right carotid arteries: Secondary | ICD-10-CM | POA: Diagnosis not present

## 2018-04-22 DIAGNOSIS — E1122 Type 2 diabetes mellitus with diabetic chronic kidney disease: Secondary | ICD-10-CM | POA: Diagnosis present

## 2018-04-22 DIAGNOSIS — Z66 Do not resuscitate: Secondary | ICD-10-CM

## 2018-04-22 DIAGNOSIS — Z6823 Body mass index (BMI) 23.0-23.9, adult: Secondary | ICD-10-CM

## 2018-04-22 DIAGNOSIS — F0390 Unspecified dementia without behavioral disturbance: Secondary | ICD-10-CM | POA: Diagnosis present

## 2018-04-22 DIAGNOSIS — R6520 Severe sepsis without septic shock: Secondary | ICD-10-CM | POA: Diagnosis present

## 2018-04-22 DIAGNOSIS — E8809 Other disorders of plasma-protein metabolism, not elsewhere classified: Secondary | ICD-10-CM | POA: Diagnosis not present

## 2018-04-22 DIAGNOSIS — G9341 Metabolic encephalopathy: Secondary | ICD-10-CM

## 2018-04-22 DIAGNOSIS — E119 Type 2 diabetes mellitus without complications: Secondary | ICD-10-CM

## 2018-04-22 DIAGNOSIS — R410 Disorientation, unspecified: Secondary | ICD-10-CM

## 2018-04-22 DIAGNOSIS — E11649 Type 2 diabetes mellitus with hypoglycemia without coma: Secondary | ICD-10-CM | POA: Diagnosis present

## 2018-04-22 DIAGNOSIS — Z7401 Bed confinement status: Secondary | ICD-10-CM

## 2018-04-22 DIAGNOSIS — Z8781 Personal history of (healed) traumatic fracture: Secondary | ICD-10-CM

## 2018-04-22 DIAGNOSIS — K2270 Barrett's esophagus without dysplasia: Secondary | ICD-10-CM | POA: Diagnosis present

## 2018-04-22 DIAGNOSIS — N189 Chronic kidney disease, unspecified: Secondary | ICD-10-CM

## 2018-04-22 HISTORY — DX: Cerebral infarction, unspecified: I63.9

## 2018-04-22 LAB — CBG MONITORING, ED: Glucose-Capillary: 78 mg/dL (ref 70–99)

## 2018-04-22 MED ORDER — SODIUM CHLORIDE 0.9 % IV BOLUS (SEPSIS)
1000.0000 mL | Freq: Once | INTRAVENOUS | Status: AC
  Administered 2018-04-23: 1000 mL via INTRAVENOUS

## 2018-04-22 MED ORDER — SODIUM CHLORIDE 0.9 % IV SOLN: 1000.0000 mL | INTRAVENOUS | Status: DC

## 2018-04-22 MED ORDER — SODIUM CHLORIDE 0.9 % IV BOLUS (SEPSIS)
250.0000 mL | Freq: Once | INTRAVENOUS | Status: AC
  Administered 2018-04-23: 250 mL via INTRAVENOUS

## 2018-04-22 MED ORDER — DEXTROSE-NACL 5-0.9 % IV SOLN
INTRAVENOUS | Status: DC
  Administered 2018-04-23 – 2018-04-25 (×5): via INTRAVENOUS
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
## 2018-04-22 NOTE — ED Triage Notes (Signed)
Caregiver called EMS due to pt being found at home in bed unresponsive approx 1 hr ago, does not know how long he was unresponsive. EMS states CBG read 'low' and oral glucagon was given. CBG read 78 upon arrival to ED. Has bruising and skin tears on large portions of his body and notable pitting edema on lower extremities. EMS stated hx of renal failute and HTN.

## 2018-04-23 ENCOUNTER — Encounter (HOSPITAL_COMMUNITY): Payer: Self-pay

## 2018-04-23 ENCOUNTER — Emergency Department (HOSPITAL_COMMUNITY): Payer: Medicare Other

## 2018-04-23 ENCOUNTER — Inpatient Hospital Stay (HOSPITAL_COMMUNITY): Payer: Medicare Other

## 2018-04-23 DIAGNOSIS — R4182 Altered mental status, unspecified: Secondary | ICD-10-CM | POA: Diagnosis present

## 2018-04-23 DIAGNOSIS — F0390 Unspecified dementia without behavioral disturbance: Secondary | ICD-10-CM | POA: Diagnosis present

## 2018-04-23 DIAGNOSIS — E860 Dehydration: Secondary | ICD-10-CM | POA: Diagnosis present

## 2018-04-23 DIAGNOSIS — Z7189 Other specified counseling: Secondary | ICD-10-CM | POA: Diagnosis not present

## 2018-04-23 DIAGNOSIS — I313 Pericardial effusion (noninflammatory): Secondary | ICD-10-CM | POA: Diagnosis not present

## 2018-04-23 DIAGNOSIS — Z515 Encounter for palliative care: Secondary | ICD-10-CM | POA: Diagnosis not present

## 2018-04-23 DIAGNOSIS — Z66 Do not resuscitate: Secondary | ICD-10-CM | POA: Diagnosis not present

## 2018-04-23 DIAGNOSIS — E11649 Type 2 diabetes mellitus with hypoglycemia without coma: Secondary | ICD-10-CM | POA: Diagnosis present

## 2018-04-23 DIAGNOSIS — A419 Sepsis, unspecified organism: Secondary | ICD-10-CM

## 2018-04-23 DIAGNOSIS — R6520 Severe sepsis without septic shock: Secondary | ICD-10-CM

## 2018-04-23 DIAGNOSIS — N400 Enlarged prostate without lower urinary tract symptoms: Secondary | ICD-10-CM | POA: Diagnosis present

## 2018-04-23 DIAGNOSIS — I361 Nonrheumatic tricuspid (valve) insufficiency: Secondary | ICD-10-CM | POA: Diagnosis not present

## 2018-04-23 DIAGNOSIS — A411 Sepsis due to other specified staphylococcus: Secondary | ICD-10-CM | POA: Diagnosis present

## 2018-04-23 DIAGNOSIS — I4510 Unspecified right bundle-branch block: Secondary | ICD-10-CM | POA: Diagnosis present

## 2018-04-23 DIAGNOSIS — I63231 Cerebral infarction due to unspecified occlusion or stenosis of right carotid arteries: Secondary | ICD-10-CM | POA: Diagnosis not present

## 2018-04-23 DIAGNOSIS — I639 Cerebral infarction, unspecified: Secondary | ICD-10-CM | POA: Diagnosis not present

## 2018-04-23 DIAGNOSIS — E1122 Type 2 diabetes mellitus with diabetic chronic kidney disease: Secondary | ICD-10-CM | POA: Diagnosis not present

## 2018-04-23 DIAGNOSIS — R410 Disorientation, unspecified: Secondary | ICD-10-CM

## 2018-04-23 DIAGNOSIS — N184 Chronic kidney disease, stage 4 (severe): Secondary | ICD-10-CM | POA: Diagnosis not present

## 2018-04-23 DIAGNOSIS — N179 Acute kidney failure, unspecified: Secondary | ICD-10-CM | POA: Diagnosis not present

## 2018-04-23 DIAGNOSIS — E8809 Other disorders of plasma-protein metabolism, not elsewhere classified: Secondary | ICD-10-CM | POA: Diagnosis not present

## 2018-04-23 DIAGNOSIS — E872 Acidosis: Secondary | ICD-10-CM | POA: Diagnosis present

## 2018-04-23 DIAGNOSIS — I129 Hypertensive chronic kidney disease with stage 1 through stage 4 chronic kidney disease, or unspecified chronic kidney disease: Secondary | ICD-10-CM | POA: Diagnosis present

## 2018-04-23 DIAGNOSIS — G9341 Metabolic encephalopathy: Secondary | ICD-10-CM | POA: Diagnosis present

## 2018-04-23 DIAGNOSIS — J90 Pleural effusion, not elsewhere classified: Secondary | ICD-10-CM | POA: Diagnosis not present

## 2018-04-23 DIAGNOSIS — R627 Adult failure to thrive: Secondary | ICD-10-CM | POA: Diagnosis present

## 2018-04-23 DIAGNOSIS — N170 Acute kidney failure with tubular necrosis: Secondary | ICD-10-CM | POA: Diagnosis present

## 2018-04-23 DIAGNOSIS — D631 Anemia in chronic kidney disease: Secondary | ICD-10-CM | POA: Diagnosis present

## 2018-04-23 DIAGNOSIS — I491 Atrial premature depolarization: Secondary | ICD-10-CM | POA: Diagnosis present

## 2018-04-23 DIAGNOSIS — I071 Rheumatic tricuspid insufficiency: Secondary | ICD-10-CM | POA: Diagnosis present

## 2018-04-23 DIAGNOSIS — J690 Pneumonitis due to inhalation of food and vomit: Secondary | ICD-10-CM | POA: Diagnosis present

## 2018-04-23 DIAGNOSIS — D649 Anemia, unspecified: Secondary | ICD-10-CM | POA: Diagnosis not present

## 2018-04-23 LAB — COMPREHENSIVE METABOLIC PANEL
ALK PHOS: 93 U/L (ref 38–126)
ALT: 30 U/L (ref 0–44)
ALT: 35 U/L (ref 0–44)
AST: 60 U/L — AB (ref 15–41)
AST: 73 U/L — ABNORMAL HIGH (ref 15–41)
Albumin: 2.6 g/dL — ABNORMAL LOW (ref 3.5–5.0)
Albumin: 3.1 g/dL — ABNORMAL LOW (ref 3.5–5.0)
Alkaline Phosphatase: 76 U/L (ref 38–126)
Anion gap: 11 (ref 5–15)
Anion gap: 8 (ref 5–15)
BILIRUBIN TOTAL: 1.1 mg/dL (ref 0.3–1.2)
BUN: 66 mg/dL — ABNORMAL HIGH (ref 8–23)
BUN: 74 mg/dL — ABNORMAL HIGH (ref 8–23)
CALCIUM: 8.5 mg/dL — AB (ref 8.9–10.3)
CO2: 11 mmol/L — AB (ref 22–32)
CO2: 11 mmol/L — ABNORMAL LOW (ref 22–32)
CREATININE: 3.44 mg/dL — AB (ref 0.61–1.24)
Calcium: 7.9 mg/dL — ABNORMAL LOW (ref 8.9–10.3)
Chloride: 117 mmol/L — ABNORMAL HIGH (ref 98–111)
Chloride: 123 mmol/L — ABNORMAL HIGH (ref 98–111)
Creatinine, Ser: 3.77 mg/dL — ABNORMAL HIGH (ref 0.61–1.24)
GFR calc non Af Amer: 13 mL/min — ABNORMAL LOW (ref >60)
GFR calc non Af Amer: 14 mL/min — ABNORMAL LOW (ref >60)
GFR, EST AFRICAN AMERICAN: 15 mL/min — AB (ref >60)
GFR, EST AFRICAN AMERICAN: 17 mL/min — AB (ref >60)
GLUCOSE: 97 mg/dL (ref 70–99)
Glucose, Bld: 103 mg/dL — ABNORMAL HIGH (ref 70–99)
Potassium: 5 mmol/L (ref 3.5–5.1)
Potassium: 5.4 mmol/L — ABNORMAL HIGH (ref 3.5–5.1)
SODIUM: 139 mmol/L (ref 135–145)
SODIUM: 142 mmol/L (ref 135–145)
Total Bilirubin: 0.8 mg/dL (ref 0.3–1.2)
Total Protein: 5.6 g/dL — ABNORMAL LOW (ref 6.5–8.1)
Total Protein: 6.8 g/dL (ref 6.5–8.1)

## 2018-04-23 LAB — URINALYSIS, ROUTINE W REFLEX MICROSCOPIC
Bilirubin Urine: NEGATIVE
Glucose, UA: NEGATIVE mg/dL
KETONES UR: 5 mg/dL — AB
LEUKOCYTES UA: NEGATIVE
NITRITE: NEGATIVE
PH: 5 (ref 5.0–8.0)
Protein, ur: NEGATIVE mg/dL
Specific Gravity, Urine: 1.011 (ref 1.005–1.030)

## 2018-04-23 LAB — TROPONIN I
TROPONIN I: 0.33 ng/mL — AB (ref <0.03)
TROPONIN I: 0.34 ng/mL — AB (ref <0.03)
Troponin I: 0.26 ng/mL (ref <0.03)

## 2018-04-23 LAB — IRON AND TIBC
Iron: 7 ug/dL — ABNORMAL LOW (ref 45–182)
SATURATION RATIOS: 4 % — AB (ref 17.9–39.5)
TIBC: 187 ug/dL — ABNORMAL LOW (ref 250–450)
UIBC: 180 ug/dL

## 2018-04-23 LAB — BLOOD GAS, ARTERIAL
Acid-Base Excess: 15.3 mmol/L — ABNORMAL HIGH (ref 0.0–2.0)
BICARBONATE: 12.4 mmol/L — AB (ref 20.0–28.0)
DRAWN BY: 317771
O2 CONTENT: 3 L/min
O2 Saturation: 89.9 %
pCO2 arterial: 22 mmHg — ABNORMAL LOW (ref 32.0–48.0)
pH, Arterial: 7.286 — ABNORMAL LOW (ref 7.350–7.450)
pO2, Arterial: 64.2 mmHg — ABNORMAL LOW (ref 83.0–108.0)

## 2018-04-23 LAB — CBC WITH DIFFERENTIAL/PLATELET
Abs Immature Granulocytes: 0.09 10*3/uL — ABNORMAL HIGH (ref 0.00–0.07)
Basophils Absolute: 0 10*3/uL (ref 0.0–0.1)
Basophils Relative: 0 %
Eosinophils Absolute: 0 10*3/uL (ref 0.0–0.5)
Eosinophils Relative: 0 %
HEMATOCRIT: 22.7 % — AB (ref 39.0–52.0)
HEMOGLOBIN: 6.6 g/dL — AB (ref 13.0–17.0)
IMMATURE GRANULOCYTES: 1 %
LYMPHS ABS: 0.3 10*3/uL — AB (ref 0.7–4.0)
Lymphocytes Relative: 3 %
MCH: 29.5 pg (ref 26.0–34.0)
MCHC: 29.1 g/dL — ABNORMAL LOW (ref 30.0–36.0)
MCV: 101.3 fL — ABNORMAL HIGH (ref 80.0–100.0)
MONOS PCT: 4 %
Monocytes Absolute: 0.5 10*3/uL (ref 0.1–1.0)
NEUTROS ABS: 11.4 10*3/uL — AB (ref 1.7–7.7)
NEUTROS PCT: 92 %
Platelets: 199 10*3/uL (ref 150–400)
RBC: 2.24 MIL/uL — ABNORMAL LOW (ref 4.22–5.81)
RDW: 16.5 % — ABNORMAL HIGH (ref 11.5–15.5)
WBC: 12.3 10*3/uL — ABNORMAL HIGH (ref 4.0–10.5)
nRBC: 0 % (ref 0.0–0.2)

## 2018-04-23 LAB — INFLUENZA PANEL BY PCR (TYPE A & B)
Influenza A By PCR: NEGATIVE
Influenza B By PCR: NEGATIVE

## 2018-04-23 LAB — CK TOTAL AND CKMB (NOT AT ARMC)
CK TOTAL: 722 U/L — AB (ref 49–397)
CK, MB: 9.5 ng/mL — ABNORMAL HIGH (ref 0.5–5.0)
Relative Index: 1.3 (ref 0.0–2.5)

## 2018-04-23 LAB — ECHOCARDIOGRAM COMPLETE: Weight: 2464 oz

## 2018-04-23 LAB — CBC
HCT: 21.2 % — ABNORMAL LOW (ref 39.0–52.0)
Hemoglobin: 6.1 g/dL — CL (ref 13.0–17.0)
MCH: 29.5 pg (ref 26.0–34.0)
MCHC: 28.8 g/dL — ABNORMAL LOW (ref 30.0–36.0)
MCV: 102.4 fL — ABNORMAL HIGH (ref 80.0–100.0)
NRBC: 0 % (ref 0.0–0.2)
PLATELETS: 171 10*3/uL (ref 150–400)
RBC: 2.07 MIL/uL — AB (ref 4.22–5.81)
RDW: 16.6 % — ABNORMAL HIGH (ref 11.5–15.5)
WBC: 10.8 10*3/uL — ABNORMAL HIGH (ref 4.0–10.5)

## 2018-04-23 LAB — GLUCOSE, CAPILLARY
Glucose-Capillary: 75 mg/dL (ref 70–99)
Glucose-Capillary: 107 mg/dL — ABNORMAL HIGH (ref 70–99)

## 2018-04-23 LAB — TSH: TSH: 0.662 u[IU]/mL (ref 0.350–4.500)

## 2018-04-23 LAB — PREPARE RBC (CROSSMATCH)

## 2018-04-23 LAB — MAGNESIUM: Magnesium: 2 mg/dL (ref 1.7–2.4)

## 2018-04-23 LAB — CBG MONITORING, ED
GLUCOSE-CAPILLARY: 87 mg/dL (ref 70–99)
Glucose-Capillary: 100 mg/dL — ABNORMAL HIGH (ref 70–99)
Glucose-Capillary: 85 mg/dL (ref 70–99)

## 2018-04-23 LAB — VITAMIN B12: Vitamin B-12: 1647 pg/mL — ABNORMAL HIGH (ref 180–914)

## 2018-04-23 LAB — ABO/RH: ABO/RH(D): O POS

## 2018-04-23 LAB — FERRITIN: Ferritin: 838 ng/mL — ABNORMAL HIGH (ref 24–336)

## 2018-04-23 LAB — PSA: Prostatic Specific Antigen: <0.01 ng/mL (ref 0.00–4.00)

## 2018-04-23 LAB — LACTIC ACID, PLASMA
LACTIC ACID, VENOUS: 1 mmol/L (ref 0.5–1.9)
Lactic Acid, Venous: 1.1 mmol/L (ref 0.5–1.9)

## 2018-04-23 LAB — MRSA PCR SCREENING: MRSA BY PCR: NEGATIVE

## 2018-04-23 LAB — FOLATE: Folate: 52.8 ng/mL (ref >5.9)

## 2018-04-23 MED ORDER — CALCIUM GLUCONATE-NACL 1-0.675 GM/50ML-% IV SOLN
1.0000 g | Freq: Once | INTRAVENOUS | Status: AC
  Administered 2018-04-23: 1000 mg via INTRAVENOUS
  Filled 2018-04-23: qty 50

## 2018-04-23 MED ORDER — INSULIN ASPART 100 UNIT/ML ~~LOC~~ SOLN
0.0000 [IU] | SUBCUTANEOUS | Status: DC
  Administered 2018-04-24 – 2018-04-26 (×3): 1 [IU] via SUBCUTANEOUS
  Administered 2018-04-27: 2 [IU] via SUBCUTANEOUS
  Administered 2018-04-27 (×2): 1 [IU] via SUBCUTANEOUS
  Administered 2018-04-28: 2 [IU] via SUBCUTANEOUS
  Administered 2018-04-28 – 2018-04-30 (×6): 1 [IU] via SUBCUTANEOUS

## 2018-04-23 MED ORDER — SODIUM CHLORIDE 0.9 % IV SOLN
2.0000 g | Freq: Once | INTRAVENOUS | Status: AC
  Administered 2018-04-23: 2 g via INTRAVENOUS
  Filled 2018-04-23: qty 2

## 2018-04-23 MED ORDER — TAMSULOSIN HCL 0.4 MG PO CAPS
0.4000 mg | ORAL_CAPSULE | Freq: Every day | ORAL | Status: DC
  Administered 2018-04-24 – 2018-05-04 (×11): 0.4 mg via ORAL
  Filled 2018-04-23 (×11): qty 1

## 2018-04-23 MED ORDER — FOLIC ACID 1 MG PO TABS
1.0000 mg | ORAL_TABLET | Freq: Every day | ORAL | Status: DC
  Administered 2018-04-24 – 2018-05-04 (×11): 1 mg via ORAL
  Filled 2018-04-23 (×11): qty 1

## 2018-04-23 MED ORDER — SODIUM CHLORIDE 0.9% IV SOLUTION
Freq: Once | INTRAVENOUS | Status: AC
  Administered 2018-04-23: 10:00:00 via INTRAVENOUS

## 2018-04-23 MED ORDER — ASPIRIN EC 325 MG PO TBEC
325.0000 mg | DELAYED_RELEASE_TABLET | Freq: Every day | ORAL | Status: DC
  Administered 2018-04-24 – 2018-05-04 (×11): 325 mg via ORAL
  Filled 2018-04-23 (×11): qty 1

## 2018-04-23 MED ORDER — PANTOPRAZOLE SODIUM 40 MG PO TBEC
40.0000 mg | DELAYED_RELEASE_TABLET | Freq: Two times a day (BID) | ORAL | Status: DC
  Administered 2018-04-23 – 2018-05-04 (×22): 40 mg via ORAL
  Filled 2018-04-23 (×22): qty 1

## 2018-04-23 MED ORDER — FERROUS SULFATE 325 (65 FE) MG PO TABS
325.0000 mg | ORAL_TABLET | Freq: Two times a day (BID) | ORAL | Status: DC
  Administered 2018-04-23 – 2018-05-04 (×21): 325 mg via ORAL
  Filled 2018-04-23 (×25): qty 1

## 2018-04-23 MED ORDER — CARVEDILOL 3.125 MG PO TABS
3.1250 mg | ORAL_TABLET | Freq: Two times a day (BID) | ORAL | Status: DC
  Administered 2018-04-23 – 2018-04-26 (×6): 3.125 mg via ORAL
  Filled 2018-04-23 (×10): qty 1

## 2018-04-23 MED ORDER — VANCOMYCIN HCL IN DEXTROSE 1-5 GM/200ML-% IV SOLN: 1000.0000 mg | INTRAVENOUS | Status: DC

## 2018-04-23 MED ORDER — SODIUM CHLORIDE 0.9 % IV SOLN
1.0000 g | INTRAVENOUS | Status: DC
  Administered 2018-04-24: 1 g via INTRAVENOUS
  Filled 2018-04-23 (×2): qty 1

## 2018-04-23 MED ORDER — ACETAMINOPHEN 325 MG PO TABS
650.0000 mg | ORAL_TABLET | Freq: Once | ORAL | Status: AC
  Administered 2018-04-23: 650 mg via ORAL
  Filled 2018-04-23: qty 2

## 2018-04-23 MED ORDER — VANCOMYCIN HCL 10 G IV SOLR
1500.0000 mg | Freq: Once | INTRAVENOUS | Status: AC
  Administered 2018-04-23: 1500 mg via INTRAVENOUS
  Filled 2018-04-23 (×2): qty 1500

## 2018-04-23 MED ORDER — AMLODIPINE BESYLATE 5 MG PO TABS
5.0000 mg | ORAL_TABLET | Freq: Every day | ORAL | Status: DC
  Administered 2018-04-24 – 2018-05-04 (×11): 5 mg via ORAL
  Filled 2018-04-23 (×11): qty 1

## 2018-04-23 MED ORDER — SENNOSIDES-DOCUSATE SODIUM 8.6-50 MG PO TABS
1.0000 | ORAL_TABLET | Freq: Two times a day (BID) | ORAL | Status: DC
  Administered 2018-04-23 – 2018-05-04 (×22): 1 via ORAL
  Filled 2018-04-23 (×27): qty 1

## 2018-04-23 MED ORDER — POLYETHYLENE GLYCOL 3350 17 G PO PACK
17.0000 g | PACK | Freq: Every day | ORAL | Status: DC
  Administered 2018-04-24 – 2018-05-04 (×11): 17 g via ORAL
  Filled 2018-04-23 (×11): qty 1

## 2018-04-23 MED ORDER — DIPHENHYDRAMINE HCL 50 MG/ML IJ SOLN
25.0000 mg | Freq: Once | INTRAMUSCULAR | Status: AC
  Administered 2018-04-23: 25 mg via INTRAVENOUS
  Filled 2018-04-23: qty 1

## 2018-04-23 NOTE — Progress Notes (Signed)
Echocardiogram 2D Echocardiogram has been performed.  [name] 04/23/2018, 2:56 PM
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
## 2018-04-23 NOTE — ED Notes (Signed)
Bladder scanner shows approx 77mL of urine.

## 2018-04-23 NOTE — ED Notes (Signed)
Attempted to contact family in regards to informed consent about blood administration. Unable to get in contact with son whom pt lives with. Left message to call ASAP. Agricultural consultant notified.

## 2018-04-23 NOTE — ED Notes (Signed)
Date and time results received: 04/23/18 0741  Test: Hgb Critical Value: 6.1  Name of Provider Notified: Dr. Dyann Kief   Orders Received? Or Actions Taken?: See chart

## 2018-04-23 NOTE — ED Notes (Addendum)
Date and time results received: 04/23/18 2:26 AM  (use smartphrase ".now" to insert current time)  Test: troponin Critical Value: 0.34  Name of Provider Notified: Dr Maudie Mercury  Orders Received? Or Actions Taken?: MD notified

## 2018-04-23 NOTE — ED Notes (Signed)
Spoke with pt's son and POA. Received verbal consent for blood administration.

## 2018-04-23 NOTE — ED Notes (Signed)
Accidentally released both units of blood. Only will pull one at this time.

## 2018-04-23 NOTE — Progress Notes (Signed)
Patient seen and examined.  Admitted secondary to acute anemia, altered mental status and fever.  No source of acute infection has been isolated yet; patient with a history of dementia is a high risk for potential aspiration process and there are changes on his x-ray that might support these thoughts.  His hemoglobin down to 6.1 and no signs of acute bleeding appreciated. Most likely in the setting of chronic renal disease. Will transfuse as ordered and follow trend.  Between elevated temperature and abnormal CBGs this can be accountable for his mentation changes.  Provide supportive care, follow CBGs closely for now continue broad-spectrum antibiotics. Please refer to H&P written by Dr. Maudie Mercury for further info/details on admission.   Barton Dubois MD [phone]

## 2018-04-23 NOTE — Progress Notes (Signed)
Pharmacy Antibiotic Note  [name] is a 84 y.o. male admitted on 04/22/2018 with sepsis and UTI.  Pharmacy has been consulted for Vancomycin and Cefepime dosing.  Plan: Vancomycin 1500 mg IV x 1 dose. Vancomycin 1000 mg IV every 48 hours.  Goal trough 15-20 mcg/mL.  Cefepime 1000 mg IV every 24 hours. Monitor labs, c/s, and vanco trough as indicated.  Weight: 154 lb (69.9 kg)  Temp (24hrs), Avg:100.8 F (38.2 C), Min:98.1 F (36.7 C), Max:103.4 F (39.7 C)  Recent Labs  Lab 04/22/18 2350 04/22/18 2352 04/23/18 0153  WBC 12.3*  --   --   CREATININE 3.77*  --   --   LATICACIDVEN  --  1.1 1.0    Estimated Creatinine Clearance: 12 mL/min (A) (by C-G formula based on SCr of 3.77 mg/dL (H)).    No Known Allergies  Antimicrobials this admission: Cefepime 10/30 >>  Vanco 10/30 >>   Dose adjustments this admission: Vanco/Cefepime  Microbiology results: 10/30 BCx: pending 10/30 UCx: pending   Thank you for allowing pharmacy to be a part of this patient's care.  Ramond Craver 04/23/2018 7:40 AM

## 2018-04-23 NOTE — ED Notes (Signed)
Phlebotomy at bedside.

## 2018-04-23 NOTE — Progress Notes (Signed)
CRITICAL VALUE ALERT  Critical Value:  Two blood cultures (one anaerobic and one aerobic bottle) both with gram positive cocci  Date & Time Notied:  04/23/18 @1705   Provider Notified: Dr. Dyann Kief  Orders Received/Actions taken: No new orders currently

## 2018-04-23 NOTE — Progress Notes (Signed)
ANTIBIOTIC CONSULT NOTE-Preliminary  Pharmacy Consult for Cefepime and Vancomycin Indication: Sepsis  No Known Allergies  Patient Measurements: Weight: 154 lb (69.9 kg)  Vital Signs: Temp: 103.4 F (39.7 C) (10/29 2321) Temp Source: Rectal (10/29 2321) BP: 109/41 (10/30 0430) Pulse Rate: 110 (10/30 0430)  Labs: Recent Labs    04/22/18 2350  WBC 12.3*  HGB 6.6*  PLT 199  CREATININE 3.77*    Estimated Creatinine Clearance: 12 mL/min (A) (by C-G formula based on SCr of 3.77 mg/dL (H)).  No results for input(s): VANCOTROUGH, VANCOPEAK, VANCORANDOM, GENTTROUGH, GENTPEAK, GENTRANDOM, TOBRATROUGH, TOBRAPEAK, TOBRARND, AMIKACINPEAK, AMIKACINTROU, AMIKACIN in the last 72 hours.   Microbiology: Recent Results (from the past 720 hour(s))  Blood Culture (routine x 2)     Status: None (Preliminary result)   Collection Time: 04/22/18 11:51 PM  Result Value Ref Range Status   Specimen Description BLOOD LEFT FOREARM  Final   Special Requests   Final    BOTTLES DRAWN AEROBIC AND ANAEROBIC Blood Culture adequate volume Performed at [facility], [address], [place], [place] [address]    Culture PENDING  Incomplete   Report Status PENDING  Incomplete  Blood Culture (routine x 2)     Status: None (Preliminary result)   Collection Time: 04/22/18 11:52 PM  Result Value Ref Range Status   Specimen Description BLOOD RIGHT HAND  Final   Special Requests   Final    BOTTLES DRAWN AEROBIC AND ANAEROBIC Blood Culture adequate volume Performed at [facility], [address], [place], [place] [address]    Culture PENDING  Incomplete   Report Status PENDING  Incomplete    Medical History: Past Medical History:  Diagnosis Date  . Anemia   . Arthritis   . Barrett's esophagus 05/14/2005   EGD Dr Sharlett Iles, gastritis  . Chewing tobacco nicotine dependence without complication   . Colon polyps 05/14/2005   Dr Vernetta Honey path avail at this time  . Dementia ([place]) 04/19/2011  .  Diabetes mellitus   . Renal insufficiency   . S/P colonoscopy 05/14/2005   colonic avm cauterized  . Shortness of breath     Medications:  Cefepime 2 Gm IV x 1 dose per EDP  Assessment: 84 yo male seen in the ED for altered mental status, hypoglycemia and fever, and ARF. Sepsis protocol initiated. Pharmacy has been consulted for Vancomycin dosing.  Goal of Therapy:  Vancomycin troughs 15-20 mcg/ml Eradicate infection  Plan:  Preliminary review of pertinent patient information completed.  Protocol will be initiated with a loading dose of Vancomycin 1500 mg IV.  Forestine Na clinical pharmacist will complete review during morning rounds to assess patient and finalize treatment regimen if needed.  Norberto Sorenson, [facility] 04/23/2018,4:42 AM

## 2018-04-23 NOTE — ED Notes (Signed)
CRITICAL VALUE ALERT  Critical Value:  hemaglobin 6.6  Date & Time Notied:  04/23/18 0029  Provider Notified: dr.[email or web address]  Orders Received/Actions taken: md notified.

## 2018-04-24 DIAGNOSIS — K219 Gastro-esophageal reflux disease without esophagitis: Secondary | ICD-10-CM

## 2018-04-24 LAB — GLUCOSE, CAPILLARY
GLUCOSE-CAPILLARY: 140 mg/dL — AB (ref 70–99)
GLUCOSE-CAPILLARY: 89 mg/dL (ref 70–99)
GLUCOSE-CAPILLARY: 95 mg/dL (ref 70–99)
GLUCOSE-CAPILLARY: 98 mg/dL (ref 70–99)
Glucose-Capillary: 103 mg/dL — ABNORMAL HIGH (ref 70–99)
Glucose-Capillary: 111 mg/dL — ABNORMAL HIGH (ref 70–99)

## 2018-04-24 LAB — BPAM RBC
BLOOD PRODUCT EXPIRATION DATE: 201912022359
Blood Product Expiration Date: 201912012359
ISSUE DATE / TIME: 201910300948
ISSUE DATE / TIME: 201910301413
UNIT TYPE AND RH: 5100
Unit Type and Rh: 5100

## 2018-04-24 LAB — BLOOD CULTURE ID PANEL (REFLEXED)
ACINETOBACTER BAUMANNII: NOT DETECTED
CANDIDA ALBICANS: NOT DETECTED
CANDIDA PARAPSILOSIS: NOT DETECTED
CANDIDA TROPICALIS: NOT DETECTED
Candida glabrata: NOT DETECTED
Candida krusei: NOT DETECTED
ENTEROBACTERIACEAE SPECIES: NOT DETECTED
Enterobacter cloacae complex: NOT DETECTED
Enterococcus species: NOT DETECTED
Escherichia coli: NOT DETECTED
HAEMOPHILUS INFLUENZAE: NOT DETECTED
KLEBSIELLA OXYTOCA: NOT DETECTED
Klebsiella pneumoniae: NOT DETECTED
Listeria monocytogenes: NOT DETECTED
NEISSERIA MENINGITIDIS: NOT DETECTED
Proteus species: NOT DETECTED
Pseudomonas aeruginosa: NOT DETECTED
SERRATIA MARCESCENS: NOT DETECTED
STAPHYLOCOCCUS AUREUS BCID: NOT DETECTED
STREPTOCOCCUS AGALACTIAE: NOT DETECTED
STREPTOCOCCUS SPECIES: NOT DETECTED
Staphylococcus species: NOT DETECTED
Streptococcus pneumoniae: NOT DETECTED
Streptococcus pyogenes: NOT DETECTED

## 2018-04-24 LAB — BASIC METABOLIC PANEL
Anion gap: 6 (ref 5–15)
BUN: 59 mg/dL — AB (ref 8–23)
CALCIUM: 8.1 mg/dL — AB (ref 8.9–10.3)
CO2: 12 mmol/L — ABNORMAL LOW (ref 22–32)
CREATININE: 3.14 mg/dL — AB (ref 0.61–1.24)
Chloride: 122 mmol/L — ABNORMAL HIGH (ref 98–111)
GFR calc Af Amer: 19 mL/min — ABNORMAL LOW (ref >60)
GFR calc non Af Amer: 16 mL/min — ABNORMAL LOW (ref >60)
Glucose, Bld: 114 mg/dL — ABNORMAL HIGH (ref 70–99)
Potassium: 4.6 mmol/L (ref 3.5–5.1)
SODIUM: 140 mmol/L (ref 135–145)

## 2018-04-24 LAB — TYPE AND SCREEN
ABO/RH(D): O POS
Antibody Screen: NEGATIVE
Unit division: 0
Unit division: 0

## 2018-04-24 LAB — CBC
HCT: 28 % — ABNORMAL LOW (ref 39.0–52.0)
Hemoglobin: 8.5 g/dL — ABNORMAL LOW (ref 13.0–17.0)
MCH: 28.9 pg (ref 26.0–34.0)
MCHC: 30.4 g/dL (ref 30.0–36.0)
MCV: 95.2 fL (ref 80.0–100.0)
Platelets: 166 10*3/uL (ref 150–400)
RBC: 2.94 MIL/uL — ABNORMAL LOW (ref 4.22–5.81)
RDW: 18 % — AB (ref 11.5–15.5)
WBC: 6.5 10*3/uL (ref 4.0–10.5)
nRBC: 0 % (ref 0.0–0.2)

## 2018-04-24 LAB — HEPATITIS PANEL, ACUTE
HEP A IGM: NEGATIVE
Hep B C IgM: NEGATIVE
Hepatitis B Surface Ag: NEGATIVE

## 2018-04-24 LAB — URINE CULTURE
CULTURE: NO GROWTH
SPECIAL REQUESTS: NORMAL

## 2018-04-24 MED ORDER — VANCOMYCIN HCL IN DEXTROSE 1-5 GM/200ML-% IV SOLN
1000.0000 mg | INTRAVENOUS | Status: DC
  Administered 2018-04-25: 1000 mg via INTRAVENOUS
  Filled 2018-04-24: qty 200

## 2018-04-24 MED ORDER — AMOXICILLIN-POT CLAVULANATE 500-125 MG PO TABS
1.0000 | ORAL_TABLET | Freq: Two times a day (BID) | ORAL | Status: DC
  Administered 2018-04-24 – 2018-04-26 (×4): 500 mg via ORAL
  Filled 2018-04-24 (×4): qty 1

## 2018-04-24 MED ORDER — DARBEPOETIN ALFA 25 MCG/0.42ML IJ SOSY: 25.0000 ug | PREFILLED_SYRINGE | Freq: Once | INTRAMUSCULAR | Status: DC

## 2018-04-24 MED ORDER — DARBEPOETIN ALFA 40 MCG/0.4ML IJ SOSY
40.0000 ug | PREFILLED_SYRINGE | Freq: Once | INTRAMUSCULAR | Status: AC
  Administered 2018-04-24: 40 ug via SUBCUTANEOUS
  Filled 2018-04-24: qty 0.4

## 2018-04-24 MED ORDER — DARBEPOETIN ALFA 25 MCG/0.42ML IJ SOSY
25.0000 ug | PREFILLED_SYRINGE | Freq: Once | INTRAMUSCULAR | Status: DC
  Filled 2018-04-24: qty 0.42

## 2018-04-24 NOTE — Progress Notes (Addendum)
PROGRESS NOTE    [name]  EGB:[id number] DOB: 1927-01-03 DOA: 04/22/2018 PCP: Lemmie Evens, MD    Brief Narrative:  84 y.o. male, w h/o Dementia,  dm2, CKD stage 4, Gerd, Barrett's esophagus, anemia, apparently presents with altered mental status , found to be hypoglycemic,  Pt arrived in ED after glucagon with bs 78.  Pt febrile in ED.   In Ed,  T 103.4  P 121  Bp 139/57  Pox 74% ?  CXR IMPRESSION: Stable small left pleural effusion with associated basilar atelectasis   Assessment & Plan: 1-Sepsis [facility]): patient met sepsis criteria on admission; appears to be associated with PNA. -most likely aspiration in nature -broad spectrum antibiotics given initially  -now narrowing to Augmentin  -will continue supportive care -follow clinical response   2-acute toxic/metabolic encephalopathy  -due to infection/fever, worsening renal function and low CBG's -with antibiotics, IVF's and discontinuation of insulin his mentation has improved significantly  -patient is oriented X 2 and answering questions appropriately  -unsure of his baseline (had hx of dementia)  3-Acute on chronic renal failure [facility]): patient with CKD stage 4 at baseline -Cr improved with IVF's -will minimize nephrotoxic agents -follow trend   4-DM (diabetes mellitus) ([place]): with nephropathy and presented with hypoglycemia -continue just SSI for now -modified carb diet ordered  -will follow CBG's -was using diet control at home  5-HTN (hypertension) -BP stable -will monitor VS  6-acute on chronic Anemia: due to renal failure -2 units PRBC's given -Hgb up to 8.5 -no signs of overt bleeding -will follow Hgb trend -Aranesp ordered  7-Dementia ([place]) -no behavioral disturbance appreciated currently -will continue supportive care   8-GERD -continue PPI  9-BPH -continue flomax  10-positive Blood cx's to positive strep -patient neg for MRSA -abx's narrow to augmentin -will follow speciation  and sensitivity  DVT prophylaxis: SCD's Code Status: Full Code Family Communication: no family at bedside  Disposition Plan: transfer to telemetry, start narrowing antibiotics, follow clinical response.   Consultants:   None   Procedures:   See below for x-ray reports   Antimicrobials:  Anti-infectives (From admission, onward)   Start     Dose/Rate Route Frequency Ordered Stop   04/25/18 0700  vancomycin (VANCOCIN) IVPB 1000 mg/200 mL premix  Status:  Discontinued     1,000 mg 200 mL/hr over 60 Minutes Intravenous Every 48 hours 04/23/18 0737 04/24/18 1658   04/24/18 2200  amoxicillin-clavulanate (AUGMENTIN) 500-125 MG per tablet 500 mg     1 tablet Oral Every 12 hours 04/24/18 1658     04/24/18 0100  ceFEPIme (MAXIPIME) 1 g in sodium chloride 0.9 % 100 mL IVPB  Status:  Discontinued     1 g 200 mL/hr over 30 Minutes Intravenous Every 24 hours 04/23/18 0739 04/24/18 1658   04/23/18 0445  vancomycin (VANCOCIN) 1,500 mg in sodium chloride 0.9 % 500 mL IVPB     1,500 mg 250 mL/hr over 120 Minutes Intravenous  Once 04/23/18 0440 04/23/18 0707   04/23/18 0115  ceFEPIme (MAXIPIME) 2 g in sodium chloride 0.9 % 100 mL IVPB     2 g 200 mL/hr over 30 Minutes Intravenous  Once 04/23/18 0100 04/23/18 0325      Subjective: Afebrile, no CP, no nausea, no vomiting. Reports some SOB and coughing spells.   Objective: Vitals:   04/23/18 1941 04/24/18 0444 04/24/18 0900 04/24/18 1400  BP:      Pulse:      Resp:  Temp: 98.4 F (36.9 C) (!) 97.4 F (36.3 C) 98.4 F (36.9 C) (!) 97.4 F (36.3 C)  TempSrc: Oral Oral Oral Oral  SpO2:      Weight:  76.9 kg    Height:        Intake/Output Summary (Last 24 hours) at 04/24/2018 1702 Last data filed at 04/24/2018 1401 Gross per 24 hour  Intake 936 ml  Output 500 ml  Net 436 ml   Filed Weights   04/23/18 0113 04/23/18 1732 04/24/18 0444  Weight: 69.9 kg 74.6 kg 76.9 kg    Examination: General exam: Alert, awake, oriented x  2; reports feeling better, no nausea, no vomiting. Patient denies CP. Expressed mild SOB and some coughing spells.  Respiratory system: positive rhonchi, no wheezing, no crackles.  Cardiovascular system:Rate controlled, positive SEM, no rubs, no gallops. Gastrointestinal system: Abdomen is nondistended, soft and nontender. No organomegaly or masses felt. Normal bowel sounds heard. Central nervous system: Alert and oriented. No focal neurological deficits. Extremities: No Cyanosis, no clubbing. 1-2++ edema in his LE bilaterally.  Skin: No petechiae, no open wounds.   Data Reviewed: I have personally reviewed following labs and imaging studies  CBC: Recent Labs  Lab 04/22/18 2350 04/23/18 0712 04/24/18 0401  WBC 12.3* 10.8* 6.5  NEUTROABS 11.4*  --   --   HGB 6.6* 6.1* 8.5*  HCT 22.7* 21.2* 28.0*  MCV 101.3* 102.4* 95.2  PLT 199 171 474   Basic Metabolic Panel: Recent Labs  Lab 04/22/18 2350 04/23/18 0712 04/23/18 1314 04/24/18 0401  NA 139 142  --  140  K 5.4* 5.0  --  4.6  CL 117* 123*  --  122*  CO2 11* 11*  --  12*  GLUCOSE 97 103*  --  114*  BUN 74* 66*  --  59*  CREATININE 3.77* 3.44*  --  3.14*  CALCIUM 8.5* 7.9*  --  8.1*  MG  --   --  2.0  --    GFR: Estimated Creatinine Clearance: 15.4 mL/min (A) (by C-G formula based on SCr of 3.14 mg/dL (H)).   Liver Function Tests: Recent Labs  Lab 04/22/18 2350 04/23/18 0712  AST 73* 60*  ALT 35 30  ALKPHOS 93 76  BILITOT 1.1 0.8  PROT 6.8 5.6*  ALBUMIN 3.1* 2.6*   Cardiac Enzymes: Recent Labs  Lab 04/22/18 2350 04/23/18 0712 04/23/18 1314  CKTOTAL 722*  --   --   CKMB 9.5*  --   --   TROPONINI 0.34* 0.33* 0.26*   CBG: Recent Labs  Lab 04/24/18 0006 04/24/18 0401 04/24/18 0752 04/24/18 1146 04/24/18 1636  GLUCAP 103* 95 98 111* 140*   Thyroid Function Tests: Recent Labs    04/22/18 2350  TSH 0.662   Anemia Panel: Recent Labs    04/22/18 2350 04/23/18 0153  VITAMINB12 1,647*  --     FOLATE  --  52.8  FERRITIN 838*  --   TIBC 187*  --   IRON 7*  --    Urine analysis:    Component Value Date/Time   COLORURINE YELLOW 04/22/2018 2333   APPEARANCEUR CLOUDY (A) 04/22/2018 2333   LABSPEC 1.011 04/22/2018 2333   PHURINE 5.0 04/22/2018 2333   GLUCOSEU NEGATIVE 04/22/2018 2333   HGBUR MODERATE (A) 04/22/2018 2333   BILIRUBINUR NEGATIVE 04/22/2018 2333   KETONESUR 5 (A) 04/22/2018 2333   PROTEINUR NEGATIVE 04/22/2018 2333   UROBILINOGEN 0.2 11/28/2012 1626   NITRITE NEGATIVE 04/22/2018 2333  LEUKOCYTESUR NEGATIVE 04/22/2018 2333    Recent Results (from the past 240 hour(s))  Urine culture     Status: None   Collection Time: 04/22/18 11:33 PM  Result Value Ref Range Status   Specimen Description   Final    URINE, CLEAN CATCH Performed at [facility], [address] [place], [place] [address]    Special Requests   Final    Normal Performed at [facility], [address], [place], [place] [address]    Culture   Final    NO GROWTH Performed at [facility], [place] [address], [place], [place] [address]    Report Status 04/24/2018 FINAL  Final  Blood Culture (routine x 2)     Status: None (Preliminary result)   Collection Time: 04/22/18 11:51 PM  Result Value Ref Range Status   Specimen Description   Final    BLOOD LEFT FOREARM Performed at [facility], [address], [place], [place] [address]    Special Requests   Final    BOTTLES DRAWN AEROBIC AND ANAEROBIC Blood Culture adequate volume Performed at [address] [place], [place] [address]    Culture  Setup Time   Final    GRAM POSITIVE COCCI Gram Stain Report Called to,Read Back By and Verified With: SMITH,J. AT 1657 ON10/30/2019 BY EVA IN BOTH AEROBIC AND ANAEROBIC BOTTLES    Culture   Final    GRAM POSITIVE COCCI TOO YOUNG TO READ Performed at [facility], [place] [address], [place], [place] [address]    Report Status PENDING  Incomplete  Blood Culture (routine x 2)      Status: None (Preliminary result)   Collection Time: 04/22/18 11:52 PM  Result Value Ref Range Status   Specimen Description   Final    BLOOD RIGHT HAND Performed at [facility], [address], [place], [place] [address]    Special Requests   Final    BOTTLES DRAWN AEROBIC AND ANAEROBIC Blood Culture adequate volume Performed at [facility], [address], [place], [place] [address]    Culture  Setup Time   Final    GRAM POSITIVE COCCI Gram Stain Report Called to,Read Back By and Verified With: SMITH,J. AT 7948 ON 04/23/2018 BY EVA AEROBIC BOTTLE ONLY Performed at [place], READ BACK BY AND VERIFIED WITHMiquel Dunn RN 0165 04/24/18 A BROWNING    Culture   Final    GRAM POSITIVE COCCI TOO YOUNG TO READ Performed at [facility], [place] [address], [place], [place] [address]    Report Status PENDING  Incomplete  Blood Culture ID Panel (Reflexed)     Status: None   Collection Time: 04/22/18 11:52 PM  Result Value Ref Range Status   Enterococcus species NOT DETECTED NOT DETECTED Final   Listeria monocytogenes NOT DETECTED NOT DETECTED Final   Staphylococcus species NOT DETECTED NOT DETECTED Final   Staphylococcus aureus (BCID) NOT DETECTED NOT DETECTED Final   Streptococcus species NOT DETECTED NOT DETECTED Final   Streptococcus agalactiae NOT DETECTED NOT DETECTED Final   Streptococcus pneumoniae NOT DETECTED NOT DETECTED Final   Streptococcus pyogenes NOT DETECTED NOT DETECTED Final   Acinetobacter baumannii NOT DETECTED NOT DETECTED Final   Enterobacteriaceae species NOT DETECTED NOT DETECTED Final   Enterobacter cloacae complex NOT DETECTED NOT DETECTED Final   Escherichia coli NOT DETECTED NOT DETECTED Final   Klebsiella oxytoca NOT DETECTED NOT DETECTED Final  Klebsiella pneumoniae NOT DETECTED NOT DETECTED Final   Proteus species NOT DETECTED NOT DETECTED Final   Serratia marcescens NOT DETECTED NOT DETECTED Final   Haemophilus  influenzae NOT DETECTED NOT DETECTED Final   Neisseria meningitidis NOT DETECTED NOT DETECTED Final   Pseudomonas aeruginosa NOT DETECTED NOT DETECTED Final   Candida albicans NOT DETECTED NOT DETECTED Final   Candida glabrata NOT DETECTED NOT DETECTED Final   Candida krusei NOT DETECTED NOT DETECTED Final   Candida parapsilosis NOT DETECTED NOT DETECTED Final   Candida tropicalis NOT DETECTED NOT DETECTED Final    Comment: Performed at [facility], [place] [address] [place], [place] [address]  MRSA PCR Screening     Status: None   Collection Time: 04/23/18  5:50 PM  Result Value Ref Range Status   MRSA by PCR NEGATIVE NEGATIVE Final    Comment:        The GeneXpert MRSA Assay (FDA approved for NASAL specimens only), is one component of a comprehensive MRSA colonization surveillance program. It is not intended to diagnose MRSA infection nor to guide or monitor treatment for MRSA infections. Performed at [facility], [address], [place], [place] [address]      Radiology Studies: Ct Abdomen Pelvis Wo Contrast  Result Date: 04/23/2018 CLINICAL DATA:  84 year old male with abnormal liver function test. Patient was found unresponsive. EXAM: CT ABDOMEN AND PELVIS WITHOUT CONTRAST TECHNIQUE: Multidetector CT imaging of the abdomen and pelvis was performed following the standard protocol without IV contrast. COMPARISON:  CT of the abdomen pelvis dated 12/16/2012 FINDINGS: Evaluation of this exam is limited in the absence of intravenous contrast. Evaluation is also limited due to streak artifact caused by patient's arms. Lower chest: Partially visualized small right and moderate left pleural effusion with associated partial compressive atelectasis of the lower lobes. Somewhat solid-appearing 18 x 20 mm lesion in the lingula as well as an ill-defined 2.2 x 2.0 cm area in the left lung base (series 2, image 5) are not well evaluated on this noncontrast CT. These may represent areas of  consolidative changes of the lungs although lung mass/neoplasm not excluded. Chest CT with contrast may provide better evaluation on nonemergent basis. There is coronary vascular calcification. Small pericardial effusion. There is hypoattenuation of the cardiac blood pool suggestive of a degree of anemia. Clinical correlation is recommended. No intra-abdominal free air. Diffuse mesenteric stranding. No free fluid. Hepatobiliary: The liver is grossly unremarkable on this noncontrast CT. No intrahepatic biliary ductal dilatation. Small stones within the gallbladder. No pericholecystic fluid. Pancreas: Unremarkable. No pancreatic ductal dilatation or surrounding inflammatory changes. Spleen: Normal in size without focal abnormality. Adrenals/Urinary Tract: The adrenal glands are unremarkable. There is no hydronephrosis or nephrolithiasis on either side. The visualized ureters and urinary bladder appear unremarkable. Stomach/Bowel: There is moderate stool throughout the colon. There is no bowel obstruction or active inflammation. Normal appendix. Vascular/Lymphatic: Advanced aortoiliac atherosclerotic disease. No portal venous gas. There is no adenopathy. Multiple top-normal left inguinal lymph nodes, possibly reactive. Reproductive: Prostatectomy. Other: Midline vertical anterior abdominal wall incisional scar. Diffuse subcutaneous edema. Musculoskeletal: Osteopenia with degenerative changes of the spine. Old-appearing L3 compression fracture as well as old left femoral fracture status post prior internal fixation. No acute fracture. IMPRESSION: 1. No acute intra-abdominal or pelvic pathology. No bowel obstruction or active inflammation. Normal appendix. 2. Partially visualized small right and moderate left pleural effusion with associated partial compressive atelectasis of the lower lobes. 3. Ill-defined nodular densities in the lingula  and left lung base. Chest CT with contrast may provide better evaluation on  nonemergent basis. 4. Cholelithiasis. 5. Advanced Aortic Atherosclerosis (ICD10-I70.0). Electronically Signed   By: Anner Crete M.D.   On: 04/23/2018 03:18   Ct Head Wo Contrast  Result Date: 04/23/2018 CLINICAL DATA:  84 year old male with altered mental status. Patient was found unresponsive. EXAM: CT HEAD WITHOUT CONTRAST TECHNIQUE: Contiguous axial images were obtained from the base of the skull through the vertex without intravenous contrast. COMPARISON:  Head CT dated 11/28/2012 FINDINGS: Brain: There is mild to moderate age-related atrophy and chronic microvascular ischemic changes. Right frontal hypodense area likely subacute or chronic with associated encephalomalacia. Clinical correlation is recommended. There is no acute intracranial hemorrhage. No mass effect or midline shift. No extra-axial fluid collection. Vascular: No hyperdense vessel or unexpected calcification. Skull: Normal. Negative for fracture or focal lesion. Sinuses/Orbits: Mild mucoperiosteal thickening of paranasal sinuses. No air-fluid levels. The mastoid air cells are clear. Other: None IMPRESSION: 1. No acute intracranial hemorrhage. 2. Age-related atrophy and chronic microvascular ischemic changes. Right frontal subacute or old infarct. Clinical correlation is recommended. Electronically Signed   By: Anner Crete M.D.   On: 04/23/2018 03:33   Ct Chest Wo Contrast  Result Date: 04/23/2018 CLINICAL DATA:  84 year old male found unresponsive with hypoglycemia. EXAM: CT CHEST WITHOUT CONTRAST TECHNIQUE: Multidetector CT imaging of the chest was performed following the standard protocol without IV contrast. COMPARISON:  Chest radiograph dated 04/22/2018 and CT of the abdomen pelvis dated 04/23/2018. FINDINGS: Evaluation of this exam is limited in the absence of intravenous contrast. Cardiovascular: There is no cardiomegaly. No significant pericardial effusion. Multi vessel coronary vascular calcification. There is  hypoattenuation of the cardiac blood pool suggestive of a degree of anemia. Clinical correlation is recommended. There is moderate atherosclerotic calcification of the thoracic aorta. The central pulmonary arteries are grossly unremarkable on this noncontrast CT. Mediastinum/Nodes: There is no hilar or mediastinal adenopathy. Evaluation is however limited in the absence of intravenous contrast. The esophagus is grossly unremarkable. Slight heterogeneity of the thyroid gland may be related to nodules. Ultrasound may provide better evaluation on a non emergent basis. No mediastinal fluid collection. Lungs/Pleura: Moderate-sized bilateral pleural effusions with associated partial compressive atelectasis of the lower lobes. Solid-appearing nodular lesions in the lingula (series 2, image 99 measuring 2.5 x 1.8 cm) and left lower lobe (series 2, image 106) are not well evaluated but may represent consolidative changes of the lungs although solid masses are not excluded. Close follow-up or further evaluation with CT with IV contrast is recommended. There is no pneumothorax. Central airways are patent. Upper Abdomen: Partially visualized gallstones. The visualized upper abdomen is otherwise unremarkable. Musculoskeletal: Diffuse subcutaneous edema and anasarca. Osteopenia with degenerative changes of the spine. No acute osseous pathology. IMPRESSION: 1. Moderate-sized bilateral pleural effusions with associated partial compressive atelectasis of the lower lobes. 2. Consolidative or atelectatic lungs versus solid nodular lesions in the lingula and left lower lobe. Close follow-up or further evaluation with CT with IV contrast is recommended. 3. Coronary vascular calcification andAortic Atherosclerosis (ICD10-I70.0). Electronically Signed   By: Anner Crete M.D.   On: 04/23/2018 06:57   Dg Chest Port 1 View  Result Date: 04/23/2018 CLINICAL DATA:  Unresponsive 1 hour prior to admission. Lower extremity edema. EXAM:  PORTABLE CHEST 1 VIEW COMPARISON:  12/28/2017 FINDINGS: Patient is rotated to the left. Lungs are adequately inflated demonstrate a stable small left pleural effusion likely with associated basilar atelectasis. Cardiomediastinal silhouette and remainder  of the exam is unchanged. IMPRESSION: Stable small left pleural effusion with associated basilar atelectasis. Electronically Signed   By: Marin Olp M.D.   On: 04/23/2018 00:01    Scheduled Meds: . amLODipine  5 mg Oral Daily  . amoxicillin-clavulanate  1 tablet Oral Q12H  . aspirin  325 mg Oral Q breakfast  . carvedilol  3.125 mg Oral BID WC  . darbepoetin (ARANESP) injection - NON-DIALYSIS  25 mcg Subcutaneous Once  . ferrous sulfate  325 mg Oral BID WC  . folic acid  1 mg Oral Daily  . insulin aspart  0-9 Units Subcutaneous Q4H  . pantoprazole  40 mg Oral BID  . polyethylene glycol  17 g Oral Daily  . senna-docusate  1 tablet Oral BID  . tamsulosin  0.4 mg Oral Daily   Continuous Infusions: . dextrose 5 % and 0.9% NaCl 100 mL/hr at 04/24/18 1514     LOS: 1 day    Time spent: 30 minutes    Barton Dubois, MD [facility] Pager [phone]  If 7PM-7AM, please contact night-coverage [email or web address] Password [email or web address] 04/24/2018, 5:02 PM

## 2018-04-24 NOTE — Care Management (Signed)
Noted CM consult for home health needs. Patient will need a PT eval when appropriate to assess needs.

## 2018-04-24 NOTE — Care Management (Signed)
Patient active with [facility].

## 2018-04-25 ENCOUNTER — Encounter (HOSPITAL_COMMUNITY): Payer: Self-pay | Admitting: Radiology

## 2018-04-25 ENCOUNTER — Inpatient Hospital Stay (HOSPITAL_COMMUNITY): Payer: Medicare Other

## 2018-04-25 LAB — BASIC METABOLIC PANEL
Anion gap: 4 — ABNORMAL LOW (ref 5–15)
BUN: 62 mg/dL — AB (ref 8–23)
CALCIUM: 8 mg/dL — AB (ref 8.9–10.3)
CO2: 12 mmol/L — ABNORMAL LOW (ref 22–32)
Chloride: 122 mmol/L — ABNORMAL HIGH (ref 98–111)
Creatinine, Ser: 3.31 mg/dL — ABNORMAL HIGH (ref 0.61–1.24)
GFR calc Af Amer: 18 mL/min — ABNORMAL LOW (ref >60)
GFR, EST NON AFRICAN AMERICAN: 15 mL/min — AB (ref >60)
Glucose, Bld: 96 mg/dL (ref 70–99)
POTASSIUM: 4.7 mmol/L (ref 3.5–5.1)
Sodium: 138 mmol/L (ref 135–145)

## 2018-04-25 LAB — GLUCOSE, CAPILLARY
GLUCOSE-CAPILLARY: 116 mg/dL — AB (ref 70–99)
Glucose-Capillary: 132 mg/dL — ABNORMAL HIGH (ref 70–99)
Glucose-Capillary: 80 mg/dL (ref 70–99)
Glucose-Capillary: 107 mg/dL — ABNORMAL HIGH (ref 70–99)
Glucose-Capillary: 131 mg/dL — ABNORMAL HIGH (ref 70–99)
Glucose-Capillary: 86 mg/dL (ref 70–99)

## 2018-04-25 LAB — CBC
HCT: 28.8 % — ABNORMAL LOW (ref 39.0–52.0)
Hemoglobin: 8.5 g/dL — ABNORMAL LOW (ref 13.0–17.0)
MCH: 28.6 pg (ref 26.0–34.0)
MCHC: 29.5 g/dL — AB (ref 30.0–36.0)
MCV: 97 fL (ref 80.0–100.0)
PLATELETS: 170 10*3/uL (ref 150–400)
RBC: 2.97 MIL/uL — ABNORMAL LOW (ref 4.22–5.81)
RDW: 18.1 % — AB (ref 11.5–15.5)
WBC: 5.6 10*3/uL (ref 4.0–10.5)
nRBC: 0 % (ref 0.0–0.2)

## 2018-04-25 MED ORDER — IPRATROPIUM-ALBUTEROL 0.5-2.5 (3) MG/3ML IN SOLN
3.0000 mL | RESPIRATORY_TRACT | Status: DC | PRN
  Administered 2018-04-29: 3 mL via RESPIRATORY_TRACT
  Filled 2018-04-25: qty 3

## 2018-04-25 MED ORDER — PROCHLORPERAZINE EDISYLATE 10 MG/2ML IJ SOLN
10.0000 mg | Freq: Four times a day (QID) | INTRAMUSCULAR | Status: DC | PRN
  Administered 2018-04-25: 10 mg via INTRAVENOUS
  Filled 2018-04-25: qty 2

## 2018-04-25 MED ORDER — LEVALBUTEROL HCL 0.63 MG/3ML IN NEBU
INHALATION_SOLUTION | RESPIRATORY_TRACT | Status: AC
  Administered 2018-04-25: 0.63 mg
  Filled 2018-04-25: qty 3

## 2018-04-25 MED ORDER — STERILE WATER FOR INJECTION IV SOLN
INTRAVENOUS | Status: DC
  Administered 2018-04-25 – 2018-04-26 (×3): via INTRAVENOUS
  Filled 2018-04-25 (×6): qty 9.71

## 2018-04-25 MED ORDER — FUROSEMIDE 10 MG/ML IJ SOLN
60.0000 mg | Freq: Two times a day (BID) | INTRAMUSCULAR | Status: DC
  Administered 2018-04-25 – 2018-04-28 (×6): 60 mg via INTRAVENOUS
  Filled 2018-04-25 (×6): qty 6

## 2018-04-25 NOTE — NC FL2 (Signed)
MEDICAID FL2 LEVEL OF CARE SCREENING TOOL     IDENTIFICATION  Patient Name: [name] Birthdate: Nov 26, 1926 Sex: male Admission Date (Current Location): 04/22/2018  [place] and [place] Number:  [place] [id number] [place] and Address:  [place] [address], [place]      Provider Number: [id number]  Attending Physician Name and Address:  Barton Dubois, MD  Relative Name and Phone Number:  Major Placke (son) [phone]    Current Level of Care: Hospital Recommended Level of Care: [place] Prior Approval Number: [id number]  Date Approved/Denied: 01/07/18 PASRR Number:    Discharge Plan: SNF    Current Diagnoses: Patient Active Problem List   Diagnosis Date Noted  . Sepsis ([place]) 04/23/2018  . Displaced intertrochanteric fracture of left femur, initial encounter for closed fracture ([place])   . Hip fracture ([place]) 12/28/2017  . Hip fracture, unspecified laterality, closed, initial encounter ([place]) 12/28/2017  . Encephalopathy 12/28/2017  . Closed fracture of left ulnar styloid 11/28/2012  . Dizziness 11/28/2012  . Acute renal failure ([place]) 04/19/2011  . Proximal humeral fracture 04/19/2011  . Fall 04/19/2011  . DM (diabetes mellitus) ([place]) 04/19/2011  . HTN (hypertension) 04/19/2011  . Anemia 04/19/2011  . Dementia ([place]) 04/19/2011  . BPH (benign prostatic hyperplasia) 04/19/2011    Orientation RESPIRATION BLADDER Height & Weight     Self, Time, Situation  O2(see dc summary) Incontinent Weight: 179 lb 7.3 oz (81.4 kg) Height:  5\' 8"  (172.7 cm)  BEHAVIORAL SYMPTOMS/MOOD NEUROLOGICAL BOWEL NUTRITION STATUS      Continent Diet(see dc summary)  AMBULATORY STATUS COMMUNICATION OF NEEDS Skin   Extensive Assist Verbally Normal                       Personal Care Assistance Level of Assistance  Bathing, Feeding, Dressing Bathing Assistance: Limited assistance Feeding assistance:  Independent Dressing Assistance: Limited assistance     Functional Limitations Info  Sight, Hearing, Speech Sight Info: Adequate Hearing Info: Adequate Speech Info: Adequate    SPECIAL CARE FACTORS FREQUENCY  PT (By licensed PT)     PT Frequency: 5 times week              Contractures      Additional Factors Info  Code Status, Allergies Code Status Info: full Allergies Info: NKA           Current Medications (04/25/2018):  This is the current hospital active medication list Current Facility-Administered Medications  Medication Dose Route Frequency Provider Last Rate Last Dose  . amLODipine (NORVASC) tablet 5 mg  5 mg Oral Daily Jani Gravel, MD   5 mg at 04/25/18 1011  . amoxicillin-clavulanate (AUGMENTIN) 500-125 MG per tablet 500 mg  1 tablet Oral Q12H Barton Dubois, MD   500 mg at 04/25/18 1010  . aspirin EC tablet 325 mg  325 mg Oral Q breakfast Jani Gravel, MD   325 mg at 04/25/18 1010  . carvedilol (COREG) tablet 3.125 mg  3.125 mg Oral BID WC Jani Gravel, MD   3.125 mg at 04/25/18 1011  . ferrous sulfate tablet 325 mg  325 mg Oral BID WC Jani Gravel, MD   325 mg at 44/31/54 0086  . folic acid (FOLVITE) tablet 1 mg  1 mg Oral Daily Jani Gravel, MD   1 mg at 04/25/18 1011  . furosemide (LASIX) injection 60 mg  60 mg Intravenous BID Fran Lowes, MD      .  insulin aspart (novoLOG) injection 0-9 Units  0-9 Units Subcutaneous Q4H Jani Gravel, MD   1 Units at 04/24/18 2040  . ipratropium-albuterol (DUONEB) 0.5-2.5 (3) MG/3ML nebulizer solution 3 mL  3 mL Nebulization Q4H PRN Bodenheimer, Charles A, NP      . pantoprazole (PROTONIX) EC tablet 40 mg  40 mg Oral BID Jani Gravel, MD   40 mg at 04/25/18 1011  . polyethylene glycol (MIRALAX / GLYCOLAX) packet 17 g  17 g Oral Daily Jani Gravel, MD   17 g at 04/25/18 1012  . senna-docusate (Senokot-S) tablet 1 tablet  1 tablet Oral BID Jani Gravel, MD   1 tablet at 04/25/18 1010  . sodium chloride 0.225 % with sodium bicarbonate  100 mEq infusion   Intravenous Continuous Fran Lowes, MD 75 mL/hr at 04/25/18 1246    . tamsulosin (FLOMAX) capsule 0.4 mg  0.4 mg Oral Daily Jani Gravel, MD   0.4 mg at 04/25/18 1012  . vancomycin (VANCOCIN) IVPB 1000 mg/200 mL premix  1,000 mg Intravenous Q48H Barton Dubois, MD 200 mL/hr at 04/25/18 1610       Discharge Medications: Please see discharge summary for a list of discharge medications.  Relevant Imaging Results:  Relevant Lab Results:   Additional Information SSN: [id number]  Shade Flood, LCSW

## 2018-04-25 NOTE — Progress Notes (Signed)
PROGRESS NOTE    [name]  DGL:[id number] DOB: 1927/03/28 DOA: 04/22/2018 PCP: Lemmie Evens, MD    Brief Narrative:  84 y.o. male, w h/o Dementia,  dm2, CKD stage 4, Gerd, Barrett's esophagus, anemia, apparently presents with altered mental status , found to be hypoglycemic,  Pt arrived in ED after glucagon with bs 78.  Pt febrile in ED.   In Ed,  T 103.4  P 121  Bp 139/57  Pox 74% ?  CXR IMPRESSION: Stable small left pleural effusion with associated basilar atelectasis   Assessment & Plan: 1-Sepsis [facility]): patient met sepsis criteria on admission; appears to be associated with PNA. -most likely aspiration in nature -broad spectrum antibiotics given initially  -speech therapy consulted -abx's narrowed to Augmentin  -will continue supportive care -follow clinical response   2-acute toxic/metabolic encephalopathy/deconditioning  -due to infection/fever, worsening renal function and low CBG's -with antibiotics, IVF's and discontinuation of insulin his mentation has improved significantly  -patient is oriented X 2 and answering questions appropriately  -unsure of his baseline (had hx of dementia) -PT worked with him and recommended SNF  3-Acute on chronic renal failure ([place]): patient with CKD stage 4 at baseline -Cr improved some with IVF's -will continue to minimize nephrotoxic agents -follow Cr trend -will try to help volume and following renal service rec's will start lasix   4-DM (diabetes mellitus) ([place]): with nephropathy and presented with hypoglycemia -continue just SSI for now -modified carb diet ordered  -will follow CBG's -was using diet control at home only.  5-HTN (hypertension) -BP stable -will monitor VS  6-acute on chronic Anemia: due to renal failure -2 units PRBC's given -Hgb up to 8.5 and has remained stable. -no signs of overt bleeding -will follow Hgb trend -Aranesp X1 ordered  7-Dementia ([place]) -no behavioral disturbance appreciated  currently -will continue supportive care   8-GERD -continue PPI  9-BPH -continue flomax  10-positive Blood cx's: 1/2 to staph coagulase neg -most liekly contaminant -will d/c vanc -patient neg for MRSA PCR  11-metabolic acidosis -started on sodium bicarb drip by renal service  -will follow electrolytes   DVT prophylaxis: SCD's Code Status: Full Code Family Communication: no family at bedside  Disposition Plan: transfer to telemetry (to closely follow heart rate while electrolytes are abnormal, potassium), continue augmentin and d/c vanc, follow clinical response. Will follow renal service rec's.  Consultants:   Nephrology  Procedures:   See below for x-ray reports   Antimicrobials:  Anti-infectives (From admission, onward)   Start     Dose/Rate Route Frequency Ordered Stop   04/25/18 0700  vancomycin (VANCOCIN) IVPB 1000 mg/200 mL premix  Status:  Discontinued     1,000 mg 200 mL/hr over 60 Minutes Intravenous Every 48 hours 04/23/18 0737 04/24/18 1658   04/25/18 0700  vancomycin (VANCOCIN) IVPB 1000 mg/200 mL premix  Status:  Discontinued     1,000 mg 200 mL/hr over 60 Minutes Intravenous Every 48 hours 04/24/18 1749 04/25/18 1526   04/24/18 2200  amoxicillin-clavulanate (AUGMENTIN) 500-125 MG per tablet 500 mg     1 tablet Oral Every 12 hours 04/24/18 1658     04/24/18 0100  ceFEPIme (MAXIPIME) 1 g in sodium chloride 0.9 % 100 mL IVPB  Status:  Discontinued     1 g 200 mL/hr over 30 Minutes Intravenous Every 24 hours 04/23/18 0739 04/24/18 1658   04/23/18 0445  vancomycin (VANCOCIN) 1,500 mg in sodium chloride 0.9 % 500 mL IVPB     1,500  mg 250 mL/hr over 120 Minutes Intravenous  Once 04/23/18 0440 04/23/18 0707   04/23/18 0115  ceFEPIme (MAXIPIME) 2 g in sodium chloride 0.9 % 100 mL IVPB     2 g 200 mL/hr over 30 Minutes Intravenous  Once 04/23/18 0100 04/23/18 0325      Subjective: No fever, no chest pain, no nausea, no vomiting.  Patient with mild tachypnea  and mild wheezing.  Per nursing staff coughing while taking medications and food.  Objective: Vitals:   04/25/18 0420 04/25/18 0742 04/25/18 0800 04/25/18 1200  BP:   (!) 112/48 (!) 108/51  Pulse:  (!) 51 (!) 54 65  Resp:  (!) 23 (!) 25 (!) 23  Temp:  98.2 F (36.8 C)    TempSrc:  Oral    SpO2:  99% 98% 97%  Weight: 81.4 kg     Height:        Intake/Output Summary (Last 24 hours) at 04/25/2018 1528 Last data filed at 04/25/2018 1519 Gross per 24 hour  Intake 3685.87 ml  Output 1225 ml  Net 2460.87 ml   Filed Weights   04/23/18 1732 04/24/18 0444 04/25/18 0420  Weight: 74.6 kg 76.9 kg 81.4 kg    Examination: General exam: Alert, awake, oriented x 2; afebrile, reports feeling better. By nursing staff patient found coughing while taking medications and food.  Denies chest pain. Respiratory system: mild wheezing, fair air movement bilaterally, decreased breath sounds at the bases.  No frank crackles appreciated. Cardiovascular system:Rate controlled, positive SEM appreciated, no rubs, no gallops. Gastrointestinal system: Abdomen is nondistended, soft and nontender. No organomegaly or masses felt. Normal bowel sounds heard. Central nervous system: Alert and oriented.  Able to follow simple commands.  Patient is weak and deconditioned. Extremities: 1-2+ edema bilaterally, no cyanosis. Skin: No rashes, no petechiae, no open wounds.  Lesions or ulcers  Data Reviewed: I have personally reviewed following labs and imaging studies  CBC: Recent Labs  Lab 04/22/18 2350 04/23/18 0712 04/24/18 0401 04/25/18 0410  WBC 12.3* 10.8* 6.5 5.6  NEUTROABS 11.4*  --   --   --   HGB 6.6* 6.1* 8.5* 8.5*  HCT 22.7* 21.2* 28.0* 28.8*  MCV 101.3* 102.4* 95.2 97.0  PLT 199 171 166 517   Basic Metabolic Panel: Recent Labs  Lab 04/22/18 2350 04/23/18 0712 04/23/18 1314 04/24/18 0401 04/25/18 0410  NA 139 142  --  140 138  K 5.4* 5.0  --  4.6 4.7  CL 117* 123*  --  122* 122*  CO2 11* 11*   --  12* 12*  GLUCOSE 97 103*  --  114* 96  BUN 74* 66*  --  59* 62*  CREATININE 3.77* 3.44*  --  3.14* 3.31*  CALCIUM 8.5* 7.9*  --  8.1* 8.0*  MG  --   --  2.0  --   --    GFR: Estimated Creatinine Clearance: 14.6 mL/min (A) (by C-G formula based on SCr of 3.31 mg/dL (H)).   Liver Function Tests: Recent Labs  Lab 04/22/18 2350 04/23/18 0712  AST 73* 60*  ALT 35 30  ALKPHOS 93 76  BILITOT 1.1 0.8  PROT 6.8 5.6*  ALBUMIN 3.1* 2.6*   Cardiac Enzymes: Recent Labs  Lab 04/22/18 2350 04/23/18 0712 04/23/18 1314  CKTOTAL 722*  --   --   CKMB 9.5*  --   --   TROPONINI 0.34* 0.33* 0.26*   CBG: Recent Labs  Lab 04/24/18 1954 04/24/18 2354 04/25/18 0017  04/25/18 0741 04/25/18 1156  GLUCAP 132* 89 80 107* 116*   Thyroid Function Tests: Recent Labs    04/22/18 2350  TSH 0.662   Anemia Panel: Recent Labs    04/22/18 2350 04/23/18 0153  VITAMINB12 1,647*  --   FOLATE  --  52.8  FERRITIN 838*  --   TIBC 187*  --   IRON 7*  --    Urine analysis:    Component Value Date/Time   COLORURINE YELLOW 04/22/2018 2333   APPEARANCEUR CLOUDY (A) 04/22/2018 2333   LABSPEC 1.011 04/22/2018 2333   PHURINE 5.0 04/22/2018 2333   GLUCOSEU NEGATIVE 04/22/2018 2333   HGBUR MODERATE (A) 04/22/2018 2333   BILIRUBINUR NEGATIVE 04/22/2018 2333   KETONESUR 5 (A) 04/22/2018 2333   PROTEINUR NEGATIVE 04/22/2018 2333   UROBILINOGEN 0.2 11/28/2012 1626   NITRITE NEGATIVE 04/22/2018 2333   LEUKOCYTESUR NEGATIVE 04/22/2018 2333    Recent Results (from the past 240 hour(s))  Urine culture     Status: None   Collection Time: 04/22/18 11:33 PM  Result Value Ref Range Status   Specimen Description   Final    URINE, CLEAN CATCH Performed at [facility], [address], [place], [place] [address]    Special Requests   Final    Normal Performed at [facility], [address], [place], [place] [address]    Culture   Final    NO GROWTH Performed at [facility], [place]  [address], [place], [place] [address]    Report Status 04/24/2018 FINAL  Final  Blood Culture (routine x 2)     Status: Abnormal (Preliminary result)   Collection Time: 04/22/18 11:51 PM  Result Value Ref Range Status   Specimen Description   Final    BLOOD LEFT FOREARM Performed at [facility], [address], [place], [place] [address]    Special Requests   Final    BOTTLES DRAWN AEROBIC AND ANAEROBIC Blood Culture adequate volume Performed at [place] [place], [place] [address]    Culture  Setup Time   Final    GRAM POSITIVE COCCI Gram Stain Report Called to,Read Back By and Verified With: SMITH,J. AT 1657 ON10/30/2019 BY EVA IN BOTH AEROBIC AND ANAEROBIC BOTTLES    Culture (A)  Final    STAPHYLOCOCCUS SPECIES (COAGULASE NEGATIVE) SUSCEPTIBILITIES TO FOLLOW Performed at [facility], [place] [address] [place], [place] [address]    Report Status PENDING  Incomplete  Blood Culture (routine x 2)     Status: Abnormal (Preliminary result)   Collection Time: 04/22/18 11:52 PM  Result Value Ref Range Status   Specimen Description   Final    BLOOD RIGHT HAND Performed at [facility], [address], [place], [place] [address]    Special Requests   Final    BOTTLES DRAWN AEROBIC AND ANAEROBIC Blood Culture adequate volume Performed at [facility], [address] [place], [place] [address]    Culture  Setup Time   Final    GRAM POSITIVE COCCI Gram Stain Report Called to,Read Back By and Verified With: SMITH,J. AT 2841 ON 04/23/2018 BY EVA AEROBIC BOTTLE ONLY Performed at [place], READ BACK BY AND VERIFIED WITHMiquel Dunn RN 3244 04/24/18 A BROWNING    Culture (A)  Final    STAPHYLOCOCCUS SPECIES (COAGULASE NEGATIVE) SUSCEPTIBILITIES TO FOLLOW Performed at [facility], [place] [address], [place],  [address]    Report  Status PENDING  Incomplete  Blood Culture ID Panel (Reflexed)     Status: None   Collection Time:  04/22/18 11:52 PM  Result Value Ref Range Status   Enterococcus species NOT DETECTED NOT DETECTED Final   Listeria monocytogenes NOT DETECTED NOT DETECTED Final   Staphylococcus species NOT DETECTED NOT DETECTED Final   Staphylococcus aureus (BCID) NOT DETECTED NOT DETECTED Final   Streptococcus species NOT DETECTED NOT DETECTED Final   Streptococcus agalactiae NOT DETECTED NOT DETECTED Final   Streptococcus pneumoniae NOT DETECTED NOT DETECTED Final   Streptococcus pyogenes NOT DETECTED NOT DETECTED Final   Acinetobacter baumannii NOT DETECTED NOT DETECTED Final   Enterobacteriaceae species NOT DETECTED NOT DETECTED Final   Enterobacter cloacae complex NOT DETECTED NOT DETECTED Final   Escherichia coli NOT DETECTED NOT DETECTED Final   Klebsiella oxytoca NOT DETECTED NOT DETECTED Final   Klebsiella pneumoniae NOT DETECTED NOT DETECTED Final   Proteus species NOT DETECTED NOT DETECTED Final   Serratia marcescens NOT DETECTED NOT DETECTED Final   Haemophilus influenzae NOT DETECTED NOT DETECTED Final   Neisseria meningitidis NOT DETECTED NOT DETECTED Final   Pseudomonas aeruginosa NOT DETECTED NOT DETECTED Final   Candida albicans NOT DETECTED NOT DETECTED Final   Candida glabrata NOT DETECTED NOT DETECTED Final   Candida krusei NOT DETECTED NOT DETECTED Final   Candida parapsilosis NOT DETECTED NOT DETECTED Final   Candida tropicalis NOT DETECTED NOT DETECTED Final    Comment: Performed at [facility], [place]. [address] [place], [place] [address]  MRSA PCR Screening     Status: None   Collection Time: 04/23/18  5:50 PM  Result Value Ref Range Status   MRSA by PCR NEGATIVE NEGATIVE Final    Comment:        The GeneXpert MRSA Assay (FDA approved for NASAL specimens only), is one component of a comprehensive MRSA colonization surveillance program. It is not intended to diagnose MRSA infection nor to guide or monitor treatment for MRSA infections. Performed at [facility], [address], [place], [place] [address]      Radiology Studies: No results found.  Scheduled Meds: . amLODipine  5 mg Oral Daily  . amoxicillin-clavulanate  1 tablet Oral Q12H  . aspirin  325 mg Oral Q breakfast  . carvedilol  3.125 mg Oral BID WC  . ferrous sulfate  325 mg Oral BID WC  . folic acid  1 mg Oral Daily  . furosemide  60 mg Intravenous BID  . insulin aspart  0-9 Units Subcutaneous Q4H  . pantoprazole  40 mg Oral BID  . polyethylene glycol  17 g Oral Daily  . senna-docusate  1 tablet Oral BID  . tamsulosin  0.4 mg Oral Daily   Continuous Infusions: .  sodium bicarbonate infusion 1/4 NS 1000 mL 75 mL/hr at 04/25/18 1246     LOS: 2 days    Time spent: 30 minutes    Barton Dubois, MD [facility] Pager [phone]  If 7PM-7AM, please contact night-coverage [email or web address] Password [email or web address] 04/25/2018, 3:28 PM
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
## 2018-04-25 NOTE — Clinical Social Work Note (Signed)
CSW notified by PT that pt would need SNF rehab. Pt unable to participate in CSW assessment. Attempted to reach pt's son by phone. Had to leave a HIPPA compliant voicemail message requesting return call to discuss dc planning. Have not yet received return call. Will follow and assist with dc needs.

## 2018-04-25 NOTE — Evaluation (Signed)
Physical Therapy Evaluation Patient Details Name: [name] MRN: [id number] DOB: [birth year] Today's Date: 04/25/2018   History of Present Illness  [name]  is a 84 y.o. male, w h/o Dementia,  dm2, CKD stage 4, Gerd, Barrett's esophagus, anemia, apparently presents with altered mental status , found to be hypoglycemic,  Pt arrived in ED after glucagon with bs 78.  Pt febrile in ED.     Clinical Impression  Patient required much assistance to sit up at bedside due to c/o low back pain and BUE pain with any pressure to arms due to fragile skin.  Patient limited to a few side steps at bedside due to BLE weakness, poor standing resulting in severe fall risk.  Patient requested to go back to bed after therapy due to fatigue.  Patient will benefit from continued physical therapy in hospital and recommended venue below to increase strength, balance, endurance for safe ADLs and gait.    Follow Up Recommendations SNF;Supervision/Assistance - 24 hour;Supervision for mobility/OOB    Equipment Recommendations  None recommended by PT    Recommendations for Other Services       Precautions / Restrictions Precautions Precautions: Fall Restrictions Weight Bearing Restrictions: No      Mobility  Bed Mobility Overal bed mobility: Needs Assistance Bed Mobility: Supine to Sit;Sit to Supine;Rolling Rolling: Max assist   Supine to sit: Max assist Sit to supine: Max assist   General bed mobility comments: slow labored movement with c/o severe back pain during supine to sitting  Transfers Overall transfer level: Needs assistance Equipment used: Rolling walker (2 wheeled) Transfers: Sit to/from Stand Sit to Stand: Max assist         General transfer comment: very kyphotic trunk with leaning over walker  Ambulation/Gait Ambulation/Gait assistance: Max assist Gait Distance (Feet): 2 Feet Assistive device: Rolling walker (2 wheeled) Gait Pattern/deviations: Decreased step length -  right;Decreased step length - left;Decreased stride length Gait velocity: slow   General Gait Details: limited to 3-4 slow unsteady labored side steps, unable to take steps away from bedside due to fall risk, BLE weakness, and fatigue  Stairs            Wheelchair Mobility    Modified Rankin (Stroke Patients Only)       Balance Overall balance assessment: Needs assistance Sitting-balance support: Feet supported;No upper extremity supported Sitting balance-Leahy Scale: Fair     Standing balance support: Bilateral upper extremity supported;During functional activity Standing balance-Leahy Scale: Poor Standing balance comment: using RW                             Pertinent Vitals/Pain Pain Assessment: Faces Faces Pain Scale: Hurts whole lot Pain Location: low back and BUE mostly due to fragile skin  Pain Descriptors / Indicators: Grimacing;Discomfort;Moaning;Sharp Pain Intervention(s): Limited activity within patient's tolerance;Monitored during session    Home Living Family/patient expects to be discharged to:: Private residence Living Arrangements: Children Available Help at Discharge: [place]: Gilford Rile - 2 wheels;Wheelchair - manual Additional Comments: Patient is a poor historian    Prior Function Level of Independence: Needs assistance   Gait / Transfers Assistance Needed: per patient he was household ambulator with RW  ADL's / Homemaking Assistance Needed: assisted by family  Comments: patient unable to provide PLOF details     Hand Dominance        Extremity/Trunk Assessment  Upper Extremity Assessment Upper Extremity Assessment: Generalized weakness    Lower Extremity Assessment Lower Extremity Assessment: Generalized weakness    Cervical / Trunk Assessment Cervical / Trunk Assessment: Kyphotic  Communication   Communication: HOH  Cognition Arousal/Alertness: Awake/alert Behavior During Therapy:  Anxious Overall Cognitive Status: History of cognitive impairments - at baseline                                        General Comments      Exercises     Assessment/Plan    PT Assessment Patient needs continued PT services  PT Problem List Decreased strength;Decreased activity tolerance;Decreased balance;Decreased mobility       PT Treatment Interventions Gait training;Stair training;Functional mobility training;Therapeutic activities;Therapeutic exercise;Patient/family education    PT Goals (Current goals can be found in the Care Plan section)  Acute Rehab PT Goals Patient Stated Goal: return home Time For Goal Achievement: 05/09/18 Potential to Achieve Goals: Fair    Frequency Min 3X/week   Barriers to discharge        Co-evaluation               AM-PAC PT "6 Clicks" Daily Activity  Outcome Measure Difficulty turning over in bed (including adjusting bedclothes, sheets and blankets)?: Unable Difficulty moving from lying on back to sitting on the side of the bed? : Unable Difficulty sitting down on and standing up from a chair with arms (e.g., wheelchair, bedside commode, etc,.)?: Unable Help needed moving to and from a bed to chair (including a wheelchair)?: A Lot Help needed walking in hospital room?: A Lot Help needed climbing 3-5 steps with a railing? : Total 6 Click Score: 8    End of Session Equipment Utilized During Treatment: Gait belt Activity Tolerance: Patient limited by fatigue;Patient limited by pain Patient left: in bed;with call bell/phone within reach;with bed alarm set Nurse Communication: Mobility status PT Visit Diagnosis: Unsteadiness on feet (R26.81);Other abnormalities of gait and mobility (R26.89);Muscle weakness (generalized) (M62.81)    Time: 0881-1031 PT Time Calculation (min) (ACUTE ONLY): 26 min   Charges:   PT Evaluation $PT Eval Moderate Complexity: 1 Mod PT Treatments $Therapeutic Activity: 23-37 mins         1:55 PM, 04/25/18 Lonell Grandchild, MPT Physical Therapist with [facility] [phone] office [phone] mobile phone

## 2018-04-25 NOTE — Evaluation (Addendum)
Modified Barium Swallow Progress Note  Patient Details  Name: [name] MRN: [id number] Date of Birth: April 11, 1927  Today's Date: 04/25/2018  Modified Barium Swallow completed.  Full report located under Chart Review in the Imaging Section.  Brief recommendations include the following:  Clinical Impression  Pt presents with mild cognitive based oropharyngeal dysphagia and suspected esophageal dysphagia with risk of post prandial aspiration. Oral phase was characterized by oral holding and premature spillage of thin liquids to the pyriforms with a significant delay in swallow initiation with bolus often filling pharyngeal space for a few seconds before swallow trigger. Despite significant delay, note adequate hyolaryngeal excursion (once swallow is triggered) and good laryngeal vestibule closure with protection of the airway.  Pt demonstrated one episode of trace penetration with pill administration that was unfortunately not visualized but likely dropped to the cords and was potentially expelled by reflexive cough. Puree' and solid textures with decreased bolus cohesion and min delay to the level of the valleculae and mild oral residue after the swallow. Esophageal sweep reveals standing column of barium in the esophagus with to and fro movement that did not pass through the GE junction-- retrograde movement through the UES was not observed; no radiologist present to confirm. Suspect thoracic kyphosis further impacting gravity assist on esophagus. Recommend D2/fine chop and thin liquids with meds to be administered whole in puree. Recommend reflux and standard aspiration precautions. ST will continue to follow acutely.   Swallow Evaluation Recommendations   Recommended Consults: Consider GI evaluation   SLP Diet Recommendations: Dysphagia 2 (Fine chop) solids;Thin liquid   Liquid Administration via: Cup;Straw   Medication Administration: Whole meds with puree   Supervision: Full assist for  feeding;Full supervision/cueing for compensatory strategies   Compensations: Minimize environmental distractions;Slow rate;Small sips/bites;Follow solids with liquid;Clear throat intermittently   Postural Changes: Seated upright at 90 degrees;Remain semi-upright after after feeds/meals (Comment)   Oral Care Recommendations: Oral care BID      Josedejesus Marcum H. Roddie Mc, [place] Speech Language Pathologist   Wende Bushy 04/25/2018,5:39 PM
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
## 2018-04-25 NOTE — Care Management Important Message (Signed)
Important Message  Patient Details  Name: [name] MRN: [id number] Date of Birth: 21-Dec-1926   Medicare Important Message Given:  Yes    [name] 04/25/2018, 2:46 PM

## 2018-04-25 NOTE — Progress Notes (Signed)
Patient is beginning to have Upper air way wheezes. Patient is about 6000 cc ahead on fluid. Saturation 96 on room air.  CO2 on chem blood is 12. GFR is 16

## 2018-04-25 NOTE — Plan of Care (Signed)
  Problem: Acute Rehab PT Goals(only PT should resolve) Goal: Pt Will Go Supine/Side To Sit Outcome: Progressing Flowsheets (Taken 04/25/2018 1356) Pt will go Supine/Side to Sit: with minimal assist; with moderate assist Goal: Patient Will Transfer Sit To/From Stand Outcome: Progressing Flowsheets (Taken 04/25/2018 1356) Patient will transfer sit to/from stand: with minimal assist; with moderate assist Goal: Pt Will Transfer Bed To Chair/Chair To Bed Outcome: Progressing Flowsheets (Taken 04/25/2018 1356) Pt will Transfer Bed to Chair/Chair to Bed: with min assist; with mod assist Goal: Pt Will Ambulate Outcome: Progressing Flowsheets (Taken 04/25/2018 1356) Pt will Ambulate: 15 feet; with moderate assist; with rolling walker   1:57 PM, 04/25/18 Lonell Grandchild, MPT Physical Therapist with [facility] [phone] office [phone] mobile phone

## 2018-04-26 DIAGNOSIS — N189 Chronic kidney disease, unspecified: Secondary | ICD-10-CM

## 2018-04-26 DIAGNOSIS — R7881 Bacteremia: Secondary | ICD-10-CM

## 2018-04-26 LAB — RENAL FUNCTION PANEL
Albumin: 2.1 g/dL — ABNORMAL LOW (ref 3.5–5.0)
Anion gap: 4 — ABNORMAL LOW (ref 5–15)
BUN: 63 mg/dL — ABNORMAL HIGH (ref 8–23)
CALCIUM: 8 mg/dL — AB (ref 8.9–10.3)
CO2: 16 mmol/L — AB (ref 22–32)
CREATININE: 3.25 mg/dL — AB (ref 0.61–1.24)
Chloride: 121 mmol/L — ABNORMAL HIGH (ref 98–111)
GFR, EST AFRICAN AMERICAN: 18 mL/min — AB (ref >60)
GFR, EST NON AFRICAN AMERICAN: 16 mL/min — AB (ref >60)
Glucose, Bld: 88 mg/dL (ref 70–99)
Phosphorus: 3.8 mg/dL (ref 2.5–4.6)
Potassium: 4.4 mmol/L (ref 3.5–5.1)
Sodium: 141 mmol/L (ref 135–145)

## 2018-04-26 LAB — CULTURE, BLOOD (ROUTINE X 2): Special Requests: ADEQUATE

## 2018-04-26 LAB — GLUCOSE, CAPILLARY
GLUCOSE-CAPILLARY: 79 mg/dL (ref 70–99)
GLUCOSE-CAPILLARY: 107 mg/dL — AB (ref 70–99)
GLUCOSE-CAPILLARY: 124 mg/dL — AB (ref 70–99)
Glucose-Capillary: 83 mg/dL (ref 70–99)
Glucose-Capillary: 97 mg/dL (ref 70–99)
Glucose-Capillary: 98 mg/dL (ref 70–99)

## 2018-04-26 LAB — MAGNESIUM: Magnesium: 1.6 mg/dL — ABNORMAL LOW (ref 1.7–2.4)

## 2018-04-26 MED ORDER — CARVEDILOL 3.125 MG PO TABS
3.1250 mg | ORAL_TABLET | ORAL | Status: AC
  Administered 2018-04-26: 3.125 mg via ORAL
  Filled 2018-04-26: qty 1

## 2018-04-26 MED ORDER — CARVEDILOL 3.125 MG PO TABS
6.2500 mg | ORAL_TABLET | Freq: Two times a day (BID) | ORAL | Status: DC
  Administered 2018-04-27 – 2018-05-04 (×15): 6.25 mg via ORAL
  Filled 2018-04-26 (×15): qty 2

## 2018-04-26 MED ORDER — SODIUM CHLORIDE 0.9 % IV SOLN
2.0000 g | INTRAVENOUS | Status: DC
  Administered 2018-04-26 – 2018-04-28 (×3): 2 g via INTRAVENOUS
  Filled 2018-04-26: qty 2
  Filled 2018-04-26: qty 20
  Filled 2018-04-26: qty 2
  Filled 2018-04-26 (×2): qty 20
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
## 2018-04-27 LAB — CBC
HCT: 31 % — ABNORMAL LOW (ref 39.0–52.0)
Hemoglobin: 9.2 g/dL — ABNORMAL LOW (ref 13.0–17.0)
MCH: 28.8 pg (ref 26.0–34.0)
MCHC: 29.7 g/dL — AB (ref 30.0–36.0)
MCV: 96.9 fL (ref 80.0–100.0)
PLATELETS: 170 10*3/uL (ref 150–400)
RBC: 3.2 MIL/uL — ABNORMAL LOW (ref 4.22–5.81)
RDW: 17.2 % — AB (ref 11.5–15.5)
WBC: 8.5 10*3/uL (ref 4.0–10.5)
nRBC: 0 % (ref 0.0–0.2)

## 2018-04-27 LAB — RENAL FUNCTION PANEL
ALBUMIN: 2.2 g/dL — AB (ref 3.5–5.0)
ANION GAP: 7 (ref 5–15)
BUN: 67 mg/dL — ABNORMAL HIGH (ref 8–23)
CALCIUM: 8 mg/dL — AB (ref 8.9–10.3)
CO2: 15 mmol/L — AB (ref 22–32)
Chloride: 117 mmol/L — ABNORMAL HIGH (ref 98–111)
Creatinine, Ser: 3.2 mg/dL — ABNORMAL HIGH (ref 0.61–1.24)
GFR calc non Af Amer: 16 mL/min — ABNORMAL LOW (ref >60)
GFR, EST AFRICAN AMERICAN: 18 mL/min — AB (ref >60)
Glucose, Bld: 105 mg/dL — ABNORMAL HIGH (ref 70–99)
PHOSPHORUS: 4.3 mg/dL (ref 2.5–4.6)
Potassium: 4.7 mmol/L (ref 3.5–5.1)
SODIUM: 139 mmol/L (ref 135–145)

## 2018-04-27 LAB — GLUCOSE, CAPILLARY
GLUCOSE-CAPILLARY: 98 mg/dL (ref 70–99)
GLUCOSE-CAPILLARY: 80 mg/dL (ref 70–99)
Glucose-Capillary: 146 mg/dL — ABNORMAL HIGH (ref 70–99)
Glucose-Capillary: 148 mg/dL — ABNORMAL HIGH (ref 70–99)
Glucose-Capillary: 153 mg/dL — ABNORMAL HIGH (ref 70–99)
Glucose-Capillary: 95 mg/dL (ref 70–99)

## 2018-04-27 LAB — CULTURE, BLOOD (ROUTINE X 2): SPECIAL REQUESTS: ADEQUATE

## 2018-04-27 MED ORDER — SODIUM BICARBONATE 650 MG PO TABS
650.0000 mg | ORAL_TABLET | Freq: Two times a day (BID) | ORAL | Status: DC
  Administered 2018-04-27 – 2018-04-28 (×4): 650 mg via ORAL
  Filled 2018-04-27 (×5): qty 1

## 2018-04-27 MED ORDER — METOLAZONE 5 MG PO TABS
5.0000 mg | ORAL_TABLET | Freq: Every day | ORAL | Status: DC
  Administered 2018-04-27 – 2018-05-01 (×5): 5 mg via ORAL
  Filled 2018-04-27 (×6): qty 1
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
## 2018-04-28 LAB — CREATININE, SERUM
CREATININE: 3.28 mg/dL — AB (ref 0.61–1.24)
GFR calc Af Amer: 18 mL/min — ABNORMAL LOW (ref >60)
GFR calc non Af Amer: 15 mL/min — ABNORMAL LOW (ref >60)

## 2018-04-28 LAB — MULTIPLE MYELOMA PANEL, SERUM
ALBUMIN SERPL ELPH-MCNC: 2.6 g/dL — AB (ref 2.9–4.4)
ALBUMIN/GLOB SERPL: 1 (ref 0.7–1.7)
ALPHA 1: 0.3 g/dL (ref 0.0–0.4)
ALPHA2 GLOB SERPL ELPH-MCNC: 0.4 g/dL (ref 0.4–1.0)
B-GLOBULIN SERPL ELPH-MCNC: 0.8 g/dL (ref 0.7–1.3)
GAMMA GLOB SERPL ELPH-MCNC: 1.2 g/dL (ref 0.4–1.8)
GLOBULIN, TOTAL: 2.8 g/dL (ref 2.2–3.9)
IGA: 289 mg/dL (ref 61–437)
IGG (IMMUNOGLOBIN G), SERUM: 1265 mg/dL (ref 700–1600)
IgM (Immunoglobulin M), Srm: 40 mg/dL (ref 15–143)
Total Protein ELP: 5.4 g/dL — ABNORMAL LOW (ref 6.0–8.5)

## 2018-04-28 LAB — RENAL FUNCTION PANEL
ANION GAP: 7 (ref 5–15)
Albumin: 2.2 g/dL — ABNORMAL LOW (ref 3.5–5.0)
BUN: 67 mg/dL — AB (ref 8–23)
CALCIUM: 8 mg/dL — AB (ref 8.9–10.3)
CO2: 16 mmol/L — ABNORMAL LOW (ref 22–32)
CREATININE: 3.38 mg/dL — AB (ref 0.61–1.24)
Chloride: 115 mmol/L — ABNORMAL HIGH (ref 98–111)
GFR calc Af Amer: 17 mL/min — ABNORMAL LOW (ref >60)
GFR calc non Af Amer: 15 mL/min — ABNORMAL LOW (ref >60)
Glucose, Bld: 120 mg/dL — ABNORMAL HIGH (ref 70–99)
POTASSIUM: 4.8 mmol/L (ref 3.5–5.1)
Phosphorus: 5 mg/dL — ABNORMAL HIGH (ref 2.5–4.6)
Sodium: 138 mmol/L (ref 135–145)

## 2018-04-28 LAB — GLUCOSE, CAPILLARY
GLUCOSE-CAPILLARY: 109 mg/dL — AB (ref 70–99)
GLUCOSE-CAPILLARY: 118 mg/dL — AB (ref 70–99)
GLUCOSE-CAPILLARY: 155 mg/dL — AB (ref 70–99)
Glucose-Capillary: 110 mg/dL — ABNORMAL HIGH (ref 70–99)
Glucose-Capillary: 109 mg/dL — ABNORMAL HIGH (ref 70–99)
Glucose-Capillary: 126 mg/dL — ABNORMAL HIGH (ref 70–99)
Glucose-Capillary: 100 mg/dL — ABNORMAL HIGH (ref 70–99)

## 2018-04-28 MED ORDER — CEPHALEXIN 250 MG PO CAPS
250.0000 mg | ORAL_CAPSULE | Freq: Three times a day (TID) | ORAL | Status: DC
  Administered 2018-04-28 – 2018-05-04 (×17): 250 mg via ORAL
  Filled 2018-04-28 (×17): qty 1

## 2018-04-28 MED ORDER — TORSEMIDE 20 MG PO TABS
60.0000 mg | ORAL_TABLET | Freq: Every day | ORAL | Status: DC
  Administered 2018-04-28 – 2018-05-01 (×4): 60 mg via ORAL
  Filled 2018-04-28 (×5): qty 3

## 2018-04-28 NOTE — Progress Notes (Signed)
Subjective: Interval History: none.  Patient with difficulty in hearing.  Offers no complaints.  Objective: Vital signs in last 24 hours: Temp:  [97.4 F (36.3 C)-98.7 F (37.1 C)] 98.7 F (37.1 C) (11/04 0556) Pulse Rate:  [71-78] 71 (11/04 0840) Resp:  [19-22] 20 (11/04 0556) BP: (125-164)/(56-64) 164/64 (11/04 0840) SpO2:  [95 %-98 %] 95 % (11/04 0556) Weight:  [82.7 kg] 82.7 kg (11/04 0500) Weight change: 0.2 kg  Intake/Output from previous day: 11/03 0701 - 11/04 0700 In: 600 [P.O.:600] Out: 1600 [Urine:1600] Intake/Output this shift: Total I/O In: -  Out: 1300 [Urine:1300]  Generally patient is alert, eating his breakfast.  He does not seem to be in any apparent distress. Chest: He has some inspiratory crackles Heart exam revealed regular rate and rhythm Extremities he has possibly 1+ edema  Lab Results: Recent Labs    04/27/18 0637  WBC 8.5  HGB 9.2*  HCT 31.0*  PLT 170   BMET:  Recent Labs    04/27/18 0637 04/28/18 0539  NA 139 138  K 4.7 4.8  CL 117* 115*  CO2 15* 16*  GLUCOSE 105* 120*  BUN 67* 67*  CREATININE 3.20* 3.38*  3.28*  CALCIUM 8.0* 8.0*   No results for input(s): PTH in the last 72 hours. Iron Studies: No results for input(s): IRON, TIBC, TRANSFERRIN, FERRITIN in the last 72 hours.  Studies/Results: No results found.  I have reviewed the patient's current medications.  Assessment/Plan: 1] possibly acute kidney injury superimposed on chronic.  His renal function remains a stable.  Patient is a symptomatic. 2] fluid management: Patient on Lasix iv and metolazone orally..  He has 1600 cc of urine output.  Seems to be better. 3] low CO2: Possibly metabolic acidosis.  Patient is started on sodium bicarbonate he CO2 is 16 and improving. 4] bone and mineral disorder: His calcium and phosphorus is range 5] history of chronic renal failure: Late stage IV or early stage V.  Etiology was thought to be secondary to diabetes/hypertension 6]  hypertension: His blood pressure is reasonably controlled 7] history of diabetes : His blood sugar is reasonably controlled 8] anemia: Anemia secondary to chronic renal failure.  Patient is on Aranesp as an outpatient.  His hemoglobin is stable. Plan: 1] we will continue with metolazone 2] we will check his renal panel and CBC in the morning. 3] we will DC Lasix 4] we will start on Demadex 60 mg p.o. once a day   LOS: 5 days   Newt Levingston S 04/28/2018,9:13 AM

## 2018-04-28 NOTE — Clinical Social Work Note (Signed)
LCSW following. Spoke with pt's contact, Tammy, today to follow up on dc planning. Per Tammy, pt was just discharged from [facility] on 04/07/18. She thinks he may have used all of his Medicare benefits. Tammy states pt will have to go to SNF under his Medicaid. She wants him to stay in [place]. Will make requested referrals and continue to follow. MD stated pt is not yet medically stable for dc.

## 2018-04-28 NOTE — Progress Notes (Signed)
  Speech Language Pathology Treatment: Dysphagia  Patient Details Name: [name] MRN: [id number] DOB: [birth year] Today's Date: 04/28/2018 Time: 8676-1950 SLP Time Calculation (min) (ACUTE ONLY): 12 min  Assessment / Plan / Recommendation Clinical Impression  Pt seen for ongoing diagnostic dysphagia intervention as a follow up from MBSS completed on Friday. Nursing staff reports that Pt consumed 100% of lunch tray and all liquids when provided with feeding assist. Pt agreeable to water and coffee with SLP this date. Pt consumed via straw sips without overt signs or symptoms of aspiration when seated upright. Pt able to generate a cued cough upon SLP request. Current diet of D2/fine chop and thin liquids continues to remain appropriate for Pt. SLP will sign off during acute stay.    HPI HPI: [name]  is a 84 y.o. male, w h/o Dementia,  dm2, CKD stage 4, Gerd, Barrett's esophagus, anemia, apparently presents with altered mental status , found to be hypoglycemic,  Pt arrived in ED after glucagon with bs 78.  Pt febrile in ED and was admitted for sepsis. Pt's CXR supports MD concern for aspiration. BSE determined need for instrumental testing; MBS to objectively assess the swallowing function.      SLP Plan  All goals met;Discharge SLP treatment due to (comment)       Recommendations  Diet recommendations: Dysphagia 2 (fine chop);Thin liquid Liquids provided via: Cup;Straw Medication Administration: Whole meds with puree Supervision: Staff to assist with self feeding;Full supervision/cueing for compensatory strategies Compensations: Minimize environmental distractions;Slow rate;Small sips/bites;Follow solids with liquid;Clear throat intermittently Postural Changes and/or Swallow Maneuvers: Seated upright 90 degrees;Upright 30-60 min after meal                Oral Care Recommendations: Oral care BID Follow up Recommendations: 24 hour supervision/assistance;Skilled Nursing  facility SLP Visit Diagnosis: Dysphagia, unspecified (R13.10) Plan: All goals met;Discharge SLP treatment due to (comment)       Thank you,  Genene Churn, [place]                [place] 04/28/2018, 3:38 PM

## 2018-04-28 NOTE — Progress Notes (Signed)
PROGRESS NOTE    [name]  DTO:[id number] DOB: 02/01/1927 DOA: 04/22/2018 PCP: Lemmie Evens, MD    Brief Narrative:  84 y.o. male, w h/o Dementia,  dm2, CKD stage 4, Gerd, Barrett's esophagus, anemia, apparently presents with altered mental status , found to be hypoglycemic,  Pt arrived in ED after glucagon with bs 78.  Pt febrile in ED.   In Ed,  T 103.4  P 121  Bp 139/57  Pox 74% ?  CXR IMPRESSION: Stable small left pleural effusion with associated basilar atelectasis   Assessment & Plan: 1-Sepsis [facility]): patient met sepsis criteria on admission; appears to be associated with PNA. -most likely aspiration in nature -broad spectrum antibiotics given initially  -speech therapy consulted and recommended dysphagia 2 diet. -Currently on ceftriaxone due to organisms identified on blood cultures -will continue supportive care -follow clinical response  2-acute toxic/metabolic encephalopathy/deconditioning  -due to infection/fever, worsening renal function and low CBG's -Patient is still somnolent, but easily aroused and more interactive  -unsure of his baseline (had hx of dementia) -PT worked with him and recommended SNF  3-Acute on chronic renal failure [facility]): patient with CKD stage 4 at baseline -Cr improved some with IVF's; but now having signs of fluid overload. -Good urine output and improvement in his overall swelling. -will continue to minimize nephrotoxic agents -follow Cr trend -IV Lasix will be discontinued patient started on oral Demadex and metolazone.  4-DM (diabetes mellitus) ([place]): with nephropathy and presented with hypoglycemia -continue SSI for now -modified carb diet ordered  -will follow CBG's -at home he was using diet control only.  5-HTN (hypertension) -BP stable -will monitor VS  6-acute on chronic Anemia: due to renal failure -2 units PRBC's given -Hgb up to 8.5 and has remained stable. -no signs of overt bleeding -will follow Hgb  trend -Aranesp X1 ordered  7-Dementia ([place]) -no behavioral disturbance appreciated currently -will continue supportive care   8-GERD -continue PPI  9-BPH -continue flomax  10-staph Schleifer bacteremia. -Patient has 2/2 positve blood cultures for Staphylococcus schleiferi -Discussed with Dr. Megan Salon and he recommended to continue ceftriaxone for another day and switch to keflex for 7 days after that (will start Keflex today, 11/4). -Patient is afebrile and currently not demonstrating signs of systemic infections.  11-metabolic acidosis -started on sodium bicarb drip by renal service; due to fluid overload discontinued and started on PO bicarbonate repletion (660m BID -CO2 has remained at 16  12-anasarca. -Secondary to hypoalbuminemia. -Patient's swelling is improving -At this time plan is to transition of IV Lasix and start oral Demadex -Also continue metolazone -good urine output appreciated  -Appreciate renal service assistance.  DVT prophylaxis: SCD's Code Status: Full Code Family Communication: discussed with daughter in law over the phone. Encouraged them to start discussing code status Disposition Plan: plan is to discharge to SNF when patient is ready. Still has significant volume overload and being treated for bacteremia.  Consultants:   Nephrology  ID (Dr. CMegan Salon  Procedures:   See below for x-ray reports   Antimicrobials:  Anti-infectives (From admission, onward)   Start     Dose/Rate Route Frequency Ordered Stop   04/26/18 1600  cefTRIAXone (ROCEPHIN) 2 g in sodium chloride 0.9 % 100 mL IVPB     2 g 200 mL/hr over 30 Minutes Intravenous Every 24 hours 04/26/18 1551     04/25/18 0700  vancomycin (VANCOCIN) IVPB 1000 mg/200 mL premix  Status:  Discontinued     1,000 mg 200 mL/hr over  60 Minutes Intravenous Every 48 hours 04/23/18 0737 04/24/18 1658   04/25/18 0700  vancomycin (VANCOCIN) IVPB 1000 mg/200 mL premix  Status:  Discontinued     1,000  mg 200 mL/hr over 60 Minutes Intravenous Every 48 hours 04/24/18 1749 04/25/18 1526   04/24/18 2200  amoxicillin-clavulanate (AUGMENTIN) 500-125 MG per tablet 500 mg  Status:  Discontinued     1 tablet Oral Every 12 hours 04/24/18 1658 04/26/18 1551   04/24/18 0100  ceFEPIme (MAXIPIME) 1 g in sodium chloride 0.9 % 100 mL IVPB  Status:  Discontinued     1 g 200 mL/hr over 30 Minutes Intravenous Every 24 hours 04/23/18 0739 04/24/18 1658   04/23/18 0445  vancomycin (VANCOCIN) 1,500 mg in sodium chloride 0.9 % 500 mL IVPB     1,500 mg 250 mL/hr over 120 Minutes Intravenous  Once 04/23/18 0440 04/23/18 0707   04/23/18 0115  ceFEPIme (MAXIPIME) 2 g in sodium chloride 0.9 % 100 mL IVPB     2 g 200 mL/hr over 30 Minutes Intravenous  Once 04/23/18 0100 04/23/18 0325      Subjective: Patient without acute complaints.  Lethargic but able to follow commands and answer questions when aroused.  No fever.  No nausea, no vomiting.  Very hard of hearing.  Objective: Vitals:   04/28/18 0556 04/28/18 0840 04/28/18 0946 04/28/18 1325  BP: (!) 139/56 (!) 164/64  (!) 162/45  Pulse: 78 71  80  Resp: 20   20  Temp: 98.7 F (37.1 C)   98.6 F (37 C)  TempSrc: Oral   Oral  SpO2: 95%  96% 98%  Weight:      Height:        Intake/Output Summary (Last 24 hours) at 04/28/2018 1823 Last data filed at 04/28/2018 1808 Gross per 24 hour  Intake 1470.4 ml  Output 3650 ml  Net -2179.6 ml   Filed Weights   04/26/18 0500 04/27/18 0500 04/28/18 0500  Weight: 81.3 kg 82.5 kg 82.7 kg    Examination: General exam: Alert, awake, oriented x 1; hard of hearing, denies chest pain, reports no acute complaints.  Mildly tachypneic appreciated during examination. Respiratory system: Bibasilar crackles, Mild expiratory wheezing.  No using accessory muscles.  Normal respiratory effort.  Cardiovascular system:RRR. No rubs or gallops.  Positive systolic ejection murmur.   Gastrointestinal system: Abdomen is nondistended,  soft and nontender. No organomegaly or masses felt. Normal bowel sounds heard. Central nervous system: Alert and oriented. No focal neurological deficits. Extremities: No cyanosis or clubbing.  1+ edema bilaterally appreciated. Skin: No rashes, no petechiae.  Patient with a scrotal swelling (Improving).   Psychiatry: Judgement and insight appears impaired by underlying dementia.     Data Reviewed: I have personally reviewed following labs and imaging studies  CBC: Recent Labs  Lab 04/22/18 2350 04/23/18 0712 04/24/18 0401 04/25/18 0410 04/27/18 0637  WBC 12.3* 10.8* 6.5 5.6 8.5  NEUTROABS 11.4*  --   --   --   --   HGB 6.6* 6.1* 8.5* 8.5* 9.2*  HCT 22.7* 21.2* 28.0* 28.8* 31.0*  MCV 101.3* 102.4* 95.2 97.0 96.9  PLT 199 171 166 170 597   Basic Metabolic Panel: Recent Labs  Lab 04/23/18 1314 04/24/18 0401 04/25/18 0410 04/26/18 0644 04/26/18 1844 04/27/18 0637 04/28/18 0539  NA  --  140 138 141  --  139 138  K  --  4.6 4.7 4.4  --  4.7 4.8  CL  --  122* 122*  121*  --  117* 115*  CO2  --  12* 12* 16*  --  15* 16*  GLUCOSE  --  114* 96 88  --  105* 120*  BUN  --  59* 62* 63*  --  67* 67*  CREATININE  --  3.14* 3.31* 3.25*  --  3.20* 3.38*  3.28*  CALCIUM  --  8.1* 8.0* 8.0*  --  8.0* 8.0*  MG 2.0  --   --   --  1.6*  --   --   PHOS  --   --   --  3.8  --  4.3 5.0*   GFR: Estimated Creatinine Clearance: 16 mL/min (A) (by C-G formula based on SCr of 3.28 mg/dL (H)).   Liver Function Tests: Recent Labs  Lab 04/22/18 2350 04/23/18 0712 04/26/18 0644 04/27/18 0637 04/28/18 0539  AST 73* 60*  --   --   --   ALT 35 30  --   --   --   ALKPHOS 93 76  --   --   --   BILITOT 1.1 0.8  --   --   --   PROT 6.8 5.6*  --   --   --   ALBUMIN 3.1* 2.6* 2.1* 2.2* 2.2*   Cardiac Enzymes: Recent Labs  Lab 04/22/18 2350 04/23/18 0712 04/23/18 1314  CKTOTAL 722*  --   --   CKMB 9.5*  --   --   TROPONINI 0.34* 0.33* 0.26*   CBG: Recent Labs  Lab 04/28/18 0037  04/28/18 0428 04/28/18 0837 04/28/18 1125 04/28/18 1608  GLUCAP 155* 110* 109* 118* 126*   Urine analysis:    Component Value Date/Time   COLORURINE YELLOW 04/22/2018 2333   APPEARANCEUR CLOUDY (A) 04/22/2018 2333   LABSPEC 1.011 04/22/2018 2333   PHURINE 5.0 04/22/2018 2333   GLUCOSEU NEGATIVE 04/22/2018 2333   HGBUR MODERATE (A) 04/22/2018 2333   BILIRUBINUR NEGATIVE 04/22/2018 2333   KETONESUR 5 (A) 04/22/2018 2333   PROTEINUR NEGATIVE 04/22/2018 2333   UROBILINOGEN 0.2 11/28/2012 1626   NITRITE NEGATIVE 04/22/2018 2333   LEUKOCYTESUR NEGATIVE 04/22/2018 2333    Recent Results (from the past 240 hour(s))  Urine culture     Status: None   Collection Time: 04/22/18 11:33 PM  Result Value Ref Range Status   Specimen Description   Final    URINE, CLEAN CATCH Performed at [facility], [address], [place], [place] [address]    Special Requests   Final    Normal Performed at [facility], [address], [place], [place] [address]    Culture   Final    NO GROWTH Performed at [facility], [place] [address] [place], [place] [address]    Report Status 04/24/2018 FINAL  Final  Blood Culture (routine x 2)     Status: Abnormal   Collection Time: 04/22/18 11:51 PM  Result Value Ref Range Status   Specimen Description   Final    BLOOD LEFT FOREARM Performed at [facility], [address] [place], [place] [address]    Special Requests   Final    BOTTLES DRAWN AEROBIC AND ANAEROBIC Blood Culture adequate volume Performed at [facility], [address] [place], [place] [address]    Culture  Setup Time   Final    GRAM POSITIVE COCCI Gram Stain Report Called to,Read Back By and Verified With: SMITH,J. AT 0768 ON10/30/2019 BY EVA IN BOTH AEROBIC AND ANAEROBIC BOTTLES Performed at [facility], [place] [address], [place], [place] [address]    Culture STAPHYLOCOCCUS SCHLEIFERI (A)  Final   Report Status 04/27/2018 FINAL  Final   Organism ID, Bacteria STAPHYLOCOCCUS  SCHLEIFERI  Final      Susceptibility   Staphylococcus schleiferi - MIC*    CIPROFLOXACIN <=0.5 SENSITIVE Sensitive     ERYTHROMYCIN <=0.25 SENSITIVE Sensitive     GENTAMICIN <=0.5 SENSITIVE Sensitive     OXACILLIN <=0.25 SENSITIVE Sensitive     TETRACYCLINE <=1 SENSITIVE Sensitive     VANCOMYCIN 1 SENSITIVE Sensitive     TRIMETH/SULFA <=10 SENSITIVE Sensitive     CLINDAMYCIN <=0.25 SENSITIVE Sensitive     RIFAMPIN <=0.5 SENSITIVE Sensitive     Inducible Clindamycin NEGATIVE Sensitive     * STAPHYLOCOCCUS SCHLEIFERI  Blood Culture (routine x 2)     Status: Abnormal   Collection Time: 04/22/18 11:52 PM  Result Value Ref Range Status   Specimen Description   Final    BLOOD RIGHT HAND Performed at [facility], [address], [place], [place] [address]    Special Requests   Final    BOTTLES DRAWN AEROBIC AND ANAEROBIC Blood Culture adequate volume Performed at [facility], [address], [place], [place] [address]    Culture  Setup Time   Final    GRAM POSITIVE COCCI Gram Stain Report Called to,Read Back By and Verified With: SMITH,J. AT 5170 ON 04/23/2018 BY EVA AEROBIC BOTTLE ONLY Performed at [place] BY AND VERIFIED WITHMiquel Dunn RN 0174 04/24/18 A BROWNING Performed at [facility], [place] [address] [place], [place] [address]    Culture STAPHYLOCOCCUS SCHLEIFERI (A)  Final   Report Status 04/26/2018 FINAL  Final   Organism ID, Bacteria STAPHYLOCOCCUS SCHLEIFERI  Final      Susceptibility   Staphylococcus schleiferi - MIC*    CIPROFLOXACIN <=0.5 SENSITIVE Sensitive     ERYTHROMYCIN <=0.25 SENSITIVE Sensitive     GENTAMICIN <=0.5 SENSITIVE Sensitive     OXACILLIN <=0.25 SENSITIVE Sensitive     TETRACYCLINE <=1 SENSITIVE Sensitive     VANCOMYCIN <=0.5 SENSITIVE Sensitive     TRIMETH/SULFA <=10 SENSITIVE Sensitive     CLINDAMYCIN <=0.25 SENSITIVE Sensitive     RIFAMPIN <=0.5 SENSITIVE Sensitive     Inducible Clindamycin  NEGATIVE Sensitive     * STAPHYLOCOCCUS SCHLEIFERI  Blood Culture ID Panel (Reflexed)     Status: None   Collection Time: 04/22/18 11:52 PM  Result Value Ref Range Status   Enterococcus species NOT DETECTED NOT DETECTED Final   Listeria monocytogenes NOT DETECTED NOT DETECTED Final   Staphylococcus species NOT DETECTED NOT DETECTED Final   Staphylococcus aureus (BCID) NOT DETECTED NOT DETECTED Final   Streptococcus species NOT DETECTED NOT DETECTED Final   Streptococcus agalactiae NOT DETECTED NOT DETECTED Final   Streptococcus pneumoniae NOT DETECTED NOT DETECTED Final   Streptococcus pyogenes NOT DETECTED NOT DETECTED Final   Acinetobacter baumannii NOT DETECTED NOT DETECTED Final   Enterobacteriaceae species NOT DETECTED NOT DETECTED Final   Enterobacter cloacae complex NOT DETECTED NOT DETECTED Final   Escherichia coli NOT DETECTED NOT DETECTED Final   Klebsiella oxytoca NOT DETECTED NOT DETECTED Final   Klebsiella pneumoniae NOT DETECTED NOT DETECTED Final   Proteus species NOT DETECTED NOT DETECTED Final   Serratia marcescens NOT DETECTED NOT DETECTED Final   Haemophilus influenzae NOT DETECTED NOT DETECTED Final   Neisseria meningitidis NOT DETECTED NOT DETECTED Final  Pseudomonas aeruginosa NOT DETECTED NOT DETECTED Final   Candida albicans NOT DETECTED NOT DETECTED Final   Candida glabrata NOT DETECTED NOT DETECTED Final   Candida krusei NOT DETECTED NOT DETECTED Final   Candida parapsilosis NOT DETECTED NOT DETECTED Final   Candida tropicalis NOT DETECTED NOT DETECTED Final    Comment: Performed at [facility], [place] [address] [place], [place] [address]  MRSA PCR Screening     Status: None   Collection Time: 04/23/18  5:50 PM  Result Value Ref Range Status   MRSA by PCR NEGATIVE NEGATIVE Final    Comment:        The GeneXpert MRSA Assay (FDA approved for NASAL specimens only), is one component of a comprehensive MRSA colonization surveillance program. It is  not intended to diagnose MRSA infection nor to guide or monitor treatment for MRSA infections. Performed at [facility], [address] [place], [place] [address]      Radiology Studies: No results found.  Scheduled Meds: . amLODipine  5 mg Oral Daily  . aspirin  325 mg Oral Q breakfast  . carvedilol  6.25 mg Oral BID WC  . ferrous sulfate  325 mg Oral BID WC  . folic acid  1 mg Oral Daily  . insulin aspart  0-9 Units Subcutaneous Q4H  . metolazone  5 mg Oral Daily  . pantoprazole  40 mg Oral BID  . polyethylene glycol  17 g Oral Daily  . senna-docusate  1 tablet Oral BID  . sodium bicarbonate  650 mg Oral BID  . tamsulosin  0.4 mg Oral Daily  . torsemide  60 mg Oral Daily   Continuous Infusions: . cefTRIAXone (ROCEPHIN)  IV Stopped (04/28/18 1717)     LOS: 5 days    Time spent: 30 minutes    Barton Dubois, MD [facility] Pager [phone]  If 7PM-7AM, please contact night-coverage [email or web address] Password [facility] 04/28/2018, 6:23 PM

## 2018-04-29 LAB — RENAL FUNCTION PANEL
ALBUMIN: 2.1 g/dL — AB (ref 3.5–5.0)
ALBUMIN: 2.2 g/dL — AB (ref 3.5–5.0)
ANION GAP: 9 (ref 5–15)
ANION GAP: 9 (ref 5–15)
BUN: 77 mg/dL — ABNORMAL HIGH (ref 8–23)
BUN: 78 mg/dL — ABNORMAL HIGH (ref 8–23)
CALCIUM: 8.2 mg/dL — AB (ref 8.9–10.3)
CHLORIDE: 114 mmol/L — AB (ref 98–111)
CO2: 17 mmol/L — ABNORMAL LOW (ref 22–32)
CO2: 16 mmol/L — AB (ref 22–32)
CREATININE: 3.38 mg/dL — AB (ref 0.61–1.24)
Calcium: 8 mg/dL — ABNORMAL LOW (ref 8.9–10.3)
Chloride: 114 mmol/L — ABNORMAL HIGH (ref 98–111)
Creatinine, Ser: 3.39 mg/dL — ABNORMAL HIGH (ref 0.61–1.24)
GFR calc non Af Amer: 15 mL/min — ABNORMAL LOW (ref >60)
GFR, EST AFRICAN AMERICAN: 17 mL/min — AB (ref >60)
GFR, EST AFRICAN AMERICAN: 17 mL/min — AB (ref >60)
GFR, EST NON AFRICAN AMERICAN: 15 mL/min — AB (ref >60)
Glucose, Bld: 108 mg/dL — ABNORMAL HIGH (ref 70–99)
Glucose, Bld: 109 mg/dL — ABNORMAL HIGH (ref 70–99)
PHOSPHORUS: 5.1 mg/dL — AB (ref 2.5–4.6)
PHOSPHORUS: 5.2 mg/dL — AB (ref 2.5–4.6)
POTASSIUM: 4.6 mmol/L (ref 3.5–5.1)
Potassium: 4.7 mmol/L (ref 3.5–5.1)
SODIUM: 139 mmol/L (ref 135–145)
Sodium: 140 mmol/L (ref 135–145)

## 2018-04-29 LAB — GLUCOSE, CAPILLARY
GLUCOSE-CAPILLARY: 145 mg/dL — AB (ref 70–99)
GLUCOSE-CAPILLARY: 129 mg/dL — AB (ref 70–99)
Glucose-Capillary: 101 mg/dL — ABNORMAL HIGH (ref 70–99)
Glucose-Capillary: 96 mg/dL (ref 70–99)
Glucose-Capillary: 124 mg/dL — ABNORMAL HIGH (ref 70–99)

## 2018-04-29 MED ORDER — SODIUM BICARBONATE 650 MG PO TABS
650.0000 mg | ORAL_TABLET | Freq: Three times a day (TID) | ORAL | Status: DC
  Administered 2018-04-29 – 2018-05-04 (×16): 650 mg via ORAL
  Filled 2018-04-29 (×16): qty 1

## 2018-04-29 NOTE — Progress Notes (Signed)
Subjective: Interval History: Patient is very sleepy today.  Apparently arousable and does not talk.  Objective: Vital signs in last 24 hours: Temp:  [97.7 F (36.5 C)-98.8 F (37.1 C)] 98.8 F (37.1 C) (11/05 0605) Pulse Rate:  [71-82] 82 (11/05 0605) Resp:  [15-22] 15 (11/05 0605) BP: (132-164)/(45-64) 138/59 (11/05 0605) SpO2:  [94 %-98 %] 96 % (11/05 0605) Weight:  [80 kg] 80 kg (11/05 0500) Weight change: -2.7 kg  Intake/Output from previous day: 11/04 0701 - 11/05 0700 In: 1710.4 [P.O.:1510; IV Piggyback:200.4] Out: 3750 [Urine:3750] Intake/Output this shift: No intake/output data recorded.  Generally: Patient is very sleepy this morning. Chest: He has some inspiratory crackles Heart exam revealed regular rate and rhythm Extremities he has possibly 1+ edema  Lab Results: Recent Labs    04/27/18 0637  WBC 8.5  HGB 9.2*  HCT 31.0*  PLT 170   BMET:  Recent Labs    04/28/18 0539 04/29/18 0434  NA 138 139  140  K 4.8 4.7  4.6  CL 115* 114*  114*  CO2 16* 16*  17*  GLUCOSE 120* 108*  109*  BUN 67* 77*  78*  CREATININE 3.38*  3.28* 3.38*  3.39*  CALCIUM 8.0* 8.2*  8.0*   No results for input(s): PTH in the last 72 hours. Iron Studies: No results for input(s): IRON, TIBC, TRANSFERRIN, FERRITIN in the last 72 hours.  Studies/Results: No results found.  I have reviewed the patient's current medications.  Assessment/Plan: 1] possibly acute kidney injury superimposed on chronic.  His renal function remains a stable.  His renal function is at his baseline. 2] fluid management: Patient on Demadex and metolazone orally.. He has 3700 cc of urine output.  Patient seems to be comfortable. 3] low CO2: Possibly metabolic acidosis.  Patient is started on sodium bicarbonate he CO2 is 16 remains low. 4] bone and mineral disorder: His calcium and phosphorus is range 5] hypertension: His blood pressure is reasonably controlled 6] history of diabetes : His blood  sugar is reasonably controlled 7] anemia: Anemia secondary to chronic renal failure.  Patient is on Aranesp as an outpatient.  His hemoglobin is stable. Plan: 1] we will continue with metolazone and Demadex 2] we will check his renal panel and CBC in the morning.    LOS: 6 days   [name] S 04/29/2018,8:23 AM

## 2018-04-29 NOTE — Clinical Social Work Note (Signed)
LCSW following. Pt discussed in Progression. Pt not yet stable for dc. Working on SNF placement for pt. Spoke with Jackelyn Poling at [facility] by phone to inquire on status of pt's referral. Awaiting decision at this time.

## 2018-04-29 NOTE — Care Management Note (Signed)
Case Management Note  Patient Details  Name: MALIN CERVINI MRN: [id number] Date of Birth: [birth year]  If discussed at Long Length of Stay Meetings, dates discussed:  04/29/18  Additional Comments:  Sherald Barge, RN 04/29/2018, 12:37 PM

## 2018-04-29 NOTE — Progress Notes (Signed)
Patient with expiratory wheezing and shortness of breath upon assessment, O2 sats 96 on room air. RRT aware of need for breathing treatment and providing currently, see MAR. Will continue to monitor.

## 2018-04-29 NOTE — Progress Notes (Signed)
PROGRESS NOTE    [name]  YNW:[id number] DOB: 09-17-1926 DOA: 04/22/2018 PCP: Lemmie Evens, MD    Brief Narrative:  84 y.o. male, w h/o Dementia,  dm2, CKD stage 4, Gerd, Barrett's esophagus, anemia, apparently presents with altered mental status , found to be hypoglycemic,  Pt arrived in ED after glucagon with bs 78.  Pt febrile in ED.   In Ed,  T 103.4  P 121  Bp 139/57  Pox 74% ?  CXR IMPRESSION: Stable small left pleural effusion with associated basilar atelectasis   Assessment & Plan: 1-Sepsis [facility]): patient met sepsis criteria on admission; appears to be associated with PNA. -most likely aspiration in nature; patient found with dysphagia and moderate risk for aspiration with ongoing dementia.  -broad spectrum antibiotics given initially; then started on Augmentin; given blood cx's findings received ceftriaxone for 48 hours and ID has recommended oral Keflex for a total of 7 days now. See below. -continue dysphagia 2 diet. -will continue supportive care -follow clinical response  2-acute toxic/metabolic encephalopathy/deconditioning  -due to infection/fever, worsening renal function and low CBG's -Patient is still somnolent, but once aroused answer questions appropriately   -unsure of his baseline (had hx of dementia) -PT worked with him and recommended SNF -palliative care service consulted for Gerald and advance directives recommendations.  3-Acute on chronic renal failure [facility]): patient with CKD stage 4 at baseline -Cr improved some with IVF's; but then developed fluid overload and required to to be discontiued. -Good urine output and continue slowly improving overall swelling. -will continue to minimize nephrotoxic agents -follow Cr trend -continue Demadex and metolazone.  4-DM (diabetes mellitus) ([place]): with nephropathy and presented with hypoglycemia -continue SSI for now -modified carb diet ordered  -will follow CBG's -at home he was using diet  control only.  5-HTN (hypertension) -overall BP stable -will monitor VS  6-acute on chronic Anemia: due to renal failure -2 units PRBC's given -Hgb up to 8.5 and has remained stable. -no signs of overt bleeding -will follow Hgb trend -Aranesp X1 given  7-Dementia (HCC) -no behavioral disturbance appreciated currently -will continue supportive care   8-GERD -continue PPI  9-BPH -continue flomax  10-staph Schleifer bacteremia. -Patient has 2/2 positve blood cultures for Staphylococcus schleiferi -Discussed with Dr. Megan Salon and he recommended to continue ceftriaxone for another day and switch to keflex for 7 days after that (Keflex started on 11/4; coursing day 2 out of 7). -Patient is afebrile and currently not demonstrating signs of systemic infections.  11-metabolic acidosis -CO2 slowly improving; now 17 -continue sodium bicarbonate tablets.  12-anasarca. -Secondary to hypoalbuminemia. -Patient's swelling is improving -will continue oral demadex; good urine output reported  -Also continue metolazone -Appreciate renal service assistance. -follow renal function trend  DVT prophylaxis: SCD's Code Status: Full Code Family Communication: discussed with daughter in law over the phone. Encouraged them to start discussing code status Disposition Plan: plan is to discharge to SNF when patient is ready. Still has volume overload and metabolic acidosis. Receiving oral treatment for bacteremia as indicated by ID.  Consultants:   Nephrology  ID (Dr. Megan Salon)  Palliative Care.  Procedures:   See below for x-ray reports   Antimicrobials:  Anti-infectives (From admission, onward)   Start     Dose/Rate Route Frequency Ordered Stop   04/28/18 2200  cephALEXin (KEFLEX) capsule 250 mg     250 mg Oral Every 8 hours 04/28/18 1825 05/05/18 2159   04/26/18 1600  cefTRIAXone (ROCEPHIN) 2 g in sodium chloride  0.9 % 100 mL IVPB  Status:  Discontinued     2 g 200 mL/hr over 30  Minutes Intravenous Every 24 hours 04/26/18 1551 04/28/18 1825   04/25/18 0700  vancomycin (VANCOCIN) IVPB 1000 mg/200 mL premix  Status:  Discontinued     1,000 mg 200 mL/hr over 60 Minutes Intravenous Every 48 hours 04/23/18 0737 04/24/18 1658   04/25/18 0700  vancomycin (VANCOCIN) IVPB 1000 mg/200 mL premix  Status:  Discontinued     1,000 mg 200 mL/hr over 60 Minutes Intravenous Every 48 hours 04/24/18 1749 04/25/18 1526   04/24/18 2200  amoxicillin-clavulanate (AUGMENTIN) 500-125 MG per tablet 500 mg  Status:  Discontinued     1 tablet Oral Every 12 hours 04/24/18 1658 04/26/18 1551   04/24/18 0100  ceFEPIme (MAXIPIME) 1 g in sodium chloride 0.9 % 100 mL IVPB  Status:  Discontinued     1 g 200 mL/hr over 30 Minutes Intravenous Every 24 hours 04/23/18 0739 04/24/18 1658   04/23/18 0445  vancomycin (VANCOCIN) 1,500 mg in sodium chloride 0.9 % 500 mL IVPB     1,500 mg 250 mL/hr over 120 Minutes Intravenous  Once 04/23/18 0440 04/23/18 0707   04/23/18 0115  ceFEPIme (MAXIPIME) 2 g in sodium chloride 0.9 % 100 mL IVPB     2 g 200 mL/hr over 30 Minutes Intravenous  Once 04/23/18 0100 04/23/18 0325      Subjective: Afebrile.  No nausea, no vomiting.  Patient expressed breathing is overall better.  He was a slightly more lethargic in the morning, but once aroused was able to answer questions and was oriented x1.  Good urine output reported on I's and O's by nursing staff  Objective: Vitals:   04/28/18 2039 04/29/18 0500 04/29/18 0605 04/29/18 1439  BP: (!) 132/52  (!) 138/59 128/60  Pulse: 77  82 77  Resp: (!) 22  15 (!) 24  Temp: 97.7 F (36.5 C)  98.8 F (37.1 C) 98 F (36.7 C)  TempSrc: Oral  Oral Oral  SpO2: 94%  96% 95%  Weight:  80 kg    Height:        Intake/Output Summary (Last 24 hours) at 04/29/2018 1741 Last data filed at 04/29/2018 1300 Gross per 24 hour  Intake 1160.4 ml  Output 2800 ml  Net -1639.6 ml   Filed Weights   04/27/18 0500 04/28/18 0500 04/29/18 0500   Weight: 82.5 kg 82.7 kg 80 kg    Examination: General exam: Alert, awake, oriented x 1; patient was a slightly more lethargic today but once aroused reports no acute complaints.  He is afebrile.  Expressed breathing improved. Respiratory system: Fine crackles at the bases, mild expiratory wheezing.  No using accessory muscles.  Normal respiratory effort.  Good oxygen saturation on room air.   Cardiovascular system:RRR. No rubs or gallops.  Positive systolic ejection murmur. Gastrointestinal system: Abdomen is nondistended, soft and nontender. No organomegaly or masses felt. Normal bowel sounds heard. Central nervous system: Alert and oriented to person. No focal neurological deficits.  Moving 4 limbs. Extremities: No cyanosis.  1+ edema bilaterally appreciated. Skin: No open wounds. Scrotal swelling continue improving. Psychiatry: Judgement and insight appears impaired in the setting of underlying dementia (unknown baseline).  Normal. Mood & affect appropriate.    Data Reviewed: I have personally reviewed following labs and imaging studies  CBC: Recent Labs  Lab 04/22/18 2350 04/23/18 0712 04/24/18 0401 04/25/18 0410 04/27/18 0637  WBC 12.3* 10.8* 6.5 5.6 8.5  NEUTROABS 11.4*  --   --   --   --   HGB 6.6* 6.1* 8.5* 8.5* 9.2*  HCT 22.7* 21.2* 28.0* 28.8* 31.0*  MCV 101.3* 102.4* 95.2 97.0 96.9  PLT 199 171 166 170 578   Basic Metabolic Panel: Recent Labs  Lab 04/23/18 1314  04/25/18 0410 04/26/18 0644 04/26/18 1844 04/27/18 0637 04/28/18 0539 04/29/18 0434  NA  --    < > 138 141  --  139 138 139  140  K  --    < > 4.7 4.4  --  4.7 4.8 4.7  4.6  CL  --    < > 122* 121*  --  117* 115* 114*  114*  CO2  --    < > 12* 16*  --  15* 16* 16*  17*  GLUCOSE  --    < > 96 88  --  105* 120* 108*  109*  BUN  --    < > 62* 63*  --  67* 67* 77*  78*  CREATININE  --    < > 3.31* 3.25*  --  3.20* 3.38*  3.28* 3.38*  3.39*  CALCIUM  --    < > 8.0* 8.0*  --  8.0* 8.0* 8.2*   8.0*  MG 2.0  --   --   --  1.6*  --   --   --   PHOS  --   --   --  3.8  --  4.3 5.0* 5.1*  5.2*   < > = values in this interval not displayed.   GFR: Estimated Creatinine Clearance: 14.3 mL/min (A) (by C-G formula based on SCr of 3.39 mg/dL (H)).   Liver Function Tests: Recent Labs  Lab 04/22/18 2350 04/23/18 0712 04/26/18 0644 04/27/18 0637 04/28/18 0539 04/29/18 0434  AST 73* 60*  --   --   --   --   ALT 35 30  --   --   --   --   ALKPHOS 93 76  --   --   --   --   BILITOT 1.1 0.8  --   --   --   --   PROT 6.8 5.6*  --   --   --   --   ALBUMIN 3.1* 2.6* 2.1* 2.2* 2.2* 2.1*  2.2*   Cardiac Enzymes: Recent Labs  Lab 04/22/18 2350 04/23/18 0712 04/23/18 1314  CKTOTAL 722*  --   --   CKMB 9.5*  --   --   TROPONINI 0.34* 0.33* 0.26*   CBG: Recent Labs  Lab 04/28/18 2349 04/29/18 0433 04/29/18 0741 04/29/18 1118 04/29/18 1627  GLUCAP 109* 101* 96 124* 145*   Urine analysis:    Component Value Date/Time   COLORURINE YELLOW 04/22/2018 2333   APPEARANCEUR CLOUDY (A) 04/22/2018 2333   LABSPEC 1.011 04/22/2018 2333   PHURINE 5.0 04/22/2018 2333   GLUCOSEU NEGATIVE 04/22/2018 2333   HGBUR MODERATE (A) 04/22/2018 2333   BILIRUBINUR NEGATIVE 04/22/2018 2333   KETONESUR 5 (A) 04/22/2018 2333   PROTEINUR NEGATIVE 04/22/2018 2333   UROBILINOGEN 0.2 11/28/2012 1626   NITRITE NEGATIVE 04/22/2018 2333   LEUKOCYTESUR NEGATIVE 04/22/2018 2333    Recent Results (from the past 240 hour(s))  Urine culture     Status: None   Collection Time: 04/22/18 11:33 PM  Result Value Ref Range Status   Specimen Description   Final    URINE, CLEAN CATCH Performed at [facility],  [address], [place], [place] [address]    Special Requests   Final    Normal Performed at [facility], [address], [place], [place] [address]    Culture   Final    NO GROWTH Performed at [facility], [place] [address] [place], [place] [address]    Report Status 04/24/2018 FINAL  Final   Blood Culture (routine x 2)     Status: Abnormal   Collection Time: 04/22/18 11:51 PM  Result Value Ref Range Status   Specimen Description   Final    BLOOD LEFT FOREARM Performed at [facility], [address], [place], [place] [address]    Special Requests   Final    BOTTLES DRAWN AEROBIC AND ANAEROBIC Blood Culture adequate volume Performed at [facility], [address], [place], [place] [address]    Culture  Setup Time   Final    GRAM POSITIVE COCCI Gram Stain Report Called to,Read Back By and Verified With: SMITH,J. AT 1657 ON10/30/2019 BY EVA IN BOTH AEROBIC AND ANAEROBIC BOTTLES Performed at [facility], [place] [address], [place], [place] [address]    Culture STAPHYLOCOCCUS SCHLEIFERI (A)  Final   Report Status 04/27/2018 FINAL  Final   Organism ID, Bacteria STAPHYLOCOCCUS SCHLEIFERI  Final      Susceptibility   Staphylococcus schleiferi - MIC*    CIPROFLOXACIN <=0.5 SENSITIVE Sensitive     ERYTHROMYCIN <=0.25 SENSITIVE Sensitive     GENTAMICIN <=0.5 SENSITIVE Sensitive     OXACILLIN <=0.25 SENSITIVE Sensitive     TETRACYCLINE <=1 SENSITIVE Sensitive     VANCOMYCIN 1 SENSITIVE Sensitive     TRIMETH/SULFA <=10 SENSITIVE Sensitive     CLINDAMYCIN <=0.25 SENSITIVE Sensitive     RIFAMPIN <=0.5 SENSITIVE Sensitive     Inducible Clindamycin NEGATIVE Sensitive     * STAPHYLOCOCCUS SCHLEIFERI  Blood Culture (routine x 2)     Status: Abnormal   Collection Time: 04/22/18 11:52 PM  Result Value Ref Range Status   Specimen Description   Final    BLOOD RIGHT HAND Performed at [facility], [address], [place], [place] [address]    Special Requests   Final    BOTTLES DRAWN AEROBIC AND ANAEROBIC Blood Culture adequate volume Performed at [facility], [address], [place], [place] [address]    Culture  Setup Time   Final    GRAM POSITIVE COCCI Gram Stain Report Called to,Read Back By and Verified With: SMITH,J. AT 2992 ON 04/23/2018 BY EVA AEROBIC BOTTLE ONLY  Performed at [place] BY AND VERIFIED WITHMiquel Dunn RN [id number] 04/24/18 A BROWNING Performed at [facility], [place] [address], [place],  [address]    Culture STAPHYLOCOCCUS SCHLEIFERI (A)  Final   Report Status 04/26/2018 FINAL  Final   Organism ID, Bacteria STAPHYLOCOCCUS SCHLEIFERI  Final      Susceptibility   Staphylococcus schleiferi - MIC*    CIPROFLOXACIN <=0.5 SENSITIVE Sensitive     ERYTHROMYCIN <=0.25 SENSITIVE Sensitive     GENTAMICIN <=0.5 SENSITIVE Sensitive     OXACILLIN <=0.25 SENSITIVE Sensitive     TETRACYCLINE <=1 SENSITIVE Sensitive     VANCOMYCIN <=0.5 SENSITIVE Sensitive     TRIMETH/SULFA <=10 SENSITIVE Sensitive     CLINDAMYCIN <=0.25 SENSITIVE Sensitive     RIFAMPIN <=0.5 SENSITIVE Sensitive     Inducible Clindamycin NEGATIVE Sensitive     * STAPHYLOCOCCUS SCHLEIFERI  Blood Culture ID Panel (  Reflexed)     Status: None   Collection Time: 04/22/18 11:52 PM  Result Value Ref Range Status   Enterococcus species NOT DETECTED NOT DETECTED Final   Listeria monocytogenes NOT DETECTED NOT DETECTED Final   Staphylococcus species NOT DETECTED NOT DETECTED Final   Staphylococcus aureus (BCID) NOT DETECTED NOT DETECTED Final   Streptococcus species NOT DETECTED NOT DETECTED Final   Streptococcus agalactiae NOT DETECTED NOT DETECTED Final   Streptococcus pneumoniae NOT DETECTED NOT DETECTED Final   Streptococcus pyogenes NOT DETECTED NOT DETECTED Final   Acinetobacter baumannii NOT DETECTED NOT DETECTED Final   Enterobacteriaceae species NOT DETECTED NOT DETECTED Final   Enterobacter cloacae complex NOT DETECTED NOT DETECTED Final   Escherichia coli NOT DETECTED NOT DETECTED Final   Klebsiella oxytoca NOT DETECTED NOT DETECTED Final   Klebsiella pneumoniae NOT DETECTED NOT DETECTED Final   Proteus species NOT DETECTED NOT DETECTED Final   Serratia marcescens NOT DETECTED NOT DETECTED Final   Haemophilus influenzae  NOT DETECTED NOT DETECTED Final   Neisseria meningitidis NOT DETECTED NOT DETECTED Final   Pseudomonas aeruginosa NOT DETECTED NOT DETECTED Final   Candida albicans NOT DETECTED NOT DETECTED Final   Candida glabrata NOT DETECTED NOT DETECTED Final   Candida krusei NOT DETECTED NOT DETECTED Final   Candida parapsilosis NOT DETECTED NOT DETECTED Final   Candida tropicalis NOT DETECTED NOT DETECTED Final    Comment: Performed at [facility], [place] [address] [place], [place] [address]  MRSA PCR Screening     Status: None   Collection Time: 04/23/18  5:50 PM  Result Value Ref Range Status   MRSA by PCR NEGATIVE NEGATIVE Final    Comment:        The GeneXpert MRSA Assay (FDA approved for NASAL specimens only), is one component of a comprehensive MRSA colonization surveillance program. It is not intended to diagnose MRSA infection nor to guide or monitor treatment for MRSA infections. Performed at [facility], [address] [place], [place] [address]      Radiology Studies: No results found.  Scheduled Meds: . amLODipine  5 mg Oral Daily  . aspirin  325 mg Oral Q breakfast  . carvedilol  6.25 mg Oral BID WC  . cephALEXin  250 mg Oral Q8H  . ferrous sulfate  325 mg Oral BID WC  . folic acid  1 mg Oral Daily  . insulin aspart  0-9 Units Subcutaneous Q4H  . metolazone  5 mg Oral Daily  . pantoprazole  40 mg Oral BID  . polyethylene glycol  17 g Oral Daily  . senna-docusate  1 tablet Oral BID  . sodium bicarbonate  650 mg Oral TID  . tamsulosin  0.4 mg Oral Daily  . torsemide  60 mg Oral Daily   Continuous Infusions:    LOS: 6 days    Time spent: 30 minutes    Barton Dubois, MD [facility] Pager [phone]  If 7PM-7AM, please contact night-coverage [email or web address] Password [facility] 04/29/2018, 5:41 PM

## 2018-04-29 NOTE — Progress Notes (Signed)
Physical Therapy Treatment Patient Details Name: [name] MRN: [id number] DOB: 1926/06/26 Today's Date: 04/29/2018    History of Present Illness [name]  is a 84 y.o. male, w h/o Dementia,  dm2, CKD stage 4, Gerd, Barrett's esophagus, anemia, apparently presents with altered mental status , found to be hypoglycemic,  Pt arrived in ED after glucagon with bs 78.  Pt febrile in ED.     PT Comments    Pt was seen for follow up to evaluation with mobility and stretches to LE's.  Pt is quite lethargic and even had to be awoken again for therapy today.  He is stiff esp L hip and B an kles, noting his lack of ability to assist with rolling and scooting.  Continue acutely for progressive ex and balance in all postures.   Follow Up Recommendations  SNF;Supervision/Assistance - 24 hour;Supervision for mobility/OOB     Equipment Recommendations  None recommended by PT    Recommendations for Other Services       Precautions / Restrictions Precautions Precautions: Fall Precaution Comments: lethargic today Restrictions Weight Bearing Restrictions: No    Mobility  Bed Mobility Overal bed mobility: Needs Assistance Bed Mobility: Rolling Rolling: Max assist;Total assist         General bed mobility comments: pt was weak to assist and dependent to scoot up bed  Transfers                    Ambulation/Gait                 Stairs             Wheelchair Mobility    Modified Rankin (Stroke Patients Only)       Balance                                            Cognition Arousal/Alertness: Lethargic Behavior During Therapy: Flat affect Overall Cognitive Status: History of cognitive impairments - at baseline                                        Exercises General Exercises - Lower Extremity Ankle Circles/Pumps: AROM;Both;AAROM;5 reps Quad Sets: AROM;Both;10 reps Gluteal Sets: AROM;Both;10 reps Hip  ABduction/ADduction: AAROM;Both;10 reps    General Comments        Pertinent Vitals/Pain Pain Assessment: No/denies pain    Home Living                      Prior Function            PT Goals (current goals can now be found in the care plan section) Acute Rehab PT Goals Patient Stated Goal: none stated Progress towards PT goals: Progressing toward goals    Frequency    Min 3X/week      PT Plan Current plan remains appropriate    Co-evaluation              AM-PAC PT "6 Clicks" Daily Activity  Outcome Measure  Difficulty turning over in bed (including adjusting bedclothes, sheets and blankets)?: Unable Difficulty moving from lying on back to sitting on the side of the bed? : Unable Difficulty sitting down on and standing up from a chair with arms (e.g., wheelchair, bedside  commode, etc,.)?: Unable Help needed moving to and from a bed to chair (including a wheelchair)?: A Little Help needed walking in hospital room?: A Lot Help needed climbing 3-5 steps with a railing? : A Lot 6 Click Score: 10    End of Session   Activity Tolerance: Patient limited by fatigue;Patient limited by pain Patient left: in bed;with call bell/phone within reach;with bed alarm set Nurse Communication: Mobility status PT Visit Diagnosis: Unsteadiness on feet (R26.81);Other abnormalities of gait and mobility (R26.89);Muscle weakness (generalized) (M62.81)     Time: 4373-5789 PT Time Calculation (min) (ACUTE ONLY): 26 min  Charges:  $Therapeutic Exercise: 8-22 mins $Therapeutic Activity: 8-22 mins                     Ramond Dial 04/29/2018, 3:29 PM    3:31 PM, 04/29/18 Mee Hives, PT, MS Physical Therapist - [place] [phone] (Office)

## 2018-04-30 ENCOUNTER — Inpatient Hospital Stay (HOSPITAL_COMMUNITY): Payer: Medicare Other

## 2018-04-30 DIAGNOSIS — F0390 Unspecified dementia without behavioral disturbance: Secondary | ICD-10-CM

## 2018-04-30 DIAGNOSIS — J90 Pleural effusion, not elsewhere classified: Secondary | ICD-10-CM

## 2018-04-30 DIAGNOSIS — N179 Acute kidney failure, unspecified: Secondary | ICD-10-CM

## 2018-04-30 DIAGNOSIS — R627 Adult failure to thrive: Secondary | ICD-10-CM

## 2018-04-30 DIAGNOSIS — Z66 Do not resuscitate: Secondary | ICD-10-CM

## 2018-04-30 DIAGNOSIS — J690 Pneumonitis due to inhalation of food and vomit: Secondary | ICD-10-CM

## 2018-04-30 DIAGNOSIS — D649 Anemia, unspecified: Secondary | ICD-10-CM

## 2018-04-30 DIAGNOSIS — Z515 Encounter for palliative care: Secondary | ICD-10-CM

## 2018-04-30 DIAGNOSIS — A411 Sepsis due to other specified staphylococcus: Secondary | ICD-10-CM

## 2018-04-30 DIAGNOSIS — I10 Essential (primary) hypertension: Secondary | ICD-10-CM

## 2018-04-30 DIAGNOSIS — N184 Chronic kidney disease, stage 4 (severe): Secondary | ICD-10-CM

## 2018-04-30 DIAGNOSIS — Z7189 Other specified counseling: Secondary | ICD-10-CM

## 2018-04-30 DIAGNOSIS — G9341 Metabolic encephalopathy: Secondary | ICD-10-CM

## 2018-04-30 LAB — RENAL FUNCTION PANEL
Albumin: 2.1 g/dL — ABNORMAL LOW (ref 3.5–5.0)
Anion gap: 11 (ref 5–15)
BUN: 81 mg/dL — AB (ref 8–23)
CHLORIDE: 111 mmol/L (ref 98–111)
CO2: 17 mmol/L — ABNORMAL LOW (ref 22–32)
Calcium: 7.9 mg/dL — ABNORMAL LOW (ref 8.9–10.3)
Creatinine, Ser: 3.32 mg/dL — ABNORMAL HIGH (ref 0.61–1.24)
GFR calc Af Amer: 18 mL/min — ABNORMAL LOW (ref >60)
GFR calc non Af Amer: 15 mL/min — ABNORMAL LOW (ref >60)
GLUCOSE: 82 mg/dL (ref 70–99)
POTASSIUM: 4.7 mmol/L (ref 3.5–5.1)
Phosphorus: 4.8 mg/dL — ABNORMAL HIGH (ref 2.5–4.6)
Sodium: 139 mmol/L (ref 135–145)

## 2018-04-30 LAB — GLUCOSE, CAPILLARY
GLUCOSE-CAPILLARY: 122 mg/dL — AB (ref 70–99)
GLUCOSE-CAPILLARY: 104 mg/dL — AB (ref 70–99)
Glucose-Capillary: 124 mg/dL — ABNORMAL HIGH (ref 70–99)
Glucose-Capillary: 142 mg/dL — ABNORMAL HIGH (ref 70–99)
Glucose-Capillary: 76 mg/dL (ref 70–99)
Glucose-Capillary: 87 mg/dL (ref 70–99)

## 2018-04-30 LAB — URINALYSIS, COMPLETE (UACMP) WITH MICROSCOPIC
BILIRUBIN URINE: NEGATIVE
Bacteria, UA: NONE SEEN
Glucose, UA: NEGATIVE mg/dL
Hgb urine dipstick: NEGATIVE
Ketones, ur: NEGATIVE mg/dL
Leukocytes, UA: NEGATIVE
NITRITE: NEGATIVE
PH: 5.5 (ref 5.0–8.0)
Protein, ur: NEGATIVE mg/dL
RBC / HPF: NONE SEEN RBC/hpf (ref 0–5)
Specific Gravity, Urine: 1.015 (ref 1.005–1.030)

## 2018-04-30 LAB — PROTEIN / CREATININE RATIO, URINE
Creatinine, Urine: 28.44 mg/dL
Protein Creatinine Ratio: 0.53 mg/mg{Cre} — ABNORMAL HIGH (ref 0.00–0.15)
Total Protein, Urine: 15 mg/dL

## 2018-04-30 LAB — CK: CK TOTAL: 58 U/L (ref 49–397)

## 2018-04-30 LAB — FOLATE: Folate: 40.8 ng/mL (ref >5.9)

## 2018-04-30 LAB — AMMONIA: Ammonia: 22 umol/L (ref 9–35)

## 2018-04-30 MED ORDER — GLUCERNA SHAKE PO LIQD
237.0000 mL | Freq: Three times a day (TID) | ORAL | Status: DC
  Administered 2018-04-30 – 2018-05-04 (×10): 237 mL via ORAL

## 2018-04-30 NOTE — Progress Notes (Signed)
[name]  MRN: [id number]  DOB/AGE: May 07, 1927 84 y.o.  Primary Care Physician:Knowlton, Richardson Landry, MD  Admit date: 04/22/2018  Chief Complaint:  Chief Complaint  Patient presents with  . Altered Mental Status    S-Pt presented on  04/22/2018 with  Chief Complaint  Patient presents with  . Altered Mental Status  .    Pt laying comfortably in no apparent distress.    Meds . amLODipine  5 mg Oral Daily  . aspirin  325 mg Oral Q breakfast  . carvedilol  6.25 mg Oral BID WC  . cephALEXin  250 mg Oral Q8H  . ferrous sulfate  325 mg Oral BID WC  . folic acid  1 mg Oral Daily  . insulin aspart  0-9 Units Subcutaneous Q4H  . metolazone  5 mg Oral Daily  . pantoprazole  40 mg Oral BID  . polyethylene glycol  17 g Oral Daily  . senna-docusate  1 tablet Oral BID  . sodium bicarbonate  650 mg Oral TID  . tamsulosin  0.4 mg Oral Daily  . torsemide  60 mg Oral Daily      Physical Exam: Vital signs in last 24 hours: Temp:  [97.7 F (36.5 C)-99.1 F (37.3 C)] 99.1 F (37.3 C) (11/06 0700) Pulse Rate:  [77-79] 78 (11/06 0700) Resp:  [20-24] 20 (11/06 0700) BP: (128-134)/(50-60) 129/50 (11/06 0700) SpO2:  [93 %-96 %] 95 % (11/06 0700) Weight:  [75.1 kg] 75.1 kg (11/06 0500) Weight change: -4.9 kg Last BM Date: 04/27/18  Intake/Output from previous day: 11/05 0701 - 11/06 0700 In: 720 [P.O.:720] Out: 3250 [Urine:3250] No intake/output data recorded.   Physical Exam: General- pt is awake,follows commands Resp- No acute REsp distress,  Rhonchi+ CVS- S1S2 regular in rate and rhythm GIT- BS+, soft, NT, ND EXT-trace LE Edema, Cyanosis   Lab Results: hemoglobin 9.2 on  04/27/18  BMET Recent Labs    04/29/18 0434 04/30/18 0458  NA 139  140 139  K 4.7  4.6 4.7  CL 114*  114* 111  CO2 16*  17* 17*  GLUCOSE 108*  109* 82  BUN 77*  78* 81*  CREATININE 3.38*  3.39* 3.32*  CALCIUM 8.2*  8.0* 7.9*   Creat trend 2019    3.7=>3.4=>3.3( This admission)   1.7--2.9 ( July admission) 2018    2.3 2017    2.4 2014   1.5--1.9 2013   1.5--1.9 2012   1.5--2.7 2008   1.5   MICRO Recent Results (from the past 240 hour(s))  Urine culture     Status: None   Collection Time: 04/22/18 11:33 PM  Result Value Ref Range Status   Specimen Description   Final    URINE, CLEAN CATCH Performed at [facility], [address], [place], [place] [address]    Special Requests   Final    Normal Performed at [facility], [address], [place], [place] [address]    Culture   Final    NO GROWTH Performed at [facility], [place] [address] [place], [place] [address]    Report Status 04/24/2018 FINAL  Final  Blood Culture (routine x 2)     Status: Abnormal   Collection Time: 04/22/18 11:51 PM  Result Value Ref Range Status   Specimen Description   Final    BLOOD LEFT FOREARM Performed at [facility], [address] [place], [place] [address]    Special Requests   Final    BOTTLES  DRAWN AEROBIC AND ANAEROBIC Blood Culture adequate volume Performed at [facility], [address] [place], [place] [address]    Culture  Setup Time   Final    GRAM POSITIVE COCCI Gram Stain Report Called to,Read Back By and Verified With: SMITH,J. AT 4431 ON10/30/2019 BY EVA IN BOTH AEROBIC AND ANAEROBIC BOTTLES Performed at [facility], [place] [address], [place], [place] [address]    Culture STAPHYLOCOCCUS SCHLEIFERI (A)  Final   Report Status 04/27/2018 FINAL  Final   Organism ID, Bacteria STAPHYLOCOCCUS SCHLEIFERI  Final      Susceptibility   Staphylococcus schleiferi - MIC*    CIPROFLOXACIN <=0.5 SENSITIVE Sensitive     ERYTHROMYCIN <=0.25 SENSITIVE Sensitive     GENTAMICIN <=0.5 SENSITIVE Sensitive     OXACILLIN <=0.25 SENSITIVE Sensitive     TETRACYCLINE <=1 SENSITIVE Sensitive     VANCOMYCIN 1 SENSITIVE Sensitive     TRIMETH/SULFA <=10 SENSITIVE Sensitive     CLINDAMYCIN <=0.25 SENSITIVE Sensitive     RIFAMPIN <=0.5 SENSITIVE Sensitive      Inducible Clindamycin NEGATIVE Sensitive     * STAPHYLOCOCCUS SCHLEIFERI  Blood Culture (routine x 2)     Status: Abnormal   Collection Time: 04/22/18 11:52 PM  Result Value Ref Range Status   Specimen Description   Final    BLOOD RIGHT HAND Performed at [facility], [address] [place], [place] [address]    Special Requests   Final    BOTTLES DRAWN AEROBIC AND ANAEROBIC Blood Culture adequate volume Performed at [facility], [address], [place], [place] [address]    Culture  Setup Time   Final    GRAM POSITIVE COCCI Gram Stain Report Called to,Read Back By and Verified With: SMITH,J. AT 6712 ON 04/23/2018 BY EVA AEROBIC BOTTLE ONLY Performed at [facility] BY AND VERIFIED WITHMiquel Dunn RN [id number] 04/24/18 A BROWNING Performed at [facility], [place] [address], [place], [place] [address]    Culture STAPHYLOCOCCUS SCHLEIFERI (A)  Final   Report Status 04/26/2018 FINAL  Final   Organism ID, Bacteria STAPHYLOCOCCUS SCHLEIFERI  Final      Susceptibility   Staphylococcus schleiferi - MIC*    CIPROFLOXACIN <=0.5 SENSITIVE Sensitive     ERYTHROMYCIN <=0.25 SENSITIVE Sensitive     GENTAMICIN <=0.5 SENSITIVE Sensitive     OXACILLIN <=0.25 SENSITIVE Sensitive     TETRACYCLINE <=1 SENSITIVE Sensitive     VANCOMYCIN <=0.5 SENSITIVE Sensitive     TRIMETH/SULFA <=10 SENSITIVE Sensitive     CLINDAMYCIN <=0.25 SENSITIVE Sensitive     RIFAMPIN <=0.5 SENSITIVE Sensitive     Inducible Clindamycin NEGATIVE Sensitive     * STAPHYLOCOCCUS SCHLEIFERI  Blood Culture ID Panel (Reflexed)     Status: None   Collection Time: 04/22/18 11:52 PM  Result Value Ref Range Status   Enterococcus species NOT DETECTED NOT DETECTED Final   Listeria monocytogenes NOT DETECTED NOT DETECTED Final   Staphylococcus species NOT DETECTED NOT DETECTED Final   Staphylococcus aureus (BCID) NOT DETECTED NOT DETECTED Final   Streptococcus species NOT DETECTED NOT DETECTED  Final   Streptococcus agalactiae NOT DETECTED NOT DETECTED Final   Streptococcus pneumoniae NOT DETECTED NOT DETECTED Final   Streptococcus pyogenes NOT DETECTED NOT DETECTED Final   Acinetobacter baumannii NOT DETECTED NOT DETECTED Final   Enterobacteriaceae species NOT DETECTED NOT DETECTED Final   Enterobacter cloacae complex NOT DETECTED NOT DETECTED Final   Escherichia  coli NOT DETECTED NOT DETECTED Final   Klebsiella oxytoca NOT DETECTED NOT DETECTED Final   Klebsiella pneumoniae NOT DETECTED NOT DETECTED Final   Proteus species NOT DETECTED NOT DETECTED Final   Serratia marcescens NOT DETECTED NOT DETECTED Final   Haemophilus influenzae NOT DETECTED NOT DETECTED Final   Neisseria meningitidis NOT DETECTED NOT DETECTED Final   Pseudomonas aeruginosa NOT DETECTED NOT DETECTED Final   Candida albicans NOT DETECTED NOT DETECTED Final   Candida glabrata NOT DETECTED NOT DETECTED Final   Candida krusei NOT DETECTED NOT DETECTED Final   Candida parapsilosis NOT DETECTED NOT DETECTED Final   Candida tropicalis NOT DETECTED NOT DETECTED Final    Comment: Performed at [facility], [place] [address], [place], [place] [address]  MRSA PCR Screening     Status: None   Collection Time: 04/23/18  5:50 PM  Result Value Ref Range Status   MRSA by PCR NEGATIVE NEGATIVE Final    Comment:        The GeneXpert MRSA Assay (FDA approved for NASAL specimens only), is one component of a comprehensive MRSA colonization surveillance program. It is not intended to diagnose MRSA infection nor to guide or monitor treatment for MRSA infections. Performed at [facility], [address] [place], [place] [address]       Lab Results  Component Value Date   CALCIUM 7.9 (L) 04/30/2018   PHOS 4.8 (H) 04/30/2018               Impression: 1)Renal  AKI secondary to ATN                AKI sec to sepsis                NON Oliguric ATN                AKI on CKD               CKD stage  4.               CKD since 2012               CKD secondary to DM/Age asso decline                Progression of CKD marked with AKI              2)HTN   stable  Medication-  On Diuretics. On Calcium Channel Blockers On Alpha and beta Blockers.  3)Anemia HGb at goal (9--11)   4)CKD Mineral-Bone Disorder  Phosphorus near  to goal. Calcium when corrected for low albumin is at goal.  5)ID-admitted with sepsis Primary MD following  6)Electrolytes Normokalemic NOrmonatremic   7)Acid base Co2 not  at goal But better   8) Social-Pt undergoing palliative care consult    Possibly home with hospiace   Plan:  Will continue current care     [place] S 04/30/2018, 9:16 AM

## 2018-04-30 NOTE — Progress Notes (Signed)
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 (Comment)   [facility] IP OTHER RECOMMENDATIONS 04/25/2018 Recommended Consults Consider GI evaluation Oral Care Recommendations Oral care BID Other Recommendations --   [facility] IP FOLLOW UP RECOMMENDATIONS 04/25/2018 Follow up Recommendations 24 hour supervision/assistance;Skilled Nursing facility   [facility] IP FREQUENCY AND DURATION 04/25/2018 Speech Therapy Frequency (ACUTE ONLY) min 2x/week Treatment Duration 2 weeks      [facility] IP ORAL PHASE 04/25/2018 Oral Phase Impaired Oral - Pudding Teaspoon -- Oral - Pudding Cup -- Oral - Honey Teaspoon -- Oral - Honey Cup -- Oral - Nectar Teaspoon -- Oral - Nectar Cup -- Oral - Nectar Straw -- Oral - Thin Teaspoon Piecemeal swallowing;Holding of bolus;Lingual pumping;Delayed oral transit;Premature spillage Oral - Thin Cup Piecemeal swallowing;Holding of bolus;Lingual pumping;Delayed oral  transit;Premature spillage Oral - Thin Straw Piecemeal swallowing;Holding of bolus;Lingual  pumping;Delayed oral transit;Premature spillage Oral - Puree Piecemeal swallowing;Holding of bolus;Lingual pumping;Delayed oral transit;Premature spillage;Decreased bolus cohesion Oral - Mech Soft Piecemeal swallowing;Holding of bolus;Lingual pumping;Delayed oral transit;Premature spillage Oral - Regular -- Oral - Multi-Consistency -- Oral - Pill Piecemeal swallowing;Holding of bolus;Lingual pumping;Delayed oral transit;Premature spillage Oral Phase - Comment --  CHL IP PHARYNGEAL PHASE 04/25/2018 Pharyngeal Phase Impaired Pharyngeal- Pudding Teaspoon -- Pharyngeal -- Pharyngeal- Pudding Cup -- Pharyngeal -- Pharyngeal- Honey Teaspoon -- Pharyngeal -- Pharyngeal- Honey Cup -- Pharyngeal -- Pharyngeal- Nectar Teaspoon -- Pharyngeal -- Pharyngeal- Nectar Cup -- Pharyngeal -- Pharyngeal- Nectar Straw -- Pharyngeal -- Pharyngeal- Thin Teaspoon Delayed swallow initiation-pyriform sinuses;Pharyngeal residue - valleculae Pharyngeal -- Pharyngeal- Thin Cup Delayed swallow initiation-pyriform sinuses;Pharyngeal residue - valleculae Pharyngeal -- Pharyngeal- Thin Straw Pharyngeal residue - valleculae;Delayed swallow initiation-pyriform sinuses;Penetration/Aspiration during swallow Pharyngeal Material enters airway, CONTACTS cords and then ejected out Pharyngeal- Puree Delayed swallow initiation-vallecula Pharyngeal -- Pharyngeal- Mechanical Soft Delayed swallow initiation-vallecula Pharyngeal -- Pharyngeal- Regular -- Pharyngeal -- Pharyngeal- Multi-consistency -- Pharyngeal -- Pharyngeal- Pill Delayed swallow initiation-pyriform sinuses;Pharyngeal residue - valleculae Pharyngeal -- Pharyngeal Comment --  [facility]L IP CERVICAL ESOPHAGEAL PHASE 04/25/2018 Cervical Esophageal Phase Impaired Pudding Teaspoon -- Pudding Cup -- Honey Teaspoon -- Honey Cup -- Nectar Teaspoon -- Nectar Cup -- Nectar Straw -- Thin Teaspoon -- Thin Cup -- Thin Straw (No  Data) Puree -- Mechanical Soft -- Regular -- Multi-consistency -- Pill -- Cervical Esophageal Comment -- Amelia H. Roddie Mc, CCC-SLP Speech Language Pathologist Wende Bushy 04/25/2018, 5:41 PM               Orson Eva, DO  [facility] Pager [phone]  If 7PM-7AM, please contact night-coverage [email or web address] Password [email or web address] 04/30/2018, 10:52 AM   LOS: 7 days

## 2018-04-30 NOTE — Progress Notes (Signed)
Physical Therapy Treatment Patient Details Name: [name] MRN: [id number] DOB: 02-10-1927 Today's Date: 04/30/2018    History of Present Illness [name]  is a 84 y.o. male, w h/o Dementia,  dm2, CKD stage 4, Gerd, Barrett's esophagus, anemia, apparently presents with altered mental status , found to be hypoglycemic,  Pt arrived in ED after glucagon with bs 78.  Pt febrile in ED.     PT Comments    Pt lethergic through session with constant tactile and verbal cueing.  Max A with bed mobility rolling for pressure relief all directions.  Passive heel slides complete to address mobility, pt did express "ow" when completing Lt LE heel slides.  Max A for seated balance, presents with kyphotic posture and difficulty without HHA due to weakness.  EOS pt left in bed iwht call bell within reach.  Did express some "brrr" though session, given additional blanket for comfort.    Follow Up Recommendations        Equipment Recommendations  None recommended by PT    Recommendations for Other Services       Precautions / Restrictions Precautions Precautions: Fall Precaution Comments: lethargic today    Mobility  Bed Mobility Overal bed mobility: Needs Assistance Bed Mobility: Rolling Rolling: Max assist;Total assist   Supine to sit: Max assist Sit to supine: Max assist   General bed mobility comments: pt was weak to assist and dependent to scoot up bed  Transfers                    Ambulation/Gait                 Stairs             Wheelchair Mobility    Modified Rankin (Stroke Patients Only)       Balance                                            Cognition Arousal/Alertness: Lethargic Behavior During Therapy: Flat affect Overall Cognitive Status: History of cognitive impairments - at baseline                                        Exercises      General Comments        Pertinent Vitals/Pain  Pain Assessment: Faces Faces Pain Scale: Hurts a little bit Pain Location: Pt expressed "ow" and "brrr" with passive heel slides Lt LE, when asked where unable to pinpoint Pain Descriptors / Indicators: Grimacing;Discomfort;Moaning Pain Intervention(s): Limited activity within patient's tolerance;Monitored during session;Repositioned    Home Living                      Prior Function            PT Goals (current goals can now be found in the care plan section)      Frequency    Min 3X/week      PT Plan Current plan remains appropriate    Co-evaluation              AM-PAC PT "6 Clicks" Daily Activity  Outcome Measure  Difficulty turning over in bed (including adjusting bedclothes, sheets and blankets)?: Unable Difficulty moving from lying on back to sitting on  the side of the bed? : Unable Difficulty sitting down on and standing up from a chair with arms (e.g., wheelchair, bedside commode, etc,.)?: Unable Help needed moving to and from a bed to chair (including a wheelchair)?: A Lot Help needed walking in hospital room?: Total Help needed climbing 3-5 steps with a railing? : Total 6 Click Score: 7    End of Session   Activity Tolerance: Patient limited by fatigue;Patient limited by pain Patient left: in bed;with call bell/phone within reach;with bed alarm set Nurse Communication: Mobility status PT Visit Diagnosis: Unsteadiness on feet (R26.81);Other abnormalities of gait and mobility (R26.89);Muscle weakness (generalized) (M62.81)     Time: 7035-0093 PT Time Calculation (min) (ACUTE ONLY): 22 min  Charges:  $Therapeutic Activity: 8-22 mins                     [address] LPTA; CBIS [phone]   Aldona Lento 04/30/2018, 4:29 PM

## 2018-04-30 NOTE — Consult Note (Addendum)
Consultation Note Date: 04/30/2018   Patient Name: [name]  DOB: Feb 22, 1927  MRN: [id number]  Age / Sex: 84 y.o., male  PCP: Lemmie Evens, MD Referring Physician: Orson Eva, MD  Reason for Consultation: Establishing goals of care  HPI/Patient Profile: 84 y.o. male  with past medical history of dementia, Barrett's esophagus, CKD stage 4, anemia, DM2 admitted on 04/22/2018 with unresponsive at home and EMS called and found to hypoglycemic and admitted with sepsis, AMS, worsening renal function. Has had little improvement over admission and with overall adult failure to thrive.   Clinical Assessment and Goals of Care: I met today at Mr. Mohammed's bedside. He is lethargic but arouses. He is pleasantly confused. He does not answer any of my questions but with a garbled word I cannot understand. He is hard of very hard of hearing per RN. He does stick out his tongue when asked (but only after I stick out my tongue and he follows in response) but not to verbal command. He does tell me "I'm cold" and when I cover him up with his blankets he responds "thank you ma'am." His breakfast is at bedside and he ate a little ~25-30% of meal but did drink his fluids.   I had just missed his STEP daughter, Kendrick Fries [phone], who is just leaving to return to her home in [place]. We were able to have a good conversation over the phone. She is unable to be here as she is caring for her mother who is in poor health in [place]. We discussed her father's decline since his femur fracture and surgical repair in July with significant weight loss and declining functional status. He falls often (no handrails and "slides down the walls"), is mainly bed bound, and incontinent. Kendrick Fries shares that he lives with her brother, Major, and Major's girlfriend, Lynelle Smoke, and they are present 24/7. She does have significant concerns regarding the home  environment and would prefer for him to go to a facility. She understands that her father is declining and that his QOL is declining. Kendrick Fries is clear that her main concern is that she wants her father to not suffer and have the care he needs to keep him happy and safe. Kendrick Fries supports DNR status and says that her brother has been resistant but appears to be coming around to agree with DNR. We also discussed that if he is to return home how hospice can assist with safety, care, and comfort at home. Kendrick Fries is open to hospice as well. With Medicaid and Medicare he could also potentially have hospice care covered by Medicare and facility covered by Medicaid as well.   I spoke today with son, Major, and his girlfriend, Tammy. Major speaks, extensively, about his father's decline after his surgery in July and he attributes this to poor care at [facility] rehab. Major is fixated on this and that his father should not be in the poor health that he is in if this had been different. I spent much time expressing my concern and  regret that they had this experience. I tried to encourage them to focus on Mr. Rumpf current situation and what is best for him now. We spent much time discussing DNR. Major ultimately agrees to DNR with support from [place]. Major is worried that this means that himself and [place] the medical team is giving up on his father. I explained to him that DNR does not at all change the level of care and that we are doing everything we can to provide his father good care and try to fix and treat all we can to try and have some improvement. I explained that the only reason we are discussing DNR is because we want what is best for his father and would hate to put him through resuscitation as we do not feel this would be beneficial to him but would cause him pain and suffering. Emotional support provided.   Update from Dr. Carles Collet that Kendrick Fries is a step-daughter and NOT a Media planner. She used to have HCPOA but gave this  up. Also Mr. Fitzpatrick has used all his Medicaid rehab days currently. Major agrees with DNR. I did not introduce hospice today given Major's fears that we are giving up. Will need to establish some trust before this is introduced.   Primary Decision Maker NEXT OF KIN son Major (significant other Tammy assists him)    SUMMARY OF RECOMMENDATIONS   - DNR ultimately decided best for Mr. Waren - Son struggling with his father's decline  Code Status/Advance Care Planning:  DNR   Symptom Management:   Fluctuating intake: Adding Glucerna TID. Encourage intake at mealtime with careful hand feeding.   Weakness: PT following. I would be surprised if family agreed to SNF placement but CSW to further discuss. Unfortunately we did not get that far in our conversation and I also did not discuss hospice options.   Palliative Prophylaxis:   Aspiration, Bowel Regimen, Delirium Protocol, Oral Care and Turn Reposition  Additional Recommendations (Limitations, Scope, Preferences):  Full Scope Treatment  Psycho-social/Spiritual:   Desire for further Chaplaincy support:yes  Additional Recommendations: Caregiving  Support/Resources, Education on Hospice and Grief/Bereavement Support  Prognosis:   Prognosis very poor. Would be eligible for hospice facility IF family elected full comfort care. With aggressive care prognosis likely only weeks to months.   Discharge Planning: To Be Determined      Primary Diagnoses: Present on Admission: . Sepsis ([place]) . Acute renal failure ([place]) . Anemia . Dementia ([place]) . HTN (hypertension)   I have reviewed the medical record, interviewed the patient and family, and examined the patient. The following aspects are pertinent.  Past Medical History:  Diagnosis Date  . Anemia   . Arthritis   . Barrett's esophagus 05/14/2005   EGD Dr Sharlett Iles, gastritis  . Chewing tobacco nicotine dependence without complication   . Colon polyps 05/14/2005   Dr  Vernetta Honey path avail at this time  . Dementia ([place]) 04/19/2011  . Diabetes mellitus   . Renal insufficiency   . S/P colonoscopy 05/14/2005   colonic avm cauterized  . Shortness of breath    Social History   Socioeconomic History  . Marital status: Single    Spouse name: Not on file  . Number of children: 3  . Years of education: Not on file  . Highest education level: Not on file  Occupational History  . Occupation: retired  Scientific laboratory technician  . Financial resource strain: Not on file  . Food insecurity:    Worry: Not  on file    Inability: Not on file  . Transportation needs:    Medical: Not on file    Non-medical: Not on file  Tobacco Use  . Smoking status: Never Smoker  . Smokeless tobacco: Never Used  Substance and Sexual Activity  . Alcohol use: No  . Drug use: No  . Sexual activity: Never  Lifestyle  . Physical activity:    Days per week: Not on file    Minutes per session: Not on file  . Stress: Not on file  Relationships  . Social connections:    Talks on phone: Not on file    Gets together: Not on file    Attends religious service: Not on file    Active member of club or organization: Not on file    Attends meetings of clubs or organizations: Not on file    Relationship status: Not on file  Other Topics Concern  . Not on file  Social History Narrative  . Not on file   Family History  Problem Relation Age of Onset  . Colon cancer Neg Hx    Scheduled Meds: . amLODipine  5 mg Oral Daily  . aspirin  325 mg Oral Q breakfast  . carvedilol  6.25 mg Oral BID WC  . cephALEXin  250 mg Oral Q8H  . ferrous sulfate  325 mg Oral BID WC  . folic acid  1 mg Oral Daily  . insulin aspart  0-9 Units Subcutaneous Q4H  . metolazone  5 mg Oral Daily  . pantoprazole  40 mg Oral BID  . polyethylene glycol  17 g Oral Daily  . senna-docusate  1 tablet Oral BID  . sodium bicarbonate  650 mg Oral TID  . tamsulosin  0.4 mg Oral Daily  . torsemide  60 mg Oral Daily    Continuous Infusions: PRN Meds:.ipratropium-albuterol, prochlorperazine No Known Allergies Review of Systems  Unable to perform ROS: Acuity of condition    Physical Exam  Constitutional: He appears well-developed. He appears lethargic. He appears ill.  Frail, elderly  HENT:  Head: Normocephalic and atraumatic.  Cardiovascular: Normal rate.  Pulmonary/Chest: Effort normal. No accessory muscle usage. No tachypnea. No respiratory distress. He has rales in the right lower field and the left lower field.  Abdominal: Soft. Normal appearance.  Neurological: He appears lethargic. He is disoriented.  Nursing note and vitals reviewed.   Vital Signs: BP (!) 129/50 (BP Location: Left Arm)   Pulse 78   Temp 99.1 F (37.3 C) (Oral)   Resp 20   Ht 5' 8"  (1.727 m)   Wt 75.1 kg   SpO2 95%   BMI 25.17 kg/m  Pain Scale: 0-10 POSS *See Group Information*: S-Acceptable,Sleep, easy to arouse Pain Score: 0-No pain   SpO2: SpO2: 95 % O2 Device:SpO2: 95 % O2 Flow Rate: .O2 Flow Rate (L/min): 2 L/min  IO: Intake/output summary:   Intake/Output Summary (Last 24 hours) at 04/30/2018 1038 Last data filed at 04/30/2018 0900 Gross per 24 hour  Intake 720 ml  Output 3250 ml  Net -2530 ml    LBM: Last BM Date: 04/27/18 Baseline Weight: Weight: 69.9 kg Most recent weight: Weight: 75.1 kg     Palliative Assessment/Data: 30%     Time In/Out: 1000-1030, 1240-1340 Time Total: 90 min Greater than 50%  of this time was spent counseling and coordinating care related to the above assessment and plan.  Signed by: Vinie Sill, NP Palliative Medicine Team Pager # [phone] (  M-F 8a-5p) Team Phone # [phone] (Nights/Weekends)

## 2018-04-30 NOTE — Clinical Social Work Note (Addendum)
LCSW following. Pt has a bed offer at [facility] and they are working on Ship broker. Family and the facility need to discuss payment for any days beyond the first six as pt will not have full payment after that. Updated Tammy who answers the phone and speaks on behalf of pt's son, Major. Per Tammy, she will go over to [place] today and speak with the business office manager there to sort this out.   Discussed with Dr. Carles Collet who states Palliative Care is consulting and pt may be appropriate for palliative or hospice care at dc.   Will follow up in AM. Per MD, pt may be stable for dc tomorrow.

## 2018-04-30 NOTE — Care Management Important Message (Signed)
Important Message  Patient Details  Name: [name] MRN: [id number] Date of Birth: [birth year]   Medicare Important Message Given:  Yes    [name] 04/30/2018, 12:30 PM

## 2018-05-01 ENCOUNTER — Inpatient Hospital Stay (HOSPITAL_COMMUNITY): Payer: Medicare Other

## 2018-05-01 ENCOUNTER — Telehealth: Payer: Self-pay

## 2018-05-01 ENCOUNTER — Encounter (HOSPITAL_COMMUNITY): Payer: Self-pay | Admitting: Internal Medicine

## 2018-05-01 DIAGNOSIS — I499 Cardiac arrhythmia, unspecified: Secondary | ICD-10-CM

## 2018-05-01 DIAGNOSIS — I639 Cerebral infarction, unspecified: Secondary | ICD-10-CM

## 2018-05-01 DIAGNOSIS — R627 Adult failure to thrive: Secondary | ICD-10-CM

## 2018-05-01 DIAGNOSIS — G9341 Metabolic encephalopathy: Secondary | ICD-10-CM

## 2018-05-01 DIAGNOSIS — Z9889 Other specified postprocedural states: Secondary | ICD-10-CM

## 2018-05-01 LAB — CBC
HEMATOCRIT: 26.4 % — AB (ref 39.0–52.0)
HEMOGLOBIN: 7.9 g/dL — AB (ref 13.0–17.0)
MCH: 29.9 pg (ref 26.0–34.0)
MCHC: 29.9 g/dL — AB (ref 30.0–36.0)
MCV: 100 fL (ref 80.0–100.0)
NRBC: 0 % (ref 0.0–0.2)
Platelets: 222 10*3/uL (ref 150–400)
RBC: 2.64 MIL/uL — ABNORMAL LOW (ref 4.22–5.81)
RDW: 15.6 % — AB (ref 11.5–15.5)
WBC: 7.3 10*3/uL (ref 4.0–10.5)

## 2018-05-01 LAB — GLUCOSE, CAPILLARY
GLUCOSE-CAPILLARY: 100 mg/dL — AB (ref 70–99)
GLUCOSE-CAPILLARY: 128 mg/dL — AB (ref 70–99)
Glucose-Capillary: 113 mg/dL — ABNORMAL HIGH (ref 70–99)
Glucose-Capillary: 115 mg/dL — ABNORMAL HIGH (ref 70–99)
Glucose-Capillary: 176 mg/dL — ABNORMAL HIGH (ref 70–99)
Glucose-Capillary: 97 mg/dL (ref 70–99)

## 2018-05-01 LAB — BODY FLUID CELL COUNT WITH DIFFERENTIAL
EOS FL: 0 %
LYMPHS FL: 61 %
MONOCYTE-MACROPHAGE-SEROUS FLUID: 34 % — AB (ref 50–90)
NEUTROPHIL FLUID: 5 % (ref 0–25)
OTHER CELLS FL: 1 %
Total Nucleated Cell Count, Fluid: 329 cu mm (ref 0–1000)

## 2018-05-01 LAB — GRAM STAIN: Gram Stain: NONE SEEN

## 2018-05-01 LAB — RENAL FUNCTION PANEL
ALBUMIN: 2.2 g/dL — AB (ref 3.5–5.0)
Anion gap: 9 (ref 5–15)
BUN: 88 mg/dL — AB (ref 8–23)
CO2: 21 mmol/L — ABNORMAL LOW (ref 22–32)
Calcium: 8.2 mg/dL — ABNORMAL LOW (ref 8.9–10.3)
Chloride: 109 mmol/L (ref 98–111)
Creatinine, Ser: 3.32 mg/dL — ABNORMAL HIGH (ref 0.61–1.24)
GFR calc non Af Amer: 15 mL/min — ABNORMAL LOW (ref >60)
GFR, EST AFRICAN AMERICAN: 18 mL/min — AB (ref >60)
Glucose, Bld: 110 mg/dL — ABNORMAL HIGH (ref 70–99)
PHOSPHORUS: 4.9 mg/dL — AB (ref 2.5–4.6)
Potassium: 4.7 mmol/L (ref 3.5–5.1)
Sodium: 139 mmol/L (ref 135–145)

## 2018-05-01 LAB — URINE CULTURE: Culture: NO GROWTH

## 2018-05-01 LAB — LACTATE DEHYDROGENASE, PLEURAL OR PERITONEAL FLUID: LD FL: 134 U/L — AB (ref 3–23)

## 2018-05-01 LAB — LIPID PANEL
Cholesterol: 93 mg/dL (ref 0–200)
HDL: 25 mg/dL — ABNORMAL LOW (ref >40)
LDL CALC: 55 mg/dL (ref 0–99)
TRIGLYCERIDES: 67 mg/dL (ref <150)
Total CHOL/HDL Ratio: 3.7 RATIO
VLDL: 13 mg/dL (ref 0–40)

## 2018-05-01 LAB — PROTEIN, PLEURAL OR PERITONEAL FLUID

## 2018-05-01 LAB — HEMOGLOBIN A1C
Hgb A1c MFr Bld: 5.1 % (ref 4.8–5.6)
MEAN PLASMA GLUCOSE: 99.67 mg/dL

## 2018-05-01 MED ORDER — DARBEPOETIN ALFA 60 MCG/0.3ML IJ SOSY
60.0000 ug | PREFILLED_SYRINGE | Freq: Once | INTRAMUSCULAR | Status: AC
  Administered 2018-05-01: 60 ug via SUBCUTANEOUS
  Filled 2018-05-01: qty 0.3

## 2018-05-01 NOTE — Care Management Note (Signed)
Case Management Note  Patient Details  Name: [name] MRN: [id number] Date of Birth: 02-Apr-1927  If discussed at Long Length of Stay Meetings, dates discussed:  05/01/18  Additional Comments:  Sherald Barge, RN 05/01/2018, 4:17 PM

## 2018-05-01 NOTE — Procedures (Signed)
PreOperative Dx: LEFT pleural effusion Postoperative Dx: LEFT pleural effusion Procedure:   US guided LEFT thoracentesis Radiologist:  Thornton Papas Anesthesia:  10 ml of 1% lidocaine Specimen:  700 mL of clear yellow colored fluid EBL:   < 1 ml Complications: None

## 2018-05-01 NOTE — Care Management (Signed)
Pt active with [facility] pta. CM following peripherally for DC planning needs.

## 2018-05-01 NOTE — Clinical Social Work Note (Signed)
LCSW following. Reviewed Palliative NP note and pt discussed with MD in progression today. Pt not yet medically stable for dc. Attempted to reach pt's son or his girlfriend by phone today. Could only leave a voicemail message requesting return call. Continuing to work on Ship broker for SNF rehab at [facility] if this is what the family wants. Awaiting further Palliative Care discussion and family decision.  Will follow.

## 2018-05-01 NOTE — Progress Notes (Signed)
Subjective: Interval History: Patient offers no complaints.  Objective: Vital signs in last 24 hours: Temp:  [97.8 F (36.6 C)-99.6 F (37.6 C)] 97.8 F (36.6 C) (11/07 0506) Pulse Rate:  [60-76] 72 (11/07 0506) Resp:  [18-20] 18 (11/07 0506) BP: (121-126)/(46-56) 126/56 (11/07 0506) SpO2:  [94 %-96 %] 94 % (11/07 0506) Weight:  [76.1 kg] 76.1 kg (11/07 0734) Weight change:   Intake/Output from previous day: 11/06 0701 - 11/07 0700 In: 600 [P.O.:600] Out: 1350 [Urine:1350] Intake/Output this shift: Total I/O In: -  Out: 2000 [Urine:2000]  Generally patient is alert.  He does not seem to be in any apparent distress. Chest: He has some inspiratory crackles Heart exam revealed regular rate and rhythm Extremities he has trace edema  Lab Results: Recent Labs    05/01/18 0445  WBC 7.3  HGB 7.9*  HCT 26.4*  PLT 222   BMET:  Recent Labs    04/30/18 0458 05/01/18 0445  NA 139 139  K 4.7 4.7  CL 111 109  CO2 17* 21*  GLUCOSE 82 110*  BUN 81* 88*  CREATININE 3.32* 3.32*  CALCIUM 7.9* 8.2*   No results for input(s): PTH in the last 72 hours. Iron Studies: No results for input(s): IRON, TIBC, TRANSFERRIN, FERRITIN in the last 72 hours.  Studies/Results: Mr Brain Wo Contrast  Result Date: 04/30/2018 CLINICAL DATA:  84 y/o  M; altered mental status. EXAM: MRI HEAD WITHOUT CONTRAST TECHNIQUE: Multiplanar, multiecho pulse sequences of the brain and surrounding structures were obtained without intravenous contrast. COMPARISON:  04/23/2018 CT head.  04/20/2011 MRI head. FINDINGS: Brain: Severely motion degraded study. Chronic infarct within the right lateral frontal lobe. Reduced diffusion at the posterior margin of the right lateral frontal chronic infarction compatible with new or recrudescence acute/early subacute infarction. Additionally, there is a punctate focus of reduced diffusion in the right occipital lobe compatible with a small vessel acute/early subacute  infarction. No associated mass effect or gross hemorrhage. No herniation. Vascular: Normal flow voids. Skull and upper cervical spine: Normal marrow signal. Sinuses/Orbits: Negative. Other: None. IMPRESSION: 1. Severe motion artifact. 2. Chronic right lateral frontal lobe infarction. Reduced diffusion at the posterior margin of the infarction is compatible with new or recrudescence acute/early subacute infarction. Additional punctate acute/early subacute infarction in right occipital lobe. No associated hemorrhage or mass effect identified. These results will be called to the ordering clinician or representative by the Radiologist Assistant, and communication documented in the PACS or zVision Dashboard. Electronically Signed   By: Kristine Garbe M.D.   On: 04/30/2018 18:37   Dg Chest Port 1 View  Result Date: 04/30/2018 CLINICAL DATA:  Chronic bilateral pleural effusions. EXAM: PORTABLE CHEST 1 VIEW COMPARISON:  CT 04/23/2018.  Chest x-ray 04/22/2018. FINDINGS: Mediastinum hilar structures normal. Cardiomegaly with diffuse bilateral from interstitial prominence and bilateral pleural effusions consistent with CHF. Underlying lingular/left base lesions noted on recent CT may be obscured by pulmonary edema and effusion. Continued follow-up exams again suggested as noted on prior CT of 04/23/2018. Severe degenerative changes both shoulders. IMPRESSION: Congestive heart failure with bilateral pulmonary interstitial edema and bilateral pleural effusions, particularly prominent on the left. 2. Lingular/left base lesions cyst noted on recent CT may be obscured by pulmonary edema and effusion. Continued follow-up exams suggested as noted on prior CT of 04/23/2018. Electronically Signed   By: Marcello Moores  Register   On: 04/30/2018 12:51    I have reviewed the patient's current medications.  Assessment/Plan: 1] possibly acute kidney injury superimposed  on chronic.  His renal function remains a stable.  Patient  denies any nausea or vomiting.  His appetite seems to be good. 2] fluid management: Patient on Demadex and metolazone orally..  He has 2000 cc of urine output.  He does not have any significant sign of fluid overload. 3] low CO2: Possibly metabolic acidosis.  Patient is started on sodium bicarbonate.  His CO2 is 21 and has improved. 4] bone and mineral disorder: His calcium and phosphorus is range 5] hypertension: His blood pressure is reasonably controlled 6] history of diabetes : His blood sugar is reasonably controlled 7] anemia: Anemia secondary to chronic renal failure.  Patient is on Aranesp as an outpatient.  His hemoglobin is declining Plan: 1] we will continue with metolazone and Demadex 2] we will check his renal panel and CBC in the morning. 3] We will give him Aranesp 60 ug sq one dose   LOS: 8 days   Remmy Crass S 05/01/2018,10:27 AM

## 2018-05-01 NOTE — Progress Notes (Addendum)
PROGRESS NOTE  Alarik Radu Pyle FTD:[id number] DOB: 1927-02-19 DOA: 04/22/2018 PCP: Lemmie Evens, MD  Brief History:  84 y/o male with history of CKD 4, DM2, and dementia presented on 10/29 when he was found unresponsive.  Initial CBG was read "low".  The patient was given oral glucagon. When he arrived in the ED his CBG was 78. When I asked the patient how he is doing he states "I feel fine". However he is unable to answer any kind of detailed questions such as where he is located in what has been going on.  The patient fractured his femur in July 2019.  It was surgically repaired by Dr. Ninfa Linden on 12/28/2017, and the patient went to skilled nursing facility on 01/03/2018.  Since that period of time, the patient has had a functional decline to the point where he is essentially bedbound at this point.  On the evening prior to admission, the patient was noted to be confused and has had decreased oral intake.  Since admission, the patient was treated for sepsis secondary to aspiration pneumonitis.  He was noted to have acute on chronic renal failure.  Nephrology was consulted to assist with management.  The patient was noted to have fluid overload and was started on intravenous furosemide.  Assessment/Plan: Sepsis -Present at the time of admission -Secondary to aspiration pneumonia and bacteremia -Patient finished 5 days of antibiotics -Sepsis physiology resolved  Acute metabolic encephalopathy -Multifactorial including infectious process, dehydration, progression of renal failure, stroke and hypoglycemia -Patient is alert oriented x2 -TSH 0.662 -B12--1647 -check CWCBJSE--83 -check folic acid--pending -MR brain--early subacute R-frontal stroke; punctate early subacute stroke R-occipital -UA--neg for pyruia  Staphylococcus shleiferi Bacteremia -initially on ceftriaxone-->cephalexin x 7 days  Anasarca/pleural effusions -due to progression of CKD and hypoalbuminemia -continue  torsemide 60 mg daily and metolazone -repeat CXR--personally rleviewed--pulmonary edema and L>R pleural effusion -04/23/2018 echo EF 60-65%, no WMA, small pericardial effusion, mild TR, PASP 34 -Check urine protein/creatinine ratio--0.53 -11/7--thoracocentesis--700 cc removed  Acute on chronic renal failure--CKD stage 4 -previous baseline 2.3-2.6 -now has new renal baseline 3.1-3.4 -am BMP -continue torsemide per renal  Acute/Subacute ischemic stroke -MRI brain--as above -MRA brain--advanced R- M2 segment stenosis and R-P2 stenosis -carotid US--Right--50-69% stenosis, L--no hemodynamically significant stenosis -LDL 55 -A1C--pending -PT-->SNF, family refuses -speech eval-->dyshagia 2 diet -continue ASA -11/7--case discussed with neurology--Dr. Arora-->pt is not a candidate for aggressive/invasive intervention-->continue ASA--set up 30 day event monitor after d/c  Diabetes mellitus type 2 with nephropathy -12/30/2017 hemoglobin A 1 C--5.5 -Patient has had intermittent hypoglycemia secondary to poor oral intake -Discontinue NovoLog sliding scale -Continue intermittent CBG checks  Essential hypertension -Continue amlodipine, carvedilol  Dementia without behavioral disturbance -Continue supportive care  Anemia of CKD -Baseline hemoglobin 8-9 -Patient was transfused 2 units PRBC during this admission -Iron saturation 4%, ferritin 838 -Serum T51 7616 -Folic acid 07.3  Metabolic acidosis -Secondary to CKD stage IV -Continue bicarbonate per nephrology  Goals of Care -palliative medicine consulted--case discussed with Vinie Sill -pt is no DNR     Disposition Plan:   SNF 11/9 if stable Family Communication:   step-Daughter updated 05/01/18--Total time spent 35 minutes.  Greater than 50% spent face to face counseling and coordinating care.   Consultants:  Palliative medicine  Code Status:   DNR  DVT Prophylaxis:  SCDs   Procedures: As Listed in  Progress Note Above  Antibiotics: Cephalexin 04/28/2018>>> Ceftriaxone 04/26/2018>>> 04/28/2018 Augmentin 04/24/2018>>> 04/26/2018 Cefepime 04/23/2018 x1  Subjective: Pt is more alert, pleasantly confused.  Denies cp, sob.  No vomiting, diarrhea, resp distress reported.  Remainder ROS not obtainable.  Objective: Vitals:   05/01/18 0506 05/01/18 0734 05/01/18 1220 05/01/18 1246  BP: (!) 126/56  (!) 120/53 (!) 113/56  Pulse: 72  76 74  Resp: 18  20 20   Temp: 97.8 F (36.6 C)     TempSrc: Oral     SpO2: 94%  95% 95%  Weight:  76.1 kg    Height:        Intake/Output Summary (Last 24 hours) at 05/01/2018 1401 Last data filed at 05/01/2018 0930 Gross per 24 hour  Intake 480 ml  Output 3350 ml  Net -2870 ml   Weight change:  Exam:   General:  Pt is alert, follows commands appropriately, not in acute distress  HEENT: No icterus, No thrush, No neck mass, [place]/AT  Cardiovascular: RRR, S1/S2, no rubs, no gallops  Respiratory: bibasilar crackles, no wheeze  Abdomen: Soft/+BS, non tender, non distended, no guarding  Extremities: No edema, No lymphangitis, No petechiae, No rashes, no synovitis   Data Reviewed: I have personally reviewed following labs and imaging studies Basic Metabolic Panel: Recent Labs  Lab 04/26/18 1844 04/27/18 0637 04/28/18 0539 04/29/18 0434 04/30/18 0458 05/01/18 0445  NA  --  139 138 139  140 139 139  K  --  4.7 4.8 4.7  4.6 4.7 4.7  CL  --  117* 115* 114*  114* 111 109  CO2  --  15* 16* 16*  17* 17* 21*  GLUCOSE  --  105* 120* 108*  109* 82 110*  BUN  --  67* 67* 77*  78* 81* 88*  CREATININE  --  3.20* 3.38*  3.28* 3.38*  3.39* 3.32* 3.32*  CALCIUM  --  8.0* 8.0* 8.2*  8.0* 7.9* 8.2*  MG 1.6*  --   --   --   --   --   PHOS  --  4.3 5.0* 5.1*  5.2* 4.8* 4.9*   Liver Function Tests: Recent Labs  Lab 04/27/18 0637 04/28/18 0539 04/29/18 0434 04/30/18 0458 05/01/18 0445  ALBUMIN 2.2* 2.2* 2.1*  2.2* 2.1* 2.2*   No  results for input(s): LIPASE, AMYLASE in the last 168 hours. Recent Labs  Lab 04/30/18 1409  AMMONIA 22   Coagulation Profile: No results for input(s): INR, PROTIME in the last 168 hours. CBC: Recent Labs  Lab 04/25/18 0410 04/27/18 0637 05/01/18 0445  WBC 5.6 8.5 7.3  HGB 8.5* 9.2* 7.9*  HCT 28.8* 31.0* 26.4*  MCV 97.0 96.9 100.0  PLT 170 170 222   Cardiac Enzymes: Recent Labs  Lab 04/30/18 1409  CKTOTAL 58   BNP: Invalid input(s): POCBNP CBG: Recent Labs  Lab 04/30/18 2034 05/01/18 0016 05/01/18 0505 05/01/18 0756 05/01/18 1122  GLUCAP 142* 115* 100* 97 128*   HbA1C: No results for input(s): HGBA1C in the last 72 hours. Urine analysis:    Component Value Date/Time   COLORURINE STRAW (A) 04/30/2018 1132   APPEARANCEUR CLEAR 04/30/2018 1132   LABSPEC 1.015 04/30/2018 1132   PHURINE 5.5 04/30/2018 1132   GLUCOSEU NEGATIVE 04/30/2018 1132   HGBUR NEGATIVE 04/30/2018 1132   BILIRUBINUR NEGATIVE 04/30/2018 1132   KETONESUR NEGATIVE 04/30/2018 1132   PROTEINUR NEGATIVE 04/30/2018 1132   UROBILINOGEN 0.2 11/28/2012 1626   NITRITE NEGATIVE 04/30/2018 1132   LEUKOCYTESUR NEGATIVE 04/30/2018 1132   Sepsis Labs: @LABRCNTIP (procalcitonin:4,lacticidven:4) ) Recent Results (from the past 240 hour(s))  Urine culture     Status: None   Collection Time: 04/22/18 11:33 PM  Result Value Ref Range Status   Specimen Description   Final    URINE, CLEAN CATCH Performed at [facility], [address], [place], [place] [address]    Special Requests   Final    Normal Performed at [facility], [address] [place], [place] [address]    Culture   Final    NO GROWTH Performed at [facility], [place] [address], [place], [place] [address]    Report Status 04/24/2018 FINAL  Final  Blood Culture (routine x 2)     Status: Abnormal   Collection Time: 04/22/18 11:51 PM  Result Value Ref Range Status   Specimen Description   Final    BLOOD LEFT FOREARM Performed at  [facility], [address] [place], [place] [address]    Special Requests   Final    BOTTLES DRAWN AEROBIC AND ANAEROBIC Blood Culture adequate volume Performed at [facility], [address] [place], [place] [address]    Culture  Setup Time   Final    GRAM POSITIVE COCCI Gram Stain Report Called to,Read Back By and Verified With: SMITH,J. AT 1657 ON10/30/2019 BY EVA IN BOTH AEROBIC AND ANAEROBIC BOTTLES Performed at [facility], [place] [address], [place], [place] [address]    Culture STAPHYLOCOCCUS SCHLEIFERI (A)  Final   Report Status 04/27/2018 FINAL  Final   Organism ID, Bacteria STAPHYLOCOCCUS SCHLEIFERI  Final      Susceptibility   Staphylococcus schleiferi - MIC*    CIPROFLOXACIN <=0.5 SENSITIVE Sensitive     ERYTHROMYCIN <=0.25 SENSITIVE Sensitive     GENTAMICIN <=0.5 SENSITIVE Sensitive     OXACILLIN <=0.25 SENSITIVE Sensitive     TETRACYCLINE <=1 SENSITIVE Sensitive     VANCOMYCIN 1 SENSITIVE Sensitive     TRIMETH/SULFA <=10 SENSITIVE Sensitive     CLINDAMYCIN <=0.25 SENSITIVE Sensitive     RIFAMPIN <=0.5 SENSITIVE Sensitive     Inducible Clindamycin NEGATIVE Sensitive     * STAPHYLOCOCCUS SCHLEIFERI  Blood Culture (routine x 2)     Status: Abnormal   Collection Time: 04/22/18 11:52 PM  Result Value Ref Range Status   Specimen Description   Final    BLOOD RIGHT HAND Performed at [facility], [address], [place], [place] [address]    Special Requests   Final    BOTTLES DRAWN AEROBIC AND ANAEROBIC Blood Culture adequate volume Performed at [facility], [address] [place], [place] [address]    Culture  Setup Time   Final    GRAM POSITIVE COCCI Gram Stain Report Called to,Read Back By and Verified With: SMITH,J. AT 3785 ON 04/23/2018 BY EVA AEROBIC BOTTLE ONLY Performed at [place] BY AND VERIFIED WITHMiquel Dunn RN [id number] 04/24/18 A BROWNING Performed at [facility], [place] [address], [place],  [place] [address]    Culture STAPHYLOCOCCUS SCHLEIFERI (A)  Final   Report Status 04/26/2018 FINAL  Final   Organism ID, Bacteria STAPHYLOCOCCUS SCHLEIFERI  Final      Susceptibility   Staphylococcus schleiferi - MIC*    CIPROFLOXACIN <=0.5 SENSITIVE Sensitive     ERYTHROMYCIN <=0.25 SENSITIVE Sensitive     GENTAMICIN <=0.5 SENSITIVE Sensitive     OXACILLIN <=0.25 SENSITIVE Sensitive     TETRACYCLINE <=1 SENSITIVE Sensitive     VANCOMYCIN <=0.5 SENSITIVE Sensitive     TRIMETH/SULFA <=  10 SENSITIVE Sensitive     CLINDAMYCIN <=0.25 SENSITIVE Sensitive     RIFAMPIN <=0.5 SENSITIVE Sensitive     Inducible Clindamycin NEGATIVE Sensitive     * STAPHYLOCOCCUS SCHLEIFERI  Blood Culture ID Panel (Reflexed)     Status: None   Collection Time: 04/22/18 11:52 PM  Result Value Ref Range Status   Enterococcus species NOT DETECTED NOT DETECTED Final   Listeria monocytogenes NOT DETECTED NOT DETECTED Final   Staphylococcus species NOT DETECTED NOT DETECTED Final   Staphylococcus aureus (BCID) NOT DETECTED NOT DETECTED Final   Streptococcus species NOT DETECTED NOT DETECTED Final   Streptococcus agalactiae NOT DETECTED NOT DETECTED Final   Streptococcus pneumoniae NOT DETECTED NOT DETECTED Final   Streptococcus pyogenes NOT DETECTED NOT DETECTED Final   Acinetobacter baumannii NOT DETECTED NOT DETECTED Final   Enterobacteriaceae species NOT DETECTED NOT DETECTED Final   Enterobacter cloacae complex NOT DETECTED NOT DETECTED Final   Escherichia coli NOT DETECTED NOT DETECTED Final   Klebsiella oxytoca NOT DETECTED NOT DETECTED Final   Klebsiella pneumoniae NOT DETECTED NOT DETECTED Final   Proteus species NOT DETECTED NOT DETECTED Final   Serratia marcescens NOT DETECTED NOT DETECTED Final   Haemophilus influenzae NOT DETECTED NOT DETECTED Final   Neisseria meningitidis NOT DETECTED NOT DETECTED Final   Pseudomonas aeruginosa NOT DETECTED NOT DETECTED Final   Candida albicans NOT DETECTED NOT DETECTED  Final   Candida glabrata NOT DETECTED NOT DETECTED Final   Candida krusei NOT DETECTED NOT DETECTED Final   Candida parapsilosis NOT DETECTED NOT DETECTED Final   Candida tropicalis NOT DETECTED NOT DETECTED Final    Comment: Performed at [facility], [place]. [address], [place], [place] [address]  MRSA PCR Screening     Status: None   Collection Time: 04/23/18  5:50 PM  Result Value Ref Range Status   MRSA by PCR NEGATIVE NEGATIVE Final    Comment:        The GeneXpert MRSA Assay (FDA approved for NASAL specimens only), is one component of a comprehensive MRSA colonization surveillance program. It is not intended to diagnose MRSA infection nor to guide or monitor treatment for MRSA infections. Performed at [facility], [address], [place], [place] [address]   Culture, Urine     Status: None   Collection Time: 04/30/18 11:32 AM  Result Value Ref Range Status   Specimen Description   Final    URINE, CLEAN CATCH Performed at [facility], [address], [place], [place] [address]    Special Requests   Final    NONE Performed at [facility], [address], [place], [place] [address]    Culture   Final    NO GROWTH Performed at [facility], [place] [address] [place], [place] [address]    Report Status 05/01/2018 FINAL  Final  Gram stain     Status: None   Collection Time: 05/01/18 12:54 PM  Result Value Ref Range Status   Specimen Description PLEURAL  Final   Special Requests NONE  Final   Gram Stain   Final    NO ORGANISMS SEEN CYTOSPIN SMEAR WBC PRESENT, PREDOMINANTLY MONONUCLEAR Performed at [facility], [address], [place], [place] [address]    Report Status 05/01/2018 FINAL  Final     Scheduled Meds: . amLODipine  5 mg Oral Daily  . aspirin  325 mg Oral Q breakfast  . carvedilol  6.25 mg Oral BID WC  . cephALEXin  250 mg Oral Q8H  . darbepoetin (ARANESP) injection - NON-DIALYSIS  60 mcg Subcutaneous Once  . feeding supplement (GLUCERNA  SHAKE)  237 mL Oral TID BM  . ferrous sulfate  325 mg Oral BID WC  . folic acid  1 mg Oral Daily  . metolazone  5 mg Oral Daily  . pantoprazole  40 mg Oral BID  . polyethylene glycol  17 g Oral Daily  . senna-docusate  1 tablet Oral BID  . sodium bicarbonate  650 mg Oral TID  . tamsulosin  0.4 mg Oral Daily  . torsemide  60 mg Oral Daily   Continuous Infusions:  Procedures/Studies: Ct Abdomen Pelvis Wo Contrast  Result Date: 04/23/2018 CLINICAL DATA:  84 year old male with abnormal liver function test. Patient was found unresponsive. EXAM: CT ABDOMEN AND PELVIS WITHOUT CONTRAST TECHNIQUE: Multidetector CT imaging of the abdomen and pelvis was performed following the standard protocol without IV contrast. COMPARISON:  CT of the abdomen pelvis dated 12/16/2012 FINDINGS: Evaluation of this exam is limited in the absence of intravenous contrast. Evaluation is also limited due to streak artifact caused by patient's arms. Lower chest: Partially visualized small right and moderate left pleural effusion with associated partial compressive atelectasis of the lower lobes. Somewhat solid-appearing 18 x 20 mm lesion in the lingula as well as an ill-defined 2.2 x 2.0 cm area in the left lung base (series 2, image 5) are not well evaluated on this noncontrast CT. These may represent areas of consolidative changes of the lungs although lung mass/neoplasm not excluded. Chest CT with contrast may provide better evaluation on nonemergent basis. There is coronary vascular calcification. Small pericardial effusion. There is hypoattenuation of the cardiac blood pool suggestive of a degree of anemia. Clinical correlation is recommended. No intra-abdominal free air. Diffuse mesenteric stranding. No free fluid. Hepatobiliary: The liver is grossly unremarkable on this noncontrast CT. No intrahepatic biliary ductal dilatation. Small stones within the gallbladder. No pericholecystic fluid. Pancreas: Unremarkable. No  pancreatic ductal dilatation or surrounding inflammatory changes. Spleen: Normal in size without focal abnormality. Adrenals/Urinary Tract: The adrenal glands are unremarkable. There is no hydronephrosis or nephrolithiasis on either side. The visualized ureters and urinary bladder appear unremarkable. Stomach/Bowel: There is moderate stool throughout the colon. There is no bowel obstruction or active inflammation. Normal appendix. Vascular/Lymphatic: Advanced aortoiliac atherosclerotic disease. No portal venous gas. There is no adenopathy. Multiple top-normal left inguinal lymph nodes, possibly reactive. Reproductive: Prostatectomy. Other: Midline vertical anterior abdominal wall incisional scar. Diffuse subcutaneous edema. Musculoskeletal: Osteopenia with degenerative changes of the spine. Old-appearing L3 compression fracture as well as old left femoral fracture status post prior internal fixation. No acute fracture. IMPRESSION: 1. No acute intra-abdominal or pelvic pathology. No bowel obstruction or active inflammation. Normal appendix. 2. Partially visualized small right and moderate left pleural effusion with associated partial compressive atelectasis of the lower lobes. 3. Ill-defined nodular densities in the lingula and left lung base. Chest CT with contrast may provide better evaluation on nonemergent basis. 4. Cholelithiasis. 5. Advanced Aortic Atherosclerosis (ICD10-I70.0). Electronically Signed   By: Anner Crete M.D.   On: 04/23/2018 03:18   Dg Chest 1 View  Result Date: 05/01/2018 CLINICAL DATA:  LEFT pleural effusion post thoracentesis EXAM: CHEST  1 VIEW COMPARISON:  04/30/2018 FINDINGS: Enlargement of cardiac silhouette with slight vascular congestion. Pulmonary edema slightly improved on LEFT. Decreased LEFT pleural effusion and basilar atelectasis post thoracentesis. No pneumothorax. Atherosclerotic calcification at aortic arch. Bones demineralized with advanced RIGHT glenohumeral  degenerative  changes. IMPRESSION: Decrease in LEFT pleural effusion and basilar atelectasis post thoracentesis without pneumothorax. Electronically Signed   By: Lavonia Dana M.D.   On: 05/01/2018 13:33   Ct Head Wo Contrast  Result Date: 04/23/2018 CLINICAL DATA:  84 year old male with altered mental status. Patient was found unresponsive. EXAM: CT HEAD WITHOUT CONTRAST TECHNIQUE: Contiguous axial images were obtained from the base of the skull through the vertex without intravenous contrast. COMPARISON:  Head CT dated 11/28/2012 FINDINGS: Brain: There is mild to moderate age-related atrophy and chronic microvascular ischemic changes. Right frontal hypodense area likely subacute or chronic with associated encephalomalacia. Clinical correlation is recommended. There is no acute intracranial hemorrhage. No mass effect or midline shift. No extra-axial fluid collection. Vascular: No hyperdense vessel or unexpected calcification. Skull: Normal. Negative for fracture or focal lesion. Sinuses/Orbits: Mild mucoperiosteal thickening of paranasal sinuses. No air-fluid levels. The mastoid air cells are clear. Other: None IMPRESSION: 1. No acute intracranial hemorrhage. 2. Age-related atrophy and chronic microvascular ischemic changes. Right frontal subacute or old infarct. Clinical correlation is recommended. Electronically Signed   By: Anner Crete M.D.   On: 04/23/2018 03:33   Ct Chest Wo Contrast  Result Date: 04/23/2018 CLINICAL DATA:  84 year old male found unresponsive with hypoglycemia. EXAM: CT CHEST WITHOUT CONTRAST TECHNIQUE: Multidetector CT imaging of the chest was performed following the standard protocol without IV contrast. COMPARISON:  Chest radiograph dated 04/22/2018 and CT of the abdomen pelvis dated 04/23/2018. FINDINGS: Evaluation of this exam is limited in the absence of intravenous contrast. Cardiovascular: There is no cardiomegaly. No significant pericardial effusion. Multi vessel coronary  vascular calcification. There is hypoattenuation of the cardiac blood pool suggestive of a degree of anemia. Clinical correlation is recommended. There is moderate atherosclerotic calcification of the thoracic aorta. The central pulmonary arteries are grossly unremarkable on this noncontrast CT. Mediastinum/Nodes: There is no hilar or mediastinal adenopathy. Evaluation is however limited in the absence of intravenous contrast. The esophagus is grossly unremarkable. Slight heterogeneity of the thyroid gland may be related to nodules. Ultrasound may provide better evaluation on a non emergent basis. No mediastinal fluid collection. Lungs/Pleura: Moderate-sized bilateral pleural effusions with associated partial compressive atelectasis of the lower lobes. Solid-appearing nodular lesions in the lingula (series 2, image 99 measuring 2.5 x 1.8 cm) and left lower lobe (series 2, image 106) are not well evaluated but may represent consolidative changes of the lungs although solid masses are not excluded. Close follow-up or further evaluation with CT with IV contrast is recommended. There is no pneumothorax. Central airways are patent. Upper Abdomen: Partially visualized gallstones. The visualized upper abdomen is otherwise unremarkable. Musculoskeletal: Diffuse subcutaneous edema and anasarca. Osteopenia with degenerative changes of the spine. No acute osseous pathology. IMPRESSION: 1. Moderate-sized bilateral pleural effusions with associated partial compressive atelectasis of the lower lobes. 2. Consolidative or atelectatic lungs versus solid nodular lesions in the lingula and left lower lobe. Close follow-up or further evaluation with CT with IV contrast is recommended. 3. Coronary vascular calcification andAortic Atherosclerosis (ICD10-I70.0). Electronically Signed   By: Anner Crete M.D.   On: 04/23/2018 06:57   Mr Jodene Nam Head Wo Contrast  Result Date: 05/01/2018 CLINICAL DATA:  Stroke follow-up EXAM: MRA HEAD  WITHOUT CONTRAST TECHNIQUE: Angiographic images of the Circle of Willis were obtained using MRA technique without intravenous contrast. COMPARISON:  Brain MRI from yesterday FINDINGS: The left ICA is slightly smaller than the right in the setting of left A1 hypoplasia. High-grade narrowing at the distal right M1  segment. Most of the left V4 segment is nonenhancing, likely from advanced atheromatous disease. The right vertebral and basilar are smooth and widely patent. High-grade narrowing at the proximal right P2 segment. IMPRESSION: 1. Intracranial atherosclerosis with advanced right M1/2 segment stenosis. 2. Left V4 segment occlusion with distal reconstitution. 3. Advanced right P2 stenosis. Electronically Signed   By: Monte Fantasia M.D.   On: 05/01/2018 11:07   Mr Brain Wo Contrast  Result Date: 04/30/2018 CLINICAL DATA:  84 y/o  M; altered mental status. EXAM: MRI HEAD WITHOUT CONTRAST TECHNIQUE: Multiplanar, multiecho pulse sequences of the brain and surrounding structures were obtained without intravenous contrast. COMPARISON:  04/23/2018 CT head.  04/20/2011 MRI head. FINDINGS: Brain: Severely motion degraded study. Chronic infarct within the right lateral frontal lobe. Reduced diffusion at the posterior margin of the right lateral frontal chronic infarction compatible with new or recrudescence acute/early subacute infarction. Additionally, there is a punctate focus of reduced diffusion in the right occipital lobe compatible with a small vessel acute/early subacute infarction. No associated mass effect or gross hemorrhage. No herniation. Vascular: Normal flow voids. Skull and upper cervical spine: Normal marrow signal. Sinuses/Orbits: Negative. Other: None. IMPRESSION: 1. Severe motion artifact. 2. Chronic right lateral frontal lobe infarction. Reduced diffusion at the posterior margin of the infarction is compatible with new or recrudescence acute/early subacute infarction. Additional punctate  acute/early subacute infarction in right occipital lobe. No associated hemorrhage or mass effect identified. These results will be called to the ordering clinician or representative by the Radiologist Assistant, and communication documented in the PACS or zVision Dashboard. Electronically Signed   By: Kristine Garbe M.D.   On: 04/30/2018 18:37   US Carotid Bilateral  Result Date: 05/01/2018 CLINICAL DATA:  Stroke follow-up EXAM: BILATERAL CAROTID DUPLEX ULTRASOUND TECHNIQUE: Pearline Cables scale imaging, color Doppler and duplex ultrasound were performed of bilateral carotid and vertebral arteries in the neck. COMPARISON:  None. FINDINGS: Criteria: Quantification of carotid stenosis is based on velocity parameters that correlate the residual internal carotid diameter with NASCET-based stenosis levels, using the diameter of the distal internal carotid lumen as the denominator for stenosis measurement. The following velocity measurements were obtained: RIGHT ICA: 152 cm/sec CCA: 78 cm/sec SYSTOLIC ICA/CCA RATIO:  1.9 ECA: 124 cm/sec LEFT ICA: 106 cm/sec CCA: 69 cm/sec SYSTOLIC ICA/CCA RATIO:  1.5 ECA: 132 cm/sec RIGHT CAROTID ARTERY: Moderate calcified plaque in the bulb with shadowing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

## 2018-05-01 NOTE — Telephone Encounter (Signed)
Dr Tat called and asked for patient to have 30 day event monitor for CVA for patient.He is being discharged today to [facility] at [facility].Dr.Tat requests report go to pcp [place]   DOD today is Dr.Branch

## 2018-05-01 NOTE — Progress Notes (Signed)
Daily Progress Note   Patient Name: Mekiah Cambridge Keelin       Date: 05/01/2018 DOB: 06-13-1927  Age: 84 y.o. MRN#: [id number] Attending Physician: Orson Eva, MD Primary Care Physician: Lemmie Evens, MD Admit Date: 04/22/2018  Reason for Consultation/Follow-up: Establishing goals of care  Subjective: Mr. Whitefield is awake and alert. Pleasantly confused. NT at bedside assisting him with his meal. He is accepting and eating but slowly.   Length of Stay: 8  Current Medications: Scheduled Meds:  . amLODipine  5 mg Oral Daily  . aspirin  325 mg Oral Q breakfast  . carvedilol  6.25 mg Oral BID WC  . cephALEXin  250 mg Oral Q8H  . feeding supplement (GLUCERNA SHAKE)  237 mL Oral TID BM  . ferrous sulfate  325 mg Oral BID WC  . folic acid  1 mg Oral Daily  . metolazone  5 mg Oral Daily  . pantoprazole  40 mg Oral BID  . polyethylene glycol  17 g Oral Daily  . senna-docusate  1 tablet Oral BID  . sodium bicarbonate  650 mg Oral TID  . tamsulosin  0.4 mg Oral Daily  . torsemide  60 mg Oral Daily    Continuous Infusions:   PRN Meds: ipratropium-albuterol, prochlorperazine  Physical Exam  Constitutional: He appears well-developed. He appears ill.  Frail, elderly  HENT:  Head: Normocephalic and atraumatic.  Cardiovascular: Normal rate.  Pulmonary/Chest: Effort normal. No accessory muscle usage. No tachypnea. No respiratory distress.  Abdominal: Normal appearance.  Neurological: He is alert. He is disoriented.  Pleasantly confused  Nursing note and vitals reviewed.           Vital Signs: BP (!) 126/56   Pulse 72   Temp 97.8 F (36.6 C) (Oral)   Resp 18   Ht 5\' 8"  (1.727 m)   Wt 76.1 kg   SpO2 94%   BMI 25.51 kg/m  SpO2: SpO2: 94 % O2 Device: O2 Device: Room Air O2 Flow  Rate: O2 Flow Rate (L/min): 2 L/min  Intake/output summary:   Intake/Output Summary (Last 24 hours) at 05/01/2018 1009 Last data filed at 05/01/2018 0734 Gross per 24 hour  Intake 360 ml  Output 3350 ml  Net -2990 ml   LBM: Last BM Date: 04/27/18 Baseline Weight: Weight: 69.9 kg Most  recent weight: Weight: 76.1 kg       Palliative Assessment/Data: 30%    Flowsheet Rows     Most Recent Value  Intake Tab  Referral Department  Hospitalist  Unit at Time of Referral  Med/Surg Unit  Date Notified  04/30/18  Palliative Care Type  New Palliative care  Reason for referral  Clarify Goals of Care  Date of Admission  04/22/18  Date first seen by Palliative Care  04/30/18  # of days Palliative referral response time  0 Day(s)  # of days IP prior to Palliative referral  8  Clinical Assessment  Psychosocial & Spiritual Assessment  Palliative Care Outcomes      Patient Active Problem List   Diagnosis Date Noted  . Sepsis due to coagulase-negative staphylococcal infection ([place]) 04/30/2018  . Aspiration pneumonitis ([place]) 04/30/2018  . Acute renal failure superimposed on stage 4 chronic kidney disease ([place]) 04/30/2018  . Chronic bilateral pleural effusions 04/30/2018  . Acute metabolic encephalopathy 46/96/2952  . Failure to thrive in adult   . DNR (do not resuscitate)   . Goals of care, counseling/discussion   . Palliative care encounter   . Sepsis ([place]) 04/23/2018  . Displaced intertrochanteric fracture of left femur, initial encounter for closed fracture ([place])   . Hip fracture ([place]) 12/28/2017  . Hip fracture, unspecified laterality, closed, initial encounter ([place]) 12/28/2017  . Encephalopathy 12/28/2017  . Closed fracture of left ulnar styloid 11/28/2012  . Dizziness 11/28/2012  . Acute renal failure ([place]) 04/19/2011  . Proximal humeral fracture 04/19/2011  . Fall 04/19/2011  . DM (diabetes mellitus) ([place]) 04/19/2011  . HTN (hypertension) 04/19/2011  . Anemia 04/19/2011    . Dementia ([place]) 04/19/2011  . BPH (benign prostatic hyperplasia) 04/19/2011    Palliative Care Assessment & Plan   HPI: 84 y.o. male  with past medical history of dementia, Barrett's esophagus, CKD stage 4, anemia, DM2 admitted on 04/22/2018 with unresponsive at home and EMS called and found to hypoglycemic and admitted with sepsis, AMS, worsening renal function. Has had little improvement over admission and with overall adult failure to thrive.    Assessment: Mr. Kaser appears more alert today. NT is assisting him with breakfast (he ate all his breakfast but it did take him quite some time). Unfortunately MRI does show that he has had a stroke. I called and spoke with Tammy (son's girlfriend that helps son process information and provide care to Mr. Kotowski) and shared the news of a stroke. We discussed that this could explain a lot of the symptoms and decline we have been seeing. Tammy wonders if this may have occurred the day they called EMS as "he was acting strange." They are saddened but understand. They also understand that there are no further options or recommendations from neurology to address the stroke. Tammy has good understanding that Mr. Flanery prognosis is poor but Major is struggling with poor prognosis. We also discussed thoracentesis is complete and hopefully this will help him to feel better.   Tammy also confirms that they do plan for Mr. Wainright to transition to SNF rehab upon discharge just not to the facility they had a bad experience. They are working with [facility]. She tells me "don't send him out too soon he is still so sick." I explained that the doctors are still working on him and treating which is why they have done all these tests and procedures. Emotional support provided.   Tammy will update family on information provided. She  seemed rushed to get off the phone so I did not press conversation. I plan to give her some information on hospice tomorrow although I believe SNF rehab  is the best option right now. They would not agree to hospice facility (I have not brought this up - this is observed from my conversations regarding their goals of care and expectations and desire for treatment) and Mr. Ericson has significant care needs that home is a poor option as they have been struggling to care for him.   I will follow up and discuss further tomorrow.   Recommendations/Plan:  Fluctuating intake: Adding Glucerna TID. Encourage intake at mealtime with careful hand feeding. Intake better today.   Weakness: PT following. For SNF rehab.   Goals of Care and Additional Recommendations:  Limitations on Scope of Treatment: Full Scope Treatment  Code Status:  DNR  Prognosis:   Prognosis very poor. Would be eligible for hospice facility IF family elected full comfort care. With aggressive care prognosis likely only weeks to months.    Discharge Planning:  [facility] for rehab with Palliative care service follow-up  Care plan was discussed with Dr. Carles Collet.   Thank you for allowing the Palliative Medicine Team to assist in the care of this patient.   Total Time 35 min Prolonged Time Billed  no       Greater than 50%  of this time was spent counseling and coordinating care related to the above assessment and plan.  Vinie Sill, NP Palliative Medicine Team Pager # [phone] (M-F 8a-5p) Team Phone # [phone] (Nights/Weekends)

## 2018-05-01 NOTE — Progress Notes (Signed)
Thoracentesis complete no signs of distress.  

## 2018-05-02 LAB — RENAL FUNCTION PANEL
Albumin: 2.3 g/dL — ABNORMAL LOW (ref 3.5–5.0)
Anion gap: 9 (ref 5–15)
BUN: 105 mg/dL — ABNORMAL HIGH (ref 8–23)
CHLORIDE: 104 mmol/L (ref 98–111)
CO2: 24 mmol/L (ref 22–32)
CREATININE: 3.34 mg/dL — AB (ref 0.61–1.24)
Calcium: 8.4 mg/dL — ABNORMAL LOW (ref 8.9–10.3)
GFR, EST AFRICAN AMERICAN: 17 mL/min — AB (ref >60)
GFR, EST NON AFRICAN AMERICAN: 15 mL/min — AB (ref >60)
Glucose, Bld: 105 mg/dL — ABNORMAL HIGH (ref 70–99)
POTASSIUM: 4.7 mmol/L (ref 3.5–5.1)
Phosphorus: 4.9 mg/dL — ABNORMAL HIGH (ref 2.5–4.6)
Sodium: 137 mmol/L (ref 135–145)

## 2018-05-02 LAB — CBC
HEMATOCRIT: 29.3 % — AB (ref 39.0–52.0)
HEMOGLOBIN: 9 g/dL — AB (ref 13.0–17.0)
MCH: 29.3 pg (ref 26.0–34.0)
MCHC: 30.7 g/dL (ref 30.0–36.0)
MCV: 95.4 fL (ref 80.0–100.0)
Platelets: 216 10*3/uL (ref 150–400)
RBC: 3.07 MIL/uL — ABNORMAL LOW (ref 4.22–5.81)
RDW: 15.1 % (ref 11.5–15.5)
WBC: 6.7 10*3/uL (ref 4.0–10.5)
nRBC: 0 % (ref 0.0–0.2)

## 2018-05-02 LAB — COMPREHENSIVE METABOLIC PANEL
ALBUMIN: 2.3 g/dL — AB (ref 3.5–5.0)
ALK PHOS: 68 U/L (ref 38–126)
ALT: 13 U/L (ref 0–44)
AST: 13 U/L — ABNORMAL LOW (ref 15–41)
Anion gap: 11 (ref 5–15)
BILIRUBIN TOTAL: 0.4 mg/dL (ref 0.3–1.2)
BUN: 104 mg/dL — AB (ref 8–23)
CALCIUM: 8.3 mg/dL — AB (ref 8.9–10.3)
CO2: 23 mmol/L (ref 22–32)
CREATININE: 3.29 mg/dL — AB (ref 0.61–1.24)
Chloride: 102 mmol/L (ref 98–111)
GFR calc Af Amer: 18 mL/min — ABNORMAL LOW (ref >60)
GFR, EST NON AFRICAN AMERICAN: 15 mL/min — AB (ref >60)
GLUCOSE: 104 mg/dL — AB (ref 70–99)
Potassium: 4.6 mmol/L (ref 3.5–5.1)
Sodium: 136 mmol/L (ref 135–145)
TOTAL PROTEIN: 6 g/dL — AB (ref 6.5–8.1)

## 2018-05-02 LAB — GLUCOSE, CAPILLARY
GLUCOSE-CAPILLARY: 104 mg/dL — AB (ref 70–99)
GLUCOSE-CAPILLARY: 173 mg/dL — AB (ref 70–99)
Glucose-Capillary: 101 mg/dL — ABNORMAL HIGH (ref 70–99)
Glucose-Capillary: 113 mg/dL — ABNORMAL HIGH (ref 70–99)
Glucose-Capillary: 115 mg/dL — ABNORMAL HIGH (ref 70–99)
Glucose-Capillary: 117 mg/dL — ABNORMAL HIGH (ref 70–99)

## 2018-05-02 LAB — LACTATE DEHYDROGENASE: LDH: 138 U/L (ref 98–192)

## 2018-05-02 MED ORDER — TORSEMIDE 20 MG PO TABS
20.0000 mg | ORAL_TABLET | Freq: Every day | ORAL | Status: DC
  Administered 2018-05-02 – 2018-05-04 (×3): 20 mg via ORAL
  Filled 2018-05-02 (×2): qty 1

## 2018-05-02 NOTE — Clinical Social Work Note (Signed)
CSW spoke with Jackelyn Poling at [facility], who stated that pt will likely not be authorized by [facility] as he has not had 60 days of wellness.  Also, she talked to the son's girlfriend and made sure that she understood that [facility] would become payee for patient and would get his check.  Jackelyn Poling stated that girlfriend will need to talk to son about this, and that this was new information to them. She is awaiting c/b.

## 2018-05-02 NOTE — Progress Notes (Signed)
Palliative:  [name] is resting comfortably. He has eaten 80% of breakfast (only did not eat oatmeal). Intake appears to be improved. Renal function appears stable. Will need continued patient and careful hand feeding at SNF as he is high risk for aspiration as well as dehydration which could easily lead to worsening renal function. Family does understand this and also understand that he would not be a candidate for dialysis.   I attempted twice to reach [name] and [name] without success. I did leave a message and requested a call back. I did receive a voicemail from [place], Mr. Klemann stepdaughter. I did call Kendrick Fries back and confirmed the information that she had received from [name] (stroke and thoracentesis) and let her know that Ms. Weissmann is stable and eating a little better. I did not provide any further medical information as I have not had permission to do so from [name] (or [name]). Plans in place now for likely d/c to SNF rehab tomorrow or when insurance authorization comes through.   Exam: Lethargic, sleeping comfortably. No distress. Lungs clear and on room air.   25 min  Vinie Sill, NP Palliative Medicine Team Pager # [phone] (M-F 8a-5p) Team Phone # [phone] (Nights/Weekends)

## 2018-05-02 NOTE — Clinical Social Work Note (Signed)
CSW following. Per MD, pt will be medically stable for dc tomorrow. Spoke with Tammy (pt's son's girlfriend) by phone. She speaks on behalf of pt's son. Family agreeable to SNF rehab at [facility]. Updated Debbie at [facility] on dc date. She is continuing to work on authorization. Pt will need this auth before he can transfer. Weekend CSW [phone]) will be available to assist.

## 2018-05-02 NOTE — Progress Notes (Signed)
Physical Therapy Treatment Patient Details Name: [name] MRN: [id number] DOB: 1926/11/12 Today's Date: 05/02/2018    History of Present Illness [name]  is a 84 y.o. male, w h/o Dementia,  dm2, CKD stage 4, Gerd, Barrett's esophagus, anemia, apparently presents with altered mental status , found to be hypoglycemic,  Pt arrived in ED after glucagon with bs 78.  Pt febrile in ED.     PT Comments    Patient presents slightly lethargic and requires encouragement to participate with therapy.  Patient demonstrates slow labored movement with much assistance to sit up at bedside, tolerated sitting up for approximately 10-15 minutes with fair sitting balance, occasionally lean backwards or rest on elbows once fatigued.  Patient unable to stand due to BLE weakness and poor use of RUE for gripping RW due to pain and fragile skin.  Patient required Max assist to put back to bed and reposition.  Patient will benefit from continued physical therapy in hospital and recommended venue below to increase strength, balance, endurance for safe ADLs and gait.    Follow Up Recommendations  SNF;Supervision/Assistance - 24 hour;Supervision for mobility/OOB     Equipment Recommendations  None recommended by PT    Recommendations for Other Services       Precautions / Restrictions Precautions Precautions: Fall Restrictions Weight Bearing Restrictions: No    Mobility  Bed Mobility Overal bed mobility: Needs Assistance Bed Mobility: Rolling;Supine to Sit;Sit to Supine Rolling: Max assist   Supine to sit: Max assist Sit to supine: Max assist   General bed mobility comments: slow labored movement  Transfers                    Ambulation/Gait                 Stairs             Wheelchair Mobility    Modified Rankin (Stroke Patients Only)       Balance Overall balance assessment: Needs assistance Sitting-balance support: Feet supported;No upper extremity  supported Sitting balance-Leahy Scale: Fair                                      Cognition Arousal/Alertness: Awake/alert;Lethargic Behavior During Therapy: Flat affect Overall Cognitive Status: History of cognitive impairments - at baseline                                        Exercises      General Comments        Pertinent Vitals/Pain Pain Assessment: Faces Faces Pain Scale: Hurts little more Pain Location: pressure to RUE Pain Descriptors / Indicators: Grimacing;Discomfort;Moaning Pain Intervention(s): Limited activity within patient's tolerance;Monitored during session    Home Living                      Prior Function            PT Goals (current goals can now be found in the care plan section) Acute Rehab PT Goals Patient Stated Goal: none stated Time For Goal Achievement: 05/09/18 Potential to Achieve Goals: Fair Progress towards PT goals: Progressing toward goals    Frequency    Min 3X/week      PT Plan Current plan remains appropriate    Co-evaluation  AM-PAC PT "6 Clicks" Daily Activity  Outcome Measure  Difficulty turning over in bed (including adjusting bedclothes, sheets and blankets)?: Unable Difficulty moving from lying on back to sitting on the side of the bed? : Unable Difficulty sitting down on and standing up from a chair with arms (e.g., wheelchair, bedside commode, etc,.)?: Unable Help needed moving to and from a bed to chair (including a wheelchair)?: Total Help needed walking in hospital room?: Total Help needed climbing 3-5 steps with a railing? : Total 6 Click Score: 6    End of Session   Activity Tolerance: Patient limited by fatigue;Patient limited by lethargy;Patient limited by pain Patient left: in bed;with call bell/phone within reach;with bed alarm set Nurse Communication: Mobility status PT Visit Diagnosis: Unsteadiness on feet (R26.81);Other abnormalities of  gait and mobility (R26.89);Muscle weakness (generalized) (M62.81)     Time: 2585-2778 PT Time Calculation (min) (ACUTE ONLY): 18 min  Charges:  $Therapeutic Activity: 8-22 mins                     11:58 AM, 05/02/18 Lonell Grandchild, MPT Physical Therapist with [facility] [phone] office [phone] mobile phone

## 2018-05-02 NOTE — Plan of Care (Signed)

## 2018-05-02 NOTE — Progress Notes (Signed)
Subjective: Interval History: Patient offers no complaints.  Denies any difficulty breathing.  Objective: Vital signs in last 24 hours: Temp:  [97.5 F (36.4 C)-98.7 F (37.1 C)] 98.7 F (37.1 C) (11/08 0544) Pulse Rate:  [64-77] 64 (11/08 0544) Resp:  [18-20] 20 (11/08 0544) BP: (103-141)/(37-66) 141/37 (11/08 0544) SpO2:  [93 %-98 %] 96 % (11/08 0544) Weight:  [75.6 kg] 75.6 kg (11/08 0705) Weight change:   Intake/Output from previous day: 11/07 0701 - 11/08 0700 In: 480 [P.O.:480] Out: 2650 [Urine:2650] Intake/Output this shift: Total I/O In: -  Out: 400 [Urine:400]  Generally patient is alert.  He does not seem to be in any apparent distress. Chest: He has some inspiratory crackles Heart exam revealed regular rate and rhythm Extremities he has no  edema  Lab Results: Recent Labs    05/01/18 0445 05/02/18 0559  WBC 7.3 6.7  HGB 7.9* 9.0*  HCT 26.4* 29.3*  PLT 222 216   BMET:  Recent Labs    05/01/18 0445 05/02/18 0559  NA 139 136  137  K 4.7 4.6  4.7  CL 109 102  104  CO2 21* 23  24  GLUCOSE 110* 104*  105*  BUN 88* 104*  105*  CREATININE 3.32* 3.29*  3.34*  CALCIUM 8.2* 8.3*  8.4*   No results for input(s): PTH in the last 72 hours. Iron Studies: No results for input(s): IRON, TIBC, TRANSFERRIN, FERRITIN in the last 72 hours.  Studies/Results: Dg Chest 1 View  Result Date: 05/01/2018 CLINICAL DATA:  LEFT pleural effusion post thoracentesis EXAM: CHEST  1 VIEW COMPARISON:  04/30/2018 FINDINGS: Enlargement of cardiac silhouette with slight vascular congestion. Pulmonary edema slightly improved on LEFT. Decreased LEFT pleural effusion and basilar atelectasis post thoracentesis. No pneumothorax. Atherosclerotic calcification at aortic arch. Bones demineralized with advanced RIGHT glenohumeral degenerative changes. IMPRESSION: Decrease in LEFT pleural effusion and basilar atelectasis post thoracentesis without pneumothorax. Electronically Signed    By: Lavonia Dana M.D.   On: 05/01/2018 13:33   Mr Jodene Nam Head Wo Contrast  Result Date: 05/01/2018 CLINICAL DATA:  Stroke follow-up EXAM: MRA HEAD WITHOUT CONTRAST TECHNIQUE: Angiographic images of the Circle of Willis were obtained using MRA technique without intravenous contrast. COMPARISON:  Brain MRI from yesterday FINDINGS: The left ICA is slightly smaller than the right in the setting of left A1 hypoplasia. High-grade narrowing at the distal right M1 segment. Most of the left V4 segment is nonenhancing, likely from advanced atheromatous disease. The right vertebral and basilar are smooth and widely patent. High-grade narrowing at the proximal right P2 segment. IMPRESSION: 1. Intracranial atherosclerosis with advanced right M1/2 segment stenosis. 2. Left V4 segment occlusion with distal reconstitution. 3. Advanced right P2 stenosis. Electronically Signed   By: Monte Fantasia M.D.   On: 05/01/2018 11:07   Mr Brain Wo Contrast  Result Date: 04/30/2018 CLINICAL DATA:  84 y/o  M; altered mental status. EXAM: MRI HEAD WITHOUT CONTRAST TECHNIQUE: Multiplanar, multiecho pulse sequences of the brain and surrounding structures were obtained without intravenous contrast. COMPARISON:  04/23/2018 CT head.  04/20/2011 MRI head. FINDINGS: Brain: Severely motion degraded study. Chronic infarct within the right lateral frontal lobe. Reduced diffusion at the posterior margin of the right lateral frontal chronic infarction compatible with new or recrudescence acute/early subacute infarction. Additionally, there is a punctate focus of reduced diffusion in the right occipital lobe compatible with a small vessel acute/early subacute infarction. No associated mass effect or gross hemorrhage. No herniation. Vascular: Normal flow voids.  Skull and upper cervical spine: Normal marrow signal. Sinuses/Orbits: Negative. Other: None. IMPRESSION: 1. Severe motion artifact. 2. Chronic right lateral frontal lobe infarction. Reduced  diffusion at the posterior margin of the infarction is compatible with new or recrudescence acute/early subacute infarction. Additional punctate acute/early subacute infarction in right occipital lobe. No associated hemorrhage or mass effect identified. These results will be called to the ordering clinician or representative by the Radiologist Assistant, and communication documented in the PACS or zVision Dashboard. Electronically Signed   By: Kristine Garbe M.D.   On: 04/30/2018 18:37   US Carotid Bilateral  Result Date: 05/01/2018 CLINICAL DATA:  Stroke follow-up EXAM: BILATERAL CAROTID DUPLEX ULTRASOUND TECHNIQUE: Pearline Cables scale imaging, color Doppler and duplex ultrasound were performed of bilateral carotid and vertebral arteries in the neck. COMPARISON:  None. FINDINGS: Criteria: Quantification of carotid stenosis is based on velocity parameters that correlate the residual internal carotid diameter with NASCET-based stenosis levels, using the diameter of the distal internal carotid lumen as the denominator for stenosis measurement. The following velocity measurements were obtained: RIGHT ICA: 152 cm/sec CCA: 78 cm/sec SYSTOLIC ICA/CCA RATIO:  1.9 ECA: 124 cm/sec LEFT ICA: 106 cm/sec CCA: 69 cm/sec SYSTOLIC ICA/CCA RATIO:  1.5 ECA: 132 cm/sec RIGHT CAROTID ARTERY: Moderate calcified plaque in the bulb with shadowing of the lumen. Low resistance internal carotid Doppler pattern is preserved. RIGHT VERTEBRAL ARTERY:  Antegrade. LEFT CAROTID ARTERY: There is scattered mixed plaque throughout the left common carotid artery. This continues into the bulb. No obvious significant luminal narrowing is identified. LEFT VERTEBRAL ARTERY:  Nonvisualized. IMPRESSION: 50-69% stenosis in the right internal carotid artery. Less than 50% stenosis in the left internal carotid artery. The left vertebral artery cannot be identified.  It may be occluded. The right vertebral artery is antegrade in flow. Electronically  Signed   By: Marybelle Killings M.D.   On: 05/01/2018 13:02   Dg Chest Port 1 View  Result Date: 04/30/2018 CLINICAL DATA:  Chronic bilateral pleural effusions. EXAM: PORTABLE CHEST 1 VIEW COMPARISON:  CT 04/23/2018.  Chest x-ray 04/22/2018. FINDINGS: Mediastinum hilar structures normal. Cardiomegaly with diffuse bilateral from interstitial prominence and bilateral pleural effusions consistent with CHF. Underlying lingular/left base lesions noted on recent CT may be obscured by pulmonary edema and effusion. Continued follow-up exams again suggested as noted on prior CT of 04/23/2018. Severe degenerative changes both shoulders. IMPRESSION: Congestive heart failure with bilateral pulmonary interstitial edema and bilateral pleural effusions, particularly prominent on the left. 2. Lingular/left base lesions cyst noted on recent CT may be obscured by pulmonary edema and effusion. Continued follow-up exams suggested as noted on prior CT of 04/23/2018. Electronically Signed   By: Marcello Moores  Register   On: 04/30/2018 12:51   US Thoracentesis Asp Pleural Space W/img Guide  Result Date: 05/01/2018 INDICATION: LEFT pleural effusion EXAM: ULTRASOUND GUIDED DIAGNOSTIC AND THERAPEUTIC LEFT THORACENTESIS MEDICATIONS: None. COMPLICATIONS: None immediate. PROCEDURE: Written informed consent for procedure was obtained from the patient's son. Time out protocol followed. Pleural effusion localized by ultrasound at the posterior LEFT hemithorax. Skin prepped and draped in usual sterile fashion. Skin and soft tissues anesthetized with 10 mL of 1% lidocaine. 8 French thoracentesis catheter placed into the LEFT pleural space. 700 mL of clear yellow fluid aspirated by syringe pump. Procedure tolerated well by patient without immediate complication. FINDINGS: A total of approximately 700 mL of LEFT pleural fluid was removed. Samples were sent to the laboratory as requested by the clinical team. IMPRESSION: Successful ultrasound guided LEFT  thoracentesis yielding 700 mL of pleural fluid. Electronically Signed   By: Lavonia Dana M.D.   On: 05/01/2018 13:36    I have reviewed the patient's current medications.  Assessment/Plan: 1] possibly acute kidney injury superimposed on chronic.  His creatinine remains stable but his BUN is increasing.  Possibly from fluid removal i.e. prerenal syndrome. 2] fluid management: Patient on Demadex and metolazone orally..  He has 2600 cc of urine output.  Patient at this moment does not have any significant sign of fluid overload. 3] low CO2: Possibly metabolic acidosis.  Patient is started on sodium bicarbonate.  His CO2 is 24 presently normal. 4] bone and mineral disorder: His calcium and phosphorus is range.  Diet controlled. 5] hypertension: His blood pressure is reasonably controlled 6] history of diabetes : His blood sugar is reasonably controlled 7] anemia: Anemia secondary to chronic renal failure.  Patient is on Aranesp once a week.  His hemoglobin is somewhat better. Plan: 1] we will hold metolazone and decrease Demadex to 20 mg once a day. 2] we will check his renal panel and CBC in the morning.    LOS: 9 days   Dametrius Sanjuan S 05/02/2018,8:44 AM

## 2018-05-02 NOTE — Progress Notes (Signed)
PROGRESS NOTE  [name] JQB:[id number] DOB: [birth year] DOA: 04/22/2018 PCP: Lemmie Evens, MD  Brief History: 84 y/o male with history of CKD 4, DM2, and dementia presented on 10/29 when he was found unresponsive. Initial CBG was read "low". The patient was given oral glucagon. When he arrived in the ED his CBG was 78. When I asked the patient how he is doing he states "I feel fine". However he is unable to answer any kind of detailed questions such as where he is located in [place] going on. The patient fractured his femur in July 2019. It was surgically repaired by Dr. Ninfa Linden on 12/28/2017, and the patient went to skilled nursing facility on 01/03/2018. Since that period of time, the patient has had a functional decline to the point where he is essentially bedbound at this point. On the evening prior to admission, the patient was noted to be confused and has had decreased oral intake. Since admission, the patient was treated for sepsis secondary to aspiration pneumonitis. He was noted to have acute on chronic renal failure. Nephrology was consulted to assist with management. The patient was noted to have fluid overload and was started on intravenous furosemide.  He was subsequently transitioned to torsemide.  His renal function stabilized.  The patient was treated with intravenous antibiotics for his pneumonia and bacteremia.  He was subsequently transitioned to oral cephalexin.  MRI of the brain showed that the patient had a subacute stroke.  Neurology did not feel the patient was a candidate for aggressive management.  They recommended starting aspirin and optimizing risk factor management.  Assessment/Plan: Sepsis -Present at the time of admission -Secondary to aspiration pneumoniaand bacteremia -Patient finished 5 days of antibiotics -Sepsis physiology resolved  Acute metabolic encephalopathy -Multifactorial including infectious process, dehydration,  progression of renal failure, stroke and hypoglycemia -Patient is alert oriented x2 -TSH 0.662 -B12--1647 -check IOXBDZH--29 -check folic acid--pending -MR brain--early subacute R-frontal stroke; punctate early subacute stroke R-occipital -UA--neg for pyruia -11/8--pt is more alert and eating better;  Remains intermittently confused  Staphylococcus shleiferi Bacteremia -initially on ceftriaxone-->cephalexin x 7 days  Anasarca/pleural effusions -due to progression of CKD and hypoalbuminemia -continue torsemide 60 mg dailyand metolazone -repeat CXR--personally rleviewed--pulmonary edema and L>R pleural effusion -04/23/2018 echo EF 60-65%, no WMA, small pericardial effusion, mild TR, PASP 34 -Check urine protein/creatinine ratio--0.53 -11/7--thoracocentesis--700 cc removed -now stable on RN since thoracocentesis -pleural fluid consistent with parapneumonic effusion  Acute on chronic renal failure--CKD stage 4 -previous baseline 2.3-2.6 -now has new renal baseline 3.1-3.4 -continue torsemide per renal -11/8--case discussed with renal, Dr. Lavena Bullion for d/c  Acute/Subacute ischemic stroke -MRI brain--as above -MRA brain--advanced R- M2 segment stenosis and R-P2 stenosis -carotid US--Right--50-69% stenosis, L--no hemodynamically significant stenosis -LDL 55 -A1C--pending -PT-->SNF, family refuses -speech eval-->dyshagia 2 diet -continue ASA -11/7--case discussed with neurology--Dr. Arora-->pt is not a candidate for aggressive/invasive intervention-->continue ASA--set up 30 day event monitor after d/c  Diabetes mellitus type 2 with nephropathy -12/30/2017 hemoglobin A 1 C--5.5 -Patient has had intermittent hypoglycemia secondary to poor oral intake -Discontinue NovoLog sliding scale -Continue intermittent CBG checks  Essential hypertension -Continue amlodipine, carvedilol  Dementia without behavioral disturbance -Continue supportive care  Anemia of  CKD -Baseline hemoglobin 8-9 -Patient was transfused 2 units PRBC during this admission -Iron saturation 4%, ferritin 838 -Serum J24 2683 -Folic acid 41.9  Metabolic acidosis -Secondary to CKD stage IV -Continue bicarbonate per nephrology  Goals of Care -palliative medicine  consulted--case discussed with Vinie Sill -pt is no DNR     Disposition Plan: SNF 11/9 if stable Family Communication:Tammy/son updated on 11/8   Consultants:Palliative medicine  Code Status: DNR  DVT Prophylaxis: SCDs   Procedures: As Listed in Progress Note Above  Antibiotics: Cephalexin 04/28/2018>>> Ceftriaxone 04/26/2018>>>04/28/2018 Augmentin 04/24/2018>>>04/26/2018 Cefepime 10/30/2019x1     Subjective: Patient is awake and alert.  He is pleasantly confused.  He is able to follow some simple one-step commands.  He denies any fevers, chills, chest pain, shortness of breath.  There is no reports of rectal pain, vomiting, spent respiratory distress, diarrhea.  Objective: Vitals:   05/01/18 1927 05/01/18 2118 05/02/18 0544 05/02/18 0705  BP:  (!) 127/51 (!) 141/37   Pulse:  77 64   Resp:  18 20   Temp:  (!) 97.5 F (36.4 C) 98.7 F (37.1 C)   TempSrc:  Oral Oral   SpO2: 93% 95% 96%   Weight:    75.6 kg  Height:        Intake/Output Summary (Last 24 hours) at 05/02/2018 1108 Last data filed at 05/02/2018 0900 Gross per 24 hour  Intake 480 ml  Output 1050 ml  Net -570 ml   Weight change:  Exam:   General:  Pt is alert, occasionally follows commands appropriately, not in acute distress  HEENT: No icterus, No thrush, No neck mass, [place]/AT  Cardiovascular: RRR, S1/S2, no rubs, no gallops  Respiratory: Bibasilar crackles but no wheezing.  Good air movement.  Abdomen: Soft/+BS, non tender, non distended, no guarding  Extremities: Trace lower extremity edema, No lymphangitis, No petechiae, No rashes, no synovitis   Data Reviewed: I have personally  reviewed following labs and imaging studies Basic Metabolic Panel: Recent Labs  Lab 04/26/18 1844  04/28/18 0539 04/29/18 0434 04/30/18 0458 05/01/18 0445 05/02/18 0559  NA  --    < > 138 139  140 139 139 136  137  K  --    < > 4.8 4.7  4.6 4.7 4.7 4.6  4.7  CL  --    < > 115* 114*  114* 111 109 102  104  CO2  --    < > 16* 16*  17* 17* 21* 23  24  GLUCOSE  --    < > 120* 108*  109* 82 110* 104*  105*  BUN  --    < > 67* 77*  78* 81* 88* 104*  105*  CREATININE  --    < > 3.38*  3.28* 3.38*  3.39* 3.32* 3.32* 3.29*  3.34*  CALCIUM  --    < > 8.0* 8.2*  8.0* 7.9* 8.2* 8.3*  8.4*  MG 1.6*  --   --   --   --   --   --   PHOS  --    < > 5.0* 5.1*  5.2* 4.8* 4.9* 4.9*   < > = values in this interval not displayed.   Liver Function Tests: Recent Labs  Lab 04/28/18 0539 04/29/18 0434 04/30/18 0458 05/01/18 0445 05/02/18 0559  AST  --   --   --   --  13*  ALT  --   --   --   --  13  ALKPHOS  --   --   --   --  68  BILITOT  --   --   --   --  0.4  PROT  --   --   --   --  6.0*  ALBUMIN 2.2* 2.1*  2.2* 2.1* 2.2* 2.3*  2.3*   No results for input(s): LIPASE, AMYLASE in the last 168 hours. Recent Labs  Lab 04/30/18 1409  AMMONIA 22   Coagulation Profile: No results for input(s): INR, PROTIME in the last 168 hours. CBC: Recent Labs  Lab 04/27/18 0637 05/01/18 0445 05/02/18 0559  WBC 8.5 7.3 6.7  HGB 9.2* 7.9* 9.0*  HCT 31.0* 26.4* 29.3*  MCV 96.9 100.0 95.4  PLT 170 222 216   Cardiac Enzymes: Recent Labs  Lab 04/30/18 1409  CKTOTAL 58   BNP: Invalid input(s): POCBNP CBG: Recent Labs  Lab 05/01/18 1659 05/01/18 2029 05/02/18 0000 05/02/18 0524 05/02/18 0732  GLUCAP 113* 176* 117* 104* 101*   HbA1C: Recent Labs    05/01/18 1004  HGBA1C 5.1   Urine analysis:    Component Value Date/Time   COLORURINE STRAW (A) 04/30/2018 1132   APPEARANCEUR CLEAR 04/30/2018 1132   LABSPEC 1.015 04/30/2018 1132   PHURINE 5.5 04/30/2018 1132    GLUCOSEU NEGATIVE 04/30/2018 1132   HGBUR NEGATIVE 04/30/2018 1132   BILIRUBINUR NEGATIVE 04/30/2018 1132   KETONESUR NEGATIVE 04/30/2018 1132   PROTEINUR NEGATIVE 04/30/2018 1132   UROBILINOGEN 0.2 11/28/2012 1626   NITRITE NEGATIVE 04/30/2018 1132   LEUKOCYTESUR NEGATIVE 04/30/2018 1132   Sepsis Labs: @LABRCNTIP (procalcitonin:4,lacticidven:4) ) Recent Results (from the past 240 hour(s))  Urine culture     Status: None   Collection Time: 04/22/18 11:33 PM  Result Value Ref Range Status   Specimen Description   Final    URINE, CLEAN CATCH Performed at [facility], [address] [place], [place] [address]    Special Requests   Final    Normal Performed at [facility], [address], [place], [place] [address]    Culture   Final    NO GROWTH Performed at [facility], [place] [address], [place], [place] [address]    Report Status 04/24/2018 FINAL  Final  Blood Culture (routine x 2)     Status: Abnormal   Collection Time: 04/22/18 11:51 PM  Result Value Ref Range Status   Specimen Description   Final    BLOOD LEFT FOREARM Performed at [facility], [address], [place], [place] [address]    Special Requests   Final    BOTTLES DRAWN AEROBIC AND ANAEROBIC Blood Culture adequate volume Performed at [facility], [address] [place], [place] [address]    Culture  Setup Time   Final    GRAM POSITIVE COCCI Gram Stain Report Called to,Read Back By and Verified With: SMITH,J. AT 1657 ON10/30/2019 BY EVA IN BOTH AEROBIC AND ANAEROBIC BOTTLES Performed at [facility], [place] [address] [place], [place] [address]    Culture STAPHYLOCOCCUS SCHLEIFERI (A)  Final   Report Status 04/27/2018 FINAL  Final   Organism ID, Bacteria STAPHYLOCOCCUS SCHLEIFERI  Final      Susceptibility   Staphylococcus schleiferi - MIC*    CIPROFLOXACIN <=0.5 SENSITIVE Sensitive     ERYTHROMYCIN <=0.25 SENSITIVE Sensitive     GENTAMICIN <=0.5 SENSITIVE Sensitive     OXACILLIN <=0.25  SENSITIVE Sensitive     TETRACYCLINE <=1 SENSITIVE Sensitive     VANCOMYCIN 1 SENSITIVE Sensitive     TRIMETH/SULFA <=10 SENSITIVE Sensitive     CLINDAMYCIN <=0.25 SENSITIVE Sensitive     RIFAMPIN <=0.5 SENSITIVE Sensitive     Inducible Clindamycin NEGATIVE Sensitive     * STAPHYLOCOCCUS SCHLEIFERI  Blood Culture (routine x  2)     Status: Abnormal   Collection Time: 04/22/18 11:52 PM  Result Value Ref Range Status   Specimen Description   Final    BLOOD RIGHT HAND Performed at [facility], [address] [place], [place] [address]    Special Requests   Final    BOTTLES DRAWN AEROBIC AND ANAEROBIC Blood Culture adequate volume Performed at [facility], [address] [place], [place] [address]    Culture  Setup Time   Final    GRAM POSITIVE COCCI Gram Stain Report Called to,Read Back By and Verified With: SMITH,J. AT 7121 ON 04/23/2018 BY EVA AEROBIC BOTTLE ONLY Performed at [place] BY AND VERIFIED WITHMiquel Dunn RN [id number] 04/24/18 A BROWNING Performed at [facility], [place] [address], [place], [place] [address]    Culture STAPHYLOCOCCUS SCHLEIFERI (A)  Final   Report Status 04/26/2018 FINAL  Final   Organism ID, Bacteria STAPHYLOCOCCUS SCHLEIFERI  Final      Susceptibility   Staphylococcus schleiferi - MIC*    CIPROFLOXACIN <=0.5 SENSITIVE Sensitive     ERYTHROMYCIN <=0.25 SENSITIVE Sensitive     GENTAMICIN <=0.5 SENSITIVE Sensitive     OXACILLIN <=0.25 SENSITIVE Sensitive     TETRACYCLINE <=1 SENSITIVE Sensitive     VANCOMYCIN <=0.5 SENSITIVE Sensitive     TRIMETH/SULFA <=10 SENSITIVE Sensitive     CLINDAMYCIN <=0.25 SENSITIVE Sensitive     RIFAMPIN <=0.5 SENSITIVE Sensitive     Inducible Clindamycin NEGATIVE Sensitive     * STAPHYLOCOCCUS SCHLEIFERI  Blood Culture ID Panel (Reflexed)     Status: None   Collection Time: 04/22/18 11:52 PM  Result Value Ref Range Status   Enterococcus species NOT DETECTED NOT DETECTED  Final   Listeria monocytogenes NOT DETECTED NOT DETECTED Final   Staphylococcus species NOT DETECTED NOT DETECTED Final   Staphylococcus aureus (BCID) NOT DETECTED NOT DETECTED Final   Streptococcus species NOT DETECTED NOT DETECTED Final   Streptococcus agalactiae NOT DETECTED NOT DETECTED Final   Streptococcus pneumoniae NOT DETECTED NOT DETECTED Final   Streptococcus pyogenes NOT DETECTED NOT DETECTED Final   Acinetobacter baumannii NOT DETECTED NOT DETECTED Final   Enterobacteriaceae species NOT DETECTED NOT DETECTED Final   Enterobacter cloacae complex NOT DETECTED NOT DETECTED Final   Escherichia coli NOT DETECTED NOT DETECTED Final   Klebsiella oxytoca NOT DETECTED NOT DETECTED Final   Klebsiella pneumoniae NOT DETECTED NOT DETECTED Final   Proteus species NOT DETECTED NOT DETECTED Final   Serratia marcescens NOT DETECTED NOT DETECTED Final   Haemophilus influenzae NOT DETECTED NOT DETECTED Final   Neisseria meningitidis NOT DETECTED NOT DETECTED Final   Pseudomonas aeruginosa NOT DETECTED NOT DETECTED Final   Candida albicans NOT DETECTED NOT DETECTED Final   Candida glabrata NOT DETECTED NOT DETECTED Final   Candida krusei NOT DETECTED NOT DETECTED Final   Candida parapsilosis NOT DETECTED NOT DETECTED Final   Candida tropicalis NOT DETECTED NOT DETECTED Final    Comment: Performed at [facility], [place] [address], [place], [place] [address]  MRSA PCR Screening     Status: None   Collection Time: 04/23/18  5:50 PM  Result Value Ref Range Status   MRSA by PCR NEGATIVE NEGATIVE Final    Comment:        The GeneXpert MRSA Assay (FDA approved for NASAL specimens only), is one component of a comprehensive MRSA colonization surveillance program. It is not intended to diagnose  MRSA infection nor to guide or monitor treatment for MRSA infections. Performed at [facility], [address] [place] [address] [place], [place] [address]   Culture, Urine     Status: None   Collection Time:  04/30/18 11:32 AM  Result Value Ref Range Status   Specimen Description   Final    URINE, CLEAN CATCH Performed at [facility], [address], [place], [place] [address]    Special Requests   Final    NONE Performed at [facility], [address], [place], [place] [address]    Culture   Final    NO GROWTH Performed at [facility], [place] [address], [place], [place] [address]    Report Status 05/01/2018 FINAL  Final  Culture, body fluid-bottle     Status: None (Preliminary result)   Collection Time: 05/01/18 12:54 PM  Result Value Ref Range Status   Specimen Description PLEURAL  Final   Special Requests BOTTLES DRAWN AEROBIC AND ANAEROBIC 10 CC EACH  Final   Culture   Final    NO GROWTH < 24 HOURS Performed at [facility], [address], [place], [place] [address]    Report Status PENDING  Incomplete  Gram stain     Status: None   Collection Time: 05/01/18 12:54 PM  Result Value Ref Range Status   Specimen Description PLEURAL  Final   Special Requests NONE  Final   Gram Stain   Final    NO ORGANISMS SEEN CYTOSPIN SMEAR WBC PRESENT, PREDOMINANTLY MONONUCLEAR Performed at [facility], [address] [place], [place] [address]    Report Status 05/01/2018 FINAL  Final     Scheduled Meds: . amLODipine  5 mg Oral Daily  . aspirin  325 mg Oral Q breakfast  . carvedilol  6.25 mg Oral BID WC  . cephALEXin  250 mg Oral Q8H  . feeding supplement (GLUCERNA SHAKE)  237 mL Oral TID BM  . ferrous sulfate  325 mg Oral BID WC  . folic acid  1 mg Oral Daily  . pantoprazole  40 mg Oral BID  . polyethylene glycol  17 g Oral Daily  . senna-docusate  1 tablet Oral BID  . sodium bicarbonate  650 mg Oral TID  . tamsulosin  0.4 mg Oral Daily  . torsemide  20 mg Oral Daily   Continuous Infusions:  Procedures/Studies: Ct Abdomen Pelvis Wo Contrast  Result Date: 04/23/2018 CLINICAL DATA:  84 year old male with abnormal liver function test. Patient was found unresponsive. EXAM:  CT ABDOMEN AND PELVIS WITHOUT CONTRAST TECHNIQUE: Multidetector CT imaging of the abdomen and pelvis was performed following the standard protocol without IV contrast. COMPARISON:  CT of the abdomen pelvis dated 12/16/2012 FINDINGS: Evaluation of this exam is limited in the absence of intravenous contrast. Evaluation is also limited due to streak artifact caused by patient's arms. Lower chest: Partially visualized small right and moderate left pleural effusion with associated partial compressive atelectasis of the lower lobes. Somewhat solid-appearing 18 x 20 mm lesion in the lingula as well as an ill-defined 2.2 x 2.0 cm area in the left lung base (series 2, image 5) are not well evaluated on this noncontrast CT. These may represent areas of consolidative changes of the lungs although lung mass/neoplasm not excluded. Chest CT with contrast may provide better evaluation on nonemergent basis. There is coronary vascular calcification. Small pericardial effusion. There is hypoattenuation of the cardiac blood pool suggestive of a degree of anemia. Clinical correlation is recommended. No  intra-abdominal free air. Diffuse mesenteric stranding. No free fluid. Hepatobiliary: The liver is grossly unremarkable on this noncontrast CT. No intrahepatic biliary ductal dilatation. Small stones within the gallbladder. No pericholecystic fluid. Pancreas: Unremarkable. No pancreatic ductal dilatation or surrounding inflammatory changes. Spleen: Normal in size without focal abnormality. Adrenals/Urinary Tract: The adrenal glands are unremarkable. There is no hydronephrosis or nephrolithiasis on either side. The visualized ureters and urinary bladder appear unremarkable. Stomach/Bowel: There is moderate stool throughout the colon. There is no bowel obstruction or active inflammation. Normal appendix. Vascular/Lymphatic: Advanced aortoiliac atherosclerotic disease. No portal venous gas. There is no adenopathy. Multiple top-normal left  inguinal lymph nodes, possibly reactive. Reproductive: Prostatectomy. Other: Midline vertical anterior abdominal wall incisional scar. Diffuse subcutaneous edema. Musculoskeletal: Osteopenia with degenerative changes of the spine. Old-appearing L3 compression fracture as well as old left femoral fracture status post prior internal fixation. No acute fracture. IMPRESSION: 1. No acute intra-abdominal or pelvic pathology. No bowel obstruction or active inflammation. Normal appendix. 2. Partially visualized small right and moderate left pleural effusion with associated partial compressive atelectasis of the lower lobes. 3. Ill-defined nodular densities in the lingula and left lung base. Chest CT with contrast may provide better evaluation on nonemergent basis. 4. Cholelithiasis. 5. Advanced Aortic Atherosclerosis (ICD10-I70.0). Electronically Signed   By: Anner Crete M.D.   On: 04/23/2018 03:18   Dg Chest 1 View  Result Date: 05/01/2018 CLINICAL DATA:  LEFT pleural effusion post thoracentesis EXAM: CHEST  1 VIEW COMPARISON:  04/30/2018 FINDINGS: Enlargement of cardiac silhouette with slight vascular congestion. Pulmonary edema slightly improved on LEFT. Decreased LEFT pleural effusion and basilar atelectasis post thoracentesis. No pneumothorax. Atherosclerotic calcification at aortic arch. Bones demineralized with advanced RIGHT glenohumeral degenerative changes. IMPRESSION: Decrease in LEFT pleural effusion and basilar atelectasis post thoracentesis without pneumothorax. Electronically Signed   By: Lavonia Dana M.D.   On: 05/01/2018 13:33   Ct Head Wo Contrast  Result Date: 04/23/2018 CLINICAL DATA:  84 year old male with altered mental status. Patient was found unresponsive. EXAM: CT HEAD WITHOUT CONTRAST TECHNIQUE: Contiguous axial images were obtained from the base of the skull through the vertex without intravenous contrast. COMPARISON:  Head CT dated 11/28/2012 FINDINGS: Brain: There is mild to  moderate age-related atrophy and chronic microvascular ischemic changes. Right frontal hypodense area likely subacute or chronic with associated encephalomalacia. Clinical correlation is recommended. There is no acute intracranial hemorrhage. No mass effect or midline shift. No extra-axial fluid collection. Vascular: No hyperdense vessel or unexpected calcification. Skull: Normal. Negative for fracture or focal lesion. Sinuses/Orbits: Mild mucoperiosteal thickening of paranasal sinuses. No air-fluid levels. The mastoid air cells are clear. Other: None IMPRESSION: 1. No acute intracranial hemorrhage. 2. Age-related atrophy and chronic microvascular ischemic changes. Right frontal subacute or old infarct. Clinical correlation is recommended. Electronically Signed   By: Anner Crete M.D.   On: 04/23/2018 03:33   Ct Chest Wo Contrast  Result Date: 04/23/2018 CLINICAL DATA:  84 year old male found unresponsive with hypoglycemia. EXAM: CT CHEST WITHOUT CONTRAST TECHNIQUE: Multidetector CT imaging of the chest was performed following the standard protocol without IV contrast. COMPARISON:  Chest radiograph dated 04/22/2018 and CT of the abdomen pelvis dated 04/23/2018. FINDINGS: Evaluation of this exam is limited in the absence of intravenous contrast. Cardiovascular: There is no cardiomegaly. No significant pericardial effusion. Multi vessel coronary vascular calcification. There is hypoattenuation of the cardiac blood pool suggestive of a degree of anemia. Clinical correlation is recommended. There is moderate atherosclerotic calcification of the thoracic aorta.  The central pulmonary arteries are grossly unremarkable on this noncontrast CT. Mediastinum/Nodes: There is no hilar or mediastinal adenopathy. Evaluation is however limited in the absence of intravenous contrast. The esophagus is grossly unremarkable. Slight heterogeneity of the thyroid gland may be related to nodules. Ultrasound may provide better  evaluation on a non emergent basis. No mediastinal fluid collection. Lungs/Pleura: Moderate-sized bilateral pleural effusions with associated partial compressive atelectasis of the lower lobes. Solid-appearing nodular lesions in the lingula (series 2, image 99 measuring 2.5 x 1.8 cm) and left lower lobe (series 2, image 106) are not well evaluated but may represent consolidative changes of the lungs although solid masses are not excluded. Close follow-up or further evaluation with CT with IV contrast is recommended. There is no pneumothorax. Central airways are patent. Upper Abdomen: Partially visualized gallstones. The visualized upper abdomen is otherwise unremarkable. Musculoskeletal: Diffuse subcutaneous edema and anasarca. Osteopenia with degenerative changes of the spine. No acute osseous pathology. IMPRESSION: 1. Moderate-sized bilateral pleural effusions with associated partial compressive atelectasis of the lower lobes. 2. Consolidative or atelectatic lungs versus solid nodular lesions in the lingula and left lower lobe. Close follow-up or further evaluation with CT with IV contrast is recommended. 3. Coronary vascular calcification andAortic Atherosclerosis (ICD10-I70.0). Electronically Signed   By: Anner Crete M.D.   On: 04/23/2018 06:57   Mr Jodene Nam Head Wo Contrast  Result Date: 05/01/2018 CLINICAL DATA:  Stroke follow-up EXAM: MRA HEAD WITHOUT CONTRAST TECHNIQUE: Angiographic images of the Circle of Willis were obtained using MRA technique without intravenous contrast. COMPARISON:  Brain MRI from yesterday FINDINGS: The left ICA is slightly smaller than the right in the setting of left A1 hypoplasia. High-grade narrowing at the distal right M1 segment. Most of the left V4 segment is nonenhancing, likely from advanced atheromatous disease. The right vertebral and basilar are smooth and widely patent. High-grade narrowing at the proximal right P2 segment. IMPRESSION: 1. Intracranial atherosclerosis  with advanced right M1/2 segment stenosis. 2. Left V4 segment occlusion with distal reconstitution. 3. Advanced right P2 stenosis. Electronically Signed   By: Monte Fantasia M.D.   On: 05/01/2018 11:07   Mr Brain Wo Contrast  Result Date: 04/30/2018 CLINICAL DATA:  84 y/o  M; altered mental status. EXAM: MRI HEAD WITHOUT CONTRAST TECHNIQUE: Multiplanar, multiecho pulse sequences of the brain and surrounding structures were obtained without intravenous contrast. COMPARISON:  04/23/2018 CT head.  04/20/2011 MRI head. FINDINGS: Brain: Severely motion degraded study. Chronic infarct within the right lateral frontal lobe. Reduced diffusion at the posterior margin of the right lateral frontal chronic infarction compatible with new or recrudescence acute/early subacute infarction. Additionally, there is a punctate focus of reduced diffusion in the right occipital lobe compatible with a small vessel acute/early subacute infarction. No associated mass effect or gross hemorrhage. No herniation. Vascular: Normal flow voids. Skull and upper cervical spine: Normal marrow signal. Sinuses/Orbits: Negative. Other: None. IMPRESSION: 1. Severe motion artifact. 2. Chronic right lateral frontal lobe infarction. Reduced diffusion at the posterior margin of the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

## 2018-05-03 DIAGNOSIS — Z7189 Other specified counseling: Secondary | ICD-10-CM

## 2018-05-03 DIAGNOSIS — I639 Cerebral infarction, unspecified: Secondary | ICD-10-CM

## 2018-05-03 LAB — GLUCOSE, CAPILLARY
GLUCOSE-CAPILLARY: 100 mg/dL — AB (ref 70–99)
GLUCOSE-CAPILLARY: 103 mg/dL — AB (ref 70–99)
Glucose-Capillary: 113 mg/dL — ABNORMAL HIGH (ref 70–99)
Glucose-Capillary: 113 mg/dL — ABNORMAL HIGH (ref 70–99)
Glucose-Capillary: 119 mg/dL — ABNORMAL HIGH (ref 70–99)
Glucose-Capillary: 158 mg/dL — ABNORMAL HIGH (ref 70–99)
Glucose-Capillary: 99 mg/dL (ref 70–99)

## 2018-05-03 MED ORDER — SODIUM BICARBONATE 650 MG PO TABS: 650.0000 mg | ORAL_TABLET | Freq: Three times a day (TID) | ORAL | 1 refills | Status: DC

## 2018-05-03 MED ORDER — GLUCERNA SHAKE PO LIQD: 237.0000 mL | Freq: Three times a day (TID) | ORAL | 0 refills | Status: AC

## 2018-05-03 MED ORDER — TORSEMIDE 20 MG PO TABS: 20.0000 mg | ORAL_TABLET | Freq: Every day | ORAL | 1 refills | Status: DC

## 2018-05-03 MED ORDER — CARVEDILOL 6.25 MG PO TABS: 6.2500 mg | ORAL_TABLET | Freq: Two times a day (BID) | ORAL | 1 refills | Status: DC

## 2018-05-03 MED ORDER — ASPIRIN 325 MG PO TBEC: 325.0000 mg | DELAYED_RELEASE_TABLET | Freq: Every day | ORAL | 0 refills | Status: AC

## 2018-05-03 MED ORDER — FERROUS SULFATE 325 (65 FE) MG PO TABS: 325.0000 mg | ORAL_TABLET | Freq: Two times a day (BID) | ORAL | 0 refills | Status: AC

## 2018-05-03 MED ORDER — CEPHALEXIN 250 MG PO CAPS: 250.0000 mg | ORAL_CAPSULE | Freq: Three times a day (TID) | ORAL | 0 refills | Status: DC

## 2018-05-03 MED ORDER — AMLODIPINE BESYLATE 5 MG PO TABS: 5.0000 mg | ORAL_TABLET | Freq: Every day | ORAL | 0 refills | Status: DC

## 2018-05-03 NOTE — Progress Notes (Signed)
Subjective: Interval History: Patient offers no complaints.  Severe hearing problem  Objective: Vital signs in last 24 hours: Temp:  [97.8 F (36.6 C)-98.5 F (36.9 C)] 97.8 F (36.6 C) (11/09 0600) Pulse Rate:  [72-78] 72 (11/09 0600) Resp:  [15-24] 15 (11/09 0600) BP: (96-128)/(53-79) 128/53 (11/09 0600) SpO2:  [93 %-96 %] 93 % (11/09 0600) Weight:  [71.8 kg] 71.8 kg (11/09 0500) Weight change: -0.5 kg  Intake/Output from previous day: 11/08 0701 - 11/09 0700 In: 480 [P.O.:480] Out: 2550 [Urine:2550] Intake/Output this shift: No intake/output data recorded.  Generally patient is alert.  He does not seem to be in any apparent distress. Chest: He has some inspiratory crackles Heart exam revealed regular rate and rhythm Extremities he has no  edema  Lab Results: Recent Labs    05/01/18 0445 05/02/18 0559  WBC 7.3 6.7  HGB 7.9* 9.0*  HCT 26.4* 29.3*  PLT 222 216   BMET:  Recent Labs    05/01/18 0445 05/02/18 0559  NA 139 136  137  K 4.7 4.6  4.7  CL 109 102  104  CO2 21* 23  24  GLUCOSE 110* 104*  105*  BUN 88* 104*  105*  CREATININE 3.32* 3.29*  3.34*  CALCIUM 8.2* 8.3*  8.4*   No results for input(s): PTH in the last 72 hours. Iron Studies: No results for input(s): IRON, TIBC, TRANSFERRIN, FERRITIN in the last 72 hours.  Studies/Results: Dg Chest 1 View  Result Date: 05/01/2018 CLINICAL DATA:  LEFT pleural effusion post thoracentesis EXAM: CHEST  1 VIEW COMPARISON:  04/30/2018 FINDINGS: Enlargement of cardiac silhouette with slight vascular congestion. Pulmonary edema slightly improved on LEFT. Decreased LEFT pleural effusion and basilar atelectasis post thoracentesis. No pneumothorax. Atherosclerotic calcification at aortic arch. Bones demineralized with advanced RIGHT glenohumeral degenerative changes. IMPRESSION: Decrease in LEFT pleural effusion and basilar atelectasis post thoracentesis without pneumothorax. Electronically Signed   By: Lavonia Dana M.D.   On: 05/01/2018 13:33   Mr Jodene Nam Head Wo Contrast  Result Date: 05/01/2018 CLINICAL DATA:  Stroke follow-up EXAM: MRA HEAD WITHOUT CONTRAST TECHNIQUE: Angiographic images of the Circle of Willis were obtained using MRA technique without intravenous contrast. COMPARISON:  Brain MRI from yesterday FINDINGS: The left ICA is slightly smaller than the right in the setting of left A1 hypoplasia. High-grade narrowing at the distal right M1 segment. Most of the left V4 segment is nonenhancing, likely from advanced atheromatous disease. The right vertebral and basilar are smooth and widely patent. High-grade narrowing at the proximal right P2 segment. IMPRESSION: 1. Intracranial atherosclerosis with advanced right M1/2 segment stenosis. 2. Left V4 segment occlusion with distal reconstitution. 3. Advanced right P2 stenosis. Electronically Signed   By: Monte Fantasia M.D.   On: 05/01/2018 11:07   US Carotid Bilateral  Result Date: 05/01/2018 CLINICAL DATA:  Stroke follow-up EXAM: BILATERAL CAROTID DUPLEX ULTRASOUND TECHNIQUE: Pearline Cables scale imaging, color Doppler and duplex ultrasound were performed of bilateral carotid and vertebral arteries in the neck. COMPARISON:  None. FINDINGS: Criteria: Quantification of carotid stenosis is based on velocity parameters that correlate the residual internal carotid diameter with NASCET-based stenosis levels, using the diameter of the distal internal carotid lumen as the denominator for stenosis measurement. The following velocity measurements were obtained: RIGHT ICA: 152 cm/sec CCA: 78 cm/sec SYSTOLIC ICA/CCA RATIO:  1.9 ECA: 124 cm/sec LEFT ICA: 106 cm/sec CCA: 69 cm/sec SYSTOLIC ICA/CCA RATIO:  1.5 ECA: 132 cm/sec RIGHT CAROTID ARTERY: Moderate calcified plaque in the bulb  with shadowing of the lumen. Low resistance internal carotid Doppler pattern is preserved. RIGHT VERTEBRAL ARTERY:  Antegrade. LEFT CAROTID ARTERY: There is scattered mixed plaque throughout the left  common carotid artery. This continues into the bulb. No obvious significant luminal narrowing is identified. LEFT VERTEBRAL ARTERY:  Nonvisualized. IMPRESSION: 50-69% stenosis in the right internal carotid artery. Less than 50% stenosis in the left internal carotid artery. The left vertebral artery cannot be identified.  It may be occluded. The right vertebral artery is antegrade in flow. Electronically Signed   By: Marybelle Killings M.D.   On: 05/01/2018 13:02   US Thoracentesis Asp Pleural Space W/img Guide  Result Date: 05/01/2018 INDICATION: LEFT pleural effusion EXAM: ULTRASOUND GUIDED DIAGNOSTIC AND THERAPEUTIC LEFT THORACENTESIS MEDICATIONS: None. COMPLICATIONS: None immediate. PROCEDURE: Written informed consent for procedure was obtained from the patient's son. Time out protocol followed. Pleural effusion localized by ultrasound at the posterior LEFT hemithorax. Skin prepped and draped in usual sterile fashion. Skin and soft tissues anesthetized with 10 mL of 1% lidocaine. 8 French thoracentesis catheter placed into the LEFT pleural space. 700 mL of clear yellow fluid aspirated by syringe pump. Procedure tolerated well by patient without immediate complication. FINDINGS: A total of approximately 700 mL of LEFT pleural fluid was removed. Samples were sent to the laboratory as requested by the clinical team. IMPRESSION: Successful ultrasound guided LEFT thoracentesis yielding 700 mL of pleural fluid. Electronically Signed   By: Lavonia Dana M.D.   On: 05/01/2018 13:36    I have reviewed the patient's current medications.  Assessment/Plan: 1] possibly acute kidney injury superimposed on chronic.  His renal function overall is stable.  Presently patient is a symptomatic. 2] fluid management: Patient on Demadex orally.  Patient has 2500 cc of urine output.  No significant sign of fluid overload. 3] low CO2: Possibly metabolic acidosis.  His CO2 is normal.  Patient is on sodium bicarbonate.  4] bone and  mineral disorder: His calcium and phosphorus is range.  Diet controlled. 5] hypertension: His blood pressure is reasonably controlled 6] history of diabetes : His blood sugar is good. 7] anemia: Anemia secondary to chronic renal failure.  Patient is on Aranesp once a week.  His hemoglobin is somewhat better. Plan: 1] we will continue his present management 2] we will check his renal panel and CBC in the morning. 3] we will see patient in 3 weeks when he is going to be discharged.    LOS: 10 days   Danaka Llera S 05/03/2018,8:56 AM

## 2018-05-03 NOTE — Progress Notes (Signed)
Attempted to contact pt son and friend Tammy about pt discharge home via EMS. At this point I am unaware if there is family at home to meet EMS so I will wait to send him until I know that there is family at the home to meet them.   Will continue to attempt to contact family and friend.

## 2018-05-03 NOTE — Care Management Note (Signed)
Case Management Note  Patient Details  Name: [name] MRN: [id number] Date of Birth: Jan 03, 1927  Subjective/Objective:   Patient to be discharged per MD order. Orders in place for home health services. Spoke with daughter at length since patient is confused. Daughter very upset about discharging situation yet she has refused SNF. I also spoke with the discharging MD and primary RN and they tell me the daughter has been quite verbally aggressive and refuses discharge although she has been warned for multiple days that the discharge was pending and she avoids answering the phone and avoids being at home so EMS cannot bring patient to the home. After a lengthy conversation her main concern is a hospital bed, having a condom catheter and home health. HH is set up via [facility]. RNCM has ordered a hospital bed via [facility]. MD is ok with patient leaving with condom catheter and [facility] [facility] RN can help maintain the care for this. I have informed daughter that patient has been medically cleared and she runs the risk of non coverage for the continued hospitalization as patient is cleared for discharge.                   Action/Plan:   Expected Discharge Date:  05/03/18               Expected Discharge Plan:  [place]  In-House Referral:     Discharge planning Services  CM Consult  Post Acute Care Choice:  Durable Medical Equipment, Home Health Choice offered to:  Patient, Adult Children  DME Arranged:  Hospital bed DME Agency:  [place]:  RN, PT, Nurse's Aide, Speech Therapy, Social Work [employer] Agency:  [facility] [employer] [facility] [employer]  Status of Service:  Completed, signed off  If discussed at [employer], dates discussed:    Additional Comments:  Latanya Maudlin, RN 05/03/2018, 2:55 PM

## 2018-05-03 NOTE — Care Management (Signed)
Patient has significant mobility limitations and spends almost 100% of time time in bed which can cause choking during meals as well as increased risk for skin breakdown. Patient requires frequent turning and repositioning. Torso required to be elevated at least 45 degrees for meals to prevent aspiration. Bed wedges do not provide adequate elevation to resolve issues with aspiration. Bed wedges do not provide adequate elevation to resolve issues with aspiration. Gagging/choking requires frequent changes in body position which cannot be achieved with a normal bed.

## 2018-05-03 NOTE — Discharge Summary (Signed)
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 Pharyngeal- Thin Teaspoon Delayed swallow initiation-pyriform sinuses;Pharyngeal residue - valleculae Pharyngeal -- Pharyngeal- Thin Cup Delayed swallow initiation-pyriform sinuses;Pharyngeal residue - valleculae Pharyngeal -- Pharyngeal- Thin Straw Pharyngeal residue - valleculae;Delayed swallow initiation-pyriform  sinuses;Penetration/Aspiration during swallow Pharyngeal Material enters airway, CONTACTS cords and then ejected out Pharyngeal- Puree Delayed swallow initiation-vallecula Pharyngeal -- Pharyngeal- Mechanical Soft Delayed swallow initiation-vallecula Pharyngeal --  Pharyngeal- Regular -- Pharyngeal -- Pharyngeal- Multi-consistency -- Pharyngeal -- Pharyngeal- Pill Delayed swallow initiation-pyriform sinuses;Pharyngeal residue - valleculae Pharyngeal -- Pharyngeal Comment --  CH[facility] IP CERVICAL ESOPHAGEAL PHASE 04/25/2018 Cervical Esophageal Phase Impaired Pudding Teaspoon -- Pudding Cup -- Honey Teaspoon -- Honey Cup -- Nectar Teaspoon -- Nectar Cup -- Nectar Straw -- Thin Teaspoon -- Thin Cup -- Thin Straw (No Data) Puree -- Mechanical Soft -- Regular -- Multi-consistency -- Pill -- Cervical Esophageal Comment -- Amelia H. Roddie Mc, CCC-SLP Speech Language Pathologist Wende Bushy 04/25/2018, 5:41 PM              US Thoracentesis Asp Pleural Space W/img Guide  Result Date: 05/01/2018 INDICATION: LEFT pleural effusion EXAM: ULTRASOUND GUIDED DIAGNOSTIC AND THERAPEUTIC LEFT THORACENTESIS MEDICATIONS: None. COMPLICATIONS: None immediate. PROCEDURE: Written informed consent for procedure was obtained from the patient's son. Time out protocol followed. Pleural effusion localized by ultrasound at the posterior LEFT hemithorax. Skin prepped and draped in usual sterile fashion. Skin and soft tissues anesthetized with 10 mL of 1% lidocaine. 8 French thoracentesis catheter placed into the LEFT pleural space. 700 mL of clear yellow fluid aspirated by syringe pump. Procedure tolerated well by patient without immediate complication. FINDINGS: A total of approximately 700 mL of LEFT pleural fluid was removed. Samples were sent to the laboratory as requested by the clinical team. IMPRESSION: Successful ultrasound guided LEFT thoracentesis yielding 700 mL of pleural fluid. Electronically Signed   By: Lavonia Dana M.D.   On:  05/01/2018 13:36     Microbiology: Recent Results (from the past 240 hour(s))  MRSA PCR Screening     Status: None   Collection Time: 04/23/18  5:50 PM  Result Value Ref Range Status   MRSA by PCR NEGATIVE NEGATIVE Final    Comment:        The GeneXpert MRSA Assay (FDA approved for NASAL specimens only), is one component of a comprehensive MRSA colonization surveillance program. It is not intended to diagnose MRSA infection nor to guide or monitor treatment for MRSA infections. Performed at [facility], [address] [place], [place] [address]   Culture, Urine     Status: None   Collection Time: 04/30/18 11:32 AM  Result Value Ref Range Status   Specimen Description   Final    URINE, CLEAN CATCH Performed at [facility], [address] [place], [place] [address]    Special Requests   Final    NONE Performed at [facility], [address] [place], [place] [address]    Culture   Final    NO GROWTH Performed at [facility], [place] [address], [place], [place] [address]    Report Status 05/01/2018 FINAL  Final  Culture, body fluid-bottle     Status: None (Preliminary result)   Collection Time: 05/01/18 12:54 PM  Result Value Ref Range Status   Specimen Description PLEURAL  Final   Special Requests BOTTLES DRAWN AEROBIC AND ANAEROBIC 10 CC EACH  Final   Culture   Final    NO GROWTH 2 DAYS Performed at [facility], [address], [place], [place] [address]    Report Status PENDING  Incomplete  Gram stain     Status: None   Collection Time: 05/01/18 12:54 PM  Result Value Ref Range Status   Specimen Description PLEURAL  Final   Special Requests NONE  Final   Gram Stain   Final    NO ORGANISMS SEEN CYTOSPIN SMEAR WBC PRESENT, PREDOMINANTLY  MONONUCLEAR Performed at [facility], [address], [place], [place] [address]    Report Status 05/01/2018 FINAL  Final     Labs: Basic Metabolic Panel: Recent Labs  Lab 04/26/18 1844  04/28/18 0539  04/29/18 0434 04/30/18 0458 05/01/18 0445 05/02/18 0559  NA  --    < > 138 139  140 139 139 136  137  K  --    < > 4.8 4.7  4.6 4.7 4.7 4.6  4.7  CL  --    < > 115* 114*  114* 111 109 102  104  CO2  --    < > 16* 16*  17* 17* 21* 23  24  GLUCOSE  --    < > 120* 108*  109* 82 110* 104*  105*  BUN  --    < > 67* 77*  78* 81* 88* 104*  105*  CREATININE  --    < > 3.38*  3.28* 3.38*  3.39* 3.32* 3.32* 3.29*  3.34*  CALCIUM  --    < > 8.0* 8.2*  8.0* 7.9* 8.2* 8.3*  8.4*  MG 1.6*  --   --   --   --   --   --   PHOS  --    < > 5.0* 5.1*  5.2* 4.8* 4.9* 4.9*   < > = values in this interval not displayed.   Liver Function Tests: Recent Labs  Lab 04/28/18 0539 04/29/18 0434 04/30/18 0458 05/01/18 0445 05/02/18 0559  AST  --   --   --   --  13*  ALT  --   --   --   --  13  ALKPHOS  --   --   --   --  68  BILITOT  --   --   --   --  0.4  PROT  --   --   --   --  6.0*  ALBUMIN 2.2* 2.1*  2.2* 2.1* 2.2* 2.3*  2.3*   No results for input(s): LIPASE, AMYLASE in the last 168 hours. Recent Labs  Lab 04/30/18 1409  AMMONIA 22   CBC: Recent Labs  Lab 04/27/18 0637 05/01/18 0445 05/02/18 0559  WBC 8.5 7.3 6.7  HGB 9.2* 7.9* 9.0*  HCT 31.0* 26.4* 29.3*  MCV 96.9 100.0 95.4  PLT 170 222 216   Cardiac Enzymes: Recent Labs  Lab 04/30/18 1409  CKTOTAL 58   BNP: Invalid input(s): POCBNP CBG: Recent Labs  Lab 05/02/18 1633 05/02/18 2003 05/02/18 2354 05/03/18 0401 05/03/18 0725  GLUCAP 113* 173* 113* 100* 103*    Time coordinating discharge:  36 minutes  Signed:  Orson Eva, DO [facility] Pager: [phone] 05/03/2018, 10:47 AM

## 2018-05-04 LAB — CBC
HCT: 28.6 % — ABNORMAL LOW (ref 39.0–52.0)
Hemoglobin: 8.7 g/dL — ABNORMAL LOW (ref 13.0–17.0)
MCH: 29.5 pg (ref 26.0–34.0)
MCHC: 30.4 g/dL (ref 30.0–36.0)
MCV: 96.9 fL (ref 80.0–100.0)
NRBC: 0 % (ref 0.0–0.2)
PLATELETS: 225 10*3/uL (ref 150–400)
RBC: 2.95 MIL/uL — AB (ref 4.22–5.81)
RDW: 14.9 % (ref 11.5–15.5)
WBC: 6.4 10*3/uL (ref 4.0–10.5)

## 2018-05-04 LAB — GLUCOSE, CAPILLARY
GLUCOSE-CAPILLARY: 104 mg/dL — AB (ref 70–99)
GLUCOSE-CAPILLARY: 102 mg/dL — AB (ref 70–99)

## 2018-05-04 LAB — RENAL FUNCTION PANEL
ANION GAP: 10 (ref 5–15)
Albumin: 2.3 g/dL — ABNORMAL LOW (ref 3.5–5.0)
BUN: 99 mg/dL — ABNORMAL HIGH (ref 8–23)
CO2: 27 mmol/L (ref 22–32)
Calcium: 8.6 mg/dL — ABNORMAL LOW (ref 8.9–10.3)
Chloride: 104 mmol/L (ref 98–111)
Creatinine, Ser: 2.87 mg/dL — ABNORMAL HIGH (ref 0.61–1.24)
GFR, EST AFRICAN AMERICAN: 21 mL/min — AB (ref >60)
GFR, EST NON AFRICAN AMERICAN: 18 mL/min — AB (ref >60)
Glucose, Bld: 129 mg/dL — ABNORMAL HIGH (ref 70–99)
Phosphorus: 4.7 mg/dL — ABNORMAL HIGH (ref 2.5–4.6)
Potassium: 4.4 mmol/L (ref 3.5–5.1)
SODIUM: 141 mmol/L (ref 135–145)

## 2018-05-04 MED ORDER — SODIUM BICARBONATE 650 MG PO TABS: 650.0000 mg | ORAL_TABLET | Freq: Two times a day (BID) | ORAL | Status: DC

## 2018-05-04 NOTE — Progress Notes (Signed)
PROGRESS NOTE  [name] JJO:[id number] DOB: July 15, 1926 DOA: 04/22/2018 PCP: Lemmie Evens, MD  Brief History:  84 y/o male with history of CKD 4, DM2, and dementia presented on 10/29 when he was found unresponsive. Initial CBG was read "low". The patient was Luis oral glucagon. When he arrived in the ED his CBG was 78. When I asked the patient how he is doing he states "I feel fine". However he is unable to answer any kind of detailed questions such as where he is located in [place] going on. The patient fractured his femur in July 2019. It was surgically repaired by Dr. Ninfa Linden on 12/28/2017, and the patient went to skilled nursing facility on 01/03/2018. Since that period of time, the patient has had a functional decline to the point where he is essentially bedbound at this point. On the evening prior to admission, the patient was noted to be confused and has had decreased oral intake. Since admission, the patient was treated for sepsis secondary to aspiration pneumonitis. He was noted to have acute on chronic renal failure. Nephrology was consulted to assist with management. The patient was noted to have fluid overload and was started on intravenous furosemide. He was subsequently transitioned to torsemide. His renal function stabilized. The patient was treated with intravenous antibiotics for his pneumonia and bacteremia. He was subsequently transitioned to oral cephalexin. MRI of the brain showed that the patient had a subacute stroke. Neurology did not feel the patient was a candidate for aggressive management. They recommended starting aspirin and optimizing risk factor management. Subsequently, the patient was medically optimized and was stable to be discharged.  There was some difficult decision making on the part of the family regarding his ultimate disposition to a skilled nursing facility versus going home.  Ultimately, the patient's family decided to  take the patient home.  Case management was consulted to assist with obtaining durable medical equipment which has been confirmed.  Assessment/Plan: Sepsis -Present at the time of admission -Secondary to aspiration pneumoniaand bacteremia -Patient finished 5 days of antibiotics -Sepsis physiology resolved  Acute metabolic encephalopathy -Multifactorial including infectious process, dehydration, progression of renal failure,strokeand hypoglycemia -Patient is alert oriented x2 -TSH 0.662 -B12--1647 -check ZSWFUXN--23 -check folic FTDD--22.0 -MR brain--early subacute R-frontal stroke; punctate early subacute stroke R-occipital -UA--neg for pyruia -11/8--pt is more alert and eating better; with subsequent days, his mental status continued to improve.  Staphylococcus shleiferi Bacteremia -initially on ceftriaxone-->cephalexin x 7 days -d/c home with 3 more days cephalexin to complete 1 week  Anasarca/pleural effusions -due to progression of CKD and hypoalbuminemia -continue torsemide 60 mg dailyand metolazone -repeat CXR--personally rleviewed--pulmonary edema and L>R pleural effusion -04/23/2018 echo EF 60-65%, no WMA, small pericardial effusion, mild TR, PASP 34 -Check urine protein/creatinine ratio--0.53 -11/7--thoracocentesis--700 cc removed -now stable on RA since thoracocentesis -pleural fluid consistent with parapneumonic effusion but suspect there is contribution from pt's anasarca and hypoalbuminemia  Acute on chronic renal failure--CKD stage 4 -previous baseline 2.3-2.6 -now has new renal baseline 3.1-3.4 -continue torsemide per renal -11/8--case discussed with renal, Dr. Lavena Bullion for d/c -serum creatinine 2.87 at time of discharge  Acute/Subacute ischemic stroke -MRI brain--as above -MRA brain--advanced R- M2 segment stenosis and R-P2 stenosis -carotid US--Right--50-69% stenosis, L--no hemodynamically significant stenosis -LDL  55 -A1C--pending -PT-->SNF, family refuses -speech eval-->dyshagia 2 diet -continue ASA -11/7--case discussed with neurology--Dr. Arora-->pt is not a candidate for aggressive/invasive intervention-->continue ASA--set up 30 day event monitor after d/c  Diabetes mellitus type 2 with nephropathy -12/30/2017 hemoglobin A 1 C--5.5 -Patient has had intermittent hypoglycemia secondary to poor oral intake -Discontinue NovoLog sliding scale -Continue intermittent CBG checks  Essential hypertension -Continue amlodipine, carvedilol  Dementia without behavioral disturbance -Continue supportive care  Anemia of CKD -Baseline hemoglobin 8-9 -Patient was transfused 2 units PRBC during this admission -Iron saturation 4%, ferritin 838 -Serum S06 3016 -Folic acid 01.0  Metabolic acidosis -Secondary to CKD stage IV -Continue bicarbonate per nephrology  Goals of Care -palliative medicine consulted--case discussed with Vinie Sill -pt is now DNR -son refuses SNF--pt will d/c home with [facility]        Disposition Plan:  D/c home 11/10 Family Communication:  No Family at bedside  Consultants:  renal  Code Status:  FULL   DVT Prophylaxis:  SCDs  Procedures: As Listed in Progress Note Above  Antibiotics: [name]     Subjective: Patient is alert and awake.  He has been eating well with assistance.  He denies any chest pain or shortness of breath.  Remainder review of systems are detailed second the patient's encephalopathy and cognitive impairment.  Patient states "I feel good"  Objective: Vitals:   05/03/18 2101 05/03/18 2103 05/04/18 0532 05/04/18 0550  BP: (!) 126/50  (!) 138/54   Pulse: (!) 34 72 71   Resp: 20  20   Temp: 98.9 F (37.2 C)  98.5 F (36.9 C)   TempSrc: Oral  Oral   SpO2: 94% 94% 93%   Weight:    71.4 kg  Height:        Intake/Output Summary (Last 24 hours) at 05/04/2018 1000 Last data filed at 05/04/2018 0900 Gross per 24 hour  Intake 720 ml   Output 1900 ml  Net -1180 ml   Weight change: -4.2 kg Exam:   General:  Pt is alert, follows commands appropriately, not in acute distress  HEENT: No icterus, No thrush, No neck mass, [place]/AT  Cardiovascular: RRR, S1/S2, no rubs, no gallops  Respiratory: Bibasilar crackles but no wheezing  Abdomen: Soft/+BS, non tender, non distended, no guarding  Extremities: No edema, No lymphangitis, No petechiae, No rashes, no synovitis   Data Reviewed: I have personally reviewed following labs and imaging studies Basic Metabolic Panel: Recent Labs  Lab 04/29/18 0434 04/30/18 0458 05/01/18 0445 05/02/18 0559 05/04/18 0644  NA 139  140 139 139 136  137 141  K 4.7  4.6 4.7 4.7 4.6  4.7 4.4  CL 114*  114* 111 109 102  104 104  CO2 16*  17* 17* 21* 23  24 27   GLUCOSE 108*  109* 82 110* 104*  105* 129*  BUN 77*  78* 81* 88* 104*  105* 99*  CREATININE 3.38*  3.39* 3.32* 3.32* 3.29*  3.34* 2.87*  CALCIUM 8.2*  8.0* 7.9* 8.2* 8.3*  8.4* 8.6*  PHOS 5.1*  5.2* 4.8* 4.9* 4.9* 4.7*   Liver Function Tests: Recent Labs  Lab 04/29/18 0434 04/30/18 0458 05/01/18 0445 05/02/18 0559 05/04/18 0644  AST  --   --   --  13*  --   ALT  --   --   --  13  --   ALKPHOS  --   --   --  68  --   BILITOT  --   --   --  0.4  --   PROT  --   --   --  6.0*  --   ALBUMIN 2.1*  2.2* 2.1* 2.2* 2.3*  2.3* 2.3*   No results for input(s): LIPASE, AMYLASE in the last 168 hours. Recent Labs  Lab 04/30/18 1409  AMMONIA 22   Coagulation Profile: No results for input(s): INR, PROTIME in the last 168 hours. CBC: Recent Labs  Lab 05/01/18 0445 05/02/18 0559 05/04/18 0644  WBC 7.3 6.7 6.4  HGB 7.9* 9.0* 8.7*  HCT 26.4* 29.3* 28.6*  MCV 100.0 95.4 96.9  PLT 222 216 225   Cardiac Enzymes: Recent Labs  Lab 04/30/18 1409  CKTOTAL 58   BNP: Invalid input(s): POCBNP CBG: Recent Labs  Lab 05/03/18 1640 05/03/18 2011 05/03/18 2324 05/04/18 0434 05/04/18 0753  GLUCAP 99 158*  119* 104* 102*   HbA1C: Recent Labs    05/01/18 1004  HGBA1C 5.1   Urine analysis:    Component Value Date/Time   COLORURINE STRAW (A) 04/30/2018 1132   APPEARANCEUR CLEAR 04/30/2018 1132   LABSPEC 1.015 04/30/2018 1132   PHURINE 5.5 04/30/2018 1132   GLUCOSEU NEGATIVE 04/30/2018 1132   HGBUR NEGATIVE 04/30/2018 1132   BILIRUBINUR NEGATIVE 04/30/2018 1132   KETONESUR NEGATIVE 04/30/2018 1132   PROTEINUR NEGATIVE 04/30/2018 1132   UROBILINOGEN 0.2 11/28/2012 1626   NITRITE NEGATIVE 04/30/2018 1132   LEUKOCYTESUR NEGATIVE 04/30/2018 1132   Sepsis Labs: @LABRCNTIP (procalcitonin:4,lacticidven:4) ) Recent Results (from the past 240 hour(s))  Culture, Urine     Status: [name]   Collection Time: 04/30/18 11:32 AM  Result Value Ref Range Status   Specimen Description   Final    URINE, CLEAN CATCH Performed at [facility], [address] [place], [place] [address]    Special Requests   Final    [name] Performed at [facility], [address] [place], [place] [address]    Culture   Final    NO GROWTH Performed at [facility], [place] [address] [place], [place] [address]    Report Status 05/01/2018 FINAL  Final  Culture, body fluid-bottle     Status: [name] (Preliminary result)   Collection Time: 05/01/18 12:54 PM  Result Value Ref Range Status   Specimen Description PLEURAL  Final   Special Requests BOTTLES DRAWN AEROBIC AND ANAEROBIC 10 CC EACH  Final   Culture   Final    NO GROWTH 2 DAYS Performed at [facility], [address] [place], [place] [address]    Report Status PENDING  Incomplete  Gram stain     Status: [name]   Collection Time: 05/01/18 12:54 PM  Result Value Ref Range Status   Specimen Description PLEURAL  Final   Special Requests [name]  Final   Gram Stain   Final    NO ORGANISMS SEEN CYTOSPIN SMEAR WBC PRESENT, PREDOMINANTLY MONONUCLEAR Performed at [facility], [address], [place],  [address]    Report Status 05/01/2018 FINAL  Final      Scheduled Meds: . amLODipine  5 mg Oral Daily  . aspirin  325 mg Oral Q breakfast  . carvedilol  6.25 mg Oral BID WC  . cephALEXin  250 mg Oral Q8H  . feeding supplement (GLUCERNA SHAKE)  237 mL Oral TID BM  . ferrous sulfate  325 mg Oral BID WC  . folic acid  1 mg Oral Daily  . pantoprazole  40 mg Oral BID  . polyethylene glycol  17 g Oral Daily  . senna-docusate  1 tablet Oral BID  . sodium bicarbonate  650 mg Oral BID  . tamsulosin  0.4 mg Oral Daily  . torsemide  20  mg Oral Daily   Continuous Infusions:  Procedures/Studies: Ct Abdomen Pelvis Wo Contrast  Result Date: 04/23/2018 CLINICAL DATA:  84 year old male with abnormal liver function test. Patient was found unresponsive. EXAM: CT ABDOMEN AND PELVIS WITHOUT CONTRAST TECHNIQUE: Multidetector CT imaging of the abdomen and pelvis was performed following the standard protocol without IV contrast. COMPARISON:  CT of the abdomen pelvis dated 12/16/2012 FINDINGS: Evaluation of this exam is limited in the absence of intravenous contrast. Evaluation is also limited due to streak artifact caused by patient's arms. Lower chest: Partially visualized small right and moderate left pleural effusion with associated partial compressive atelectasis of the lower lobes. Somewhat solid-appearing 18 x 20 mm lesion in the lingula as well as an ill-defined 2.2 x 2.0 cm area in the left lung base (series 2, image 5) are not well evaluated on this noncontrast CT. These may represent areas of consolidative changes of the lungs although lung mass/neoplasm not excluded. Chest CT with contrast may provide better evaluation on nonemergent basis. There is coronary vascular calcification. Small pericardial effusion. There is hypoattenuation of the cardiac blood pool suggestive of a degree of anemia. Clinical correlation is recommended. No intra-abdominal free air. Diffuse mesenteric stranding. No free fluid. Hepatobiliary: The liver is grossly unremarkable on this  noncontrast CT. No intrahepatic biliary ductal dilatation. Small stones within the gallbladder. No pericholecystic fluid. Pancreas: Unremarkable. No pancreatic ductal dilatation or surrounding inflammatory changes. Spleen: Normal in size without focal abnormality. Adrenals/Urinary Tract: The adrenal glands are unremarkable. There is no hydronephrosis or nephrolithiasis on either side. The visualized ureters and urinary bladder appear unremarkable. Stomach/Bowel: There is moderate stool throughout the colon. There is no bowel obstruction or active inflammation. Normal appendix. Vascular/Lymphatic: Advanced aortoiliac atherosclerotic disease. No portal venous gas. There is no adenopathy. Multiple top-normal left inguinal lymph nodes, possibly reactive. Reproductive: Prostatectomy. Other: Midline vertical anterior abdominal wall incisional scar. Diffuse subcutaneous edema. Musculoskeletal: Osteopenia with degenerative changes of the spine. Old-appearing L3 compression fracture as well as old left femoral fracture status post prior internal fixation. No acute fracture. IMPRESSION: 1. No acute intra-abdominal or pelvic pathology. No bowel obstruction or active inflammation. Normal appendix. 2. Partially visualized small right and moderate left pleural effusion with associated partial compressive atelectasis of the lower lobes. 3. Ill-defined nodular densities in the lingula and left lung base. Chest CT with contrast may provide better evaluation on nonemergent basis. 4. Cholelithiasis. 5. Advanced Aortic Atherosclerosis (ICD10-I70.0). Electronically Signed   By: Anner Crete M.D.   On: 04/23/2018 03:18   Dg Chest 1 View  Result Date: 05/01/2018 CLINICAL DATA:  LEFT pleural effusion post thoracentesis EXAM: CHEST  1 VIEW COMPARISON:  04/30/2018 FINDINGS: Enlargement of cardiac silhouette with slight vascular congestion. Pulmonary edema slightly improved on LEFT. Decreased LEFT pleural effusion and basilar  atelectasis post thoracentesis. No pneumothorax. Atherosclerotic calcification at aortic arch. Bones demineralized with advanced RIGHT glenohumeral degenerative changes. IMPRESSION: Decrease in LEFT pleural effusion and basilar atelectasis post thoracentesis without pneumothorax. Electronically Signed   By: Lavonia Dana M.D.   On: 05/01/2018 13:33   Ct Head Wo Contrast  Result Date: 04/23/2018 CLINICAL DATA:  84 year old male with altered mental status. Patient was found unresponsive. EXAM: CT HEAD WITHOUT CONTRAST TECHNIQUE: Contiguous axial images were obtained from the base of the skull through the vertex without intravenous contrast. COMPARISON:  Head CT dated 11/28/2012 FINDINGS: Brain: There is mild to moderate age-related atrophy and chronic microvascular ischemic changes. Right frontal hypodense area likely subacute or chronic with  associated encephalomalacia. Clinical correlation is recommended. There is no acute intracranial hemorrhage. No mass effect or midline shift. No extra-axial fluid collection. Vascular: No hyperdense vessel or unexpected calcification. Skull: Normal. Negative for fracture or focal lesion. Sinuses/Orbits: Mild mucoperiosteal thickening of paranasal sinuses. No air-fluid levels. The mastoid air cells are clear. Other: [name] IMPRESSION: 1. No acute intracranial hemorrhage. 2. Age-related atrophy and chronic microvascular ischemic changes. Right frontal subacute or old infarct. Clinical correlation is recommended. Electronically Signed   By: Anner Crete M.D.   On: 04/23/2018 03:33   Ct Chest Wo Contrast  Result Date: 04/23/2018 CLINICAL DATA:  84 year old male found unresponsive with hypoglycemia. EXAM: CT CHEST WITHOUT CONTRAST TECHNIQUE: Multidetector CT imaging of the chest was performed following the standard protocol without IV contrast. COMPARISON:  Chest radiograph dated 04/22/2018 and CT of the abdomen pelvis dated 04/23/2018. FINDINGS: Evaluation of this exam is  limited in the absence of intravenous contrast. Cardiovascular: There is no cardiomegaly. No significant pericardial effusion. Multi vessel coronary vascular calcification. There is hypoattenuation of the cardiac blood pool suggestive of a degree of anemia. Clinical correlation is recommended. There is moderate atherosclerotic calcification of the thoracic aorta. The central pulmonary arteries are grossly unremarkable on this noncontrast CT. Mediastinum/Nodes: There is no hilar or mediastinal adenopathy. Evaluation is however limited in the absence of intravenous contrast. The esophagus is grossly unremarkable. Slight heterogeneity of the thyroid gland may be related to nodules. Ultrasound may provide better evaluation on a non emergent basis. No mediastinal fluid collection. Lungs/Pleura: Moderate-sized bilateral pleural effusions with associated partial compressive atelectasis of the lower lobes. Solid-appearing nodular lesions in the lingula (series 2, image 99 measuring 2.5 x 1.8 cm) and left lower lobe (series 2, image 106) are not well evaluated but may represent consolidative changes of the lungs although solid masses are not excluded. Close follow-up or further evaluation with CT with IV contrast is recommended. There is no pneumothorax. Central airways are patent. Upper Abdomen: Partially visualized gallstones. The visualized upper abdomen is otherwise unremarkable. Musculoskeletal: Diffuse subcutaneous edema and anasarca. Osteopenia with degenerative changes of the spine. No acute osseous pathology. IMPRESSION: 1. Moderate-sized bilateral pleural effusions with associated partial compressive atelectasis of the lower lobes. 2. Consolidative or atelectatic lungs versus solid nodular lesions in the lingula and left lower lobe. Close follow-up or further evaluation with CT with IV contrast is recommended. 3. Coronary vascular calcification andAortic Atherosclerosis (ICD10-I70.0). Electronically Signed   By:  Anner Crete M.D.   On: 04/23/2018 06:57   Mr Jodene Nam Head Wo Contrast  Result Date: 05/01/2018 CLINICAL DATA:  Stroke follow-up EXAM: MRA HEAD WITHOUT CONTRAST TECHNIQUE: Angiographic images of the Circle of Willis were obtained using MRA technique without intravenous contrast. COMPARISON:  Brain MRI from yesterday FINDINGS: The left ICA is slightly smaller than the right in the setting of left A1 hypoplasia. High-grade narrowing at the distal right M1 segment. Most of the left V4 segment is nonenhancing, likely from advanced atheromatous disease. The right vertebral and basilar are smooth and widely patent. High-grade narrowing at the proximal right P2 segment. IMPRESSION: 1. Intracranial atherosclerosis with advanced right M1/2 segment stenosis. 2. Left V4 segment occlusion with distal reconstitution. 3. Advanced right P2 stenosis. Electronically Signed   By: Monte Fantasia M.D.   On: 05/01/2018 11:07   Mr Brain Wo Contrast  Result Date: 04/30/2018 CLINICAL DATA:  84 y/o  M; altered mental status. EXAM: MRI HEAD WITHOUT CONTRAST TECHNIQUE: Multiplanar, multiecho pulse sequences of the brain and  surrounding structures were obtained without intravenous contrast. COMPARISON:  04/23/2018 CT head.  04/20/2011 MRI head. FINDINGS: Brain: Severely motion degraded study. Chronic infarct within the right lateral frontal lobe. Reduced diffusion at the posterior margin of the right lateral frontal chronic infarction compatible with new or recrudescence acute/early subacute infarction. Additionally, there is a punctate focus of reduced diffusion in the right occipital lobe compatible with a small vessel acute/early subacute infarction. No associated mass effect or gross hemorrhage. No herniation. Vascular: Normal flow voids. Skull and upper cervical spine: Normal marrow signal. Sinuses/Orbits: Negative. Other: [name]. IMPRESSION: 1. Severe motion artifact. 2. Chronic right lateral frontal lobe infarction. Reduced  diffusion at the posterior margin of the infarction is compatible with new or recrudescence acute/early subacute infarction. Additional punctate acute/early subacute infarction in right occipital lobe. No associated hemorrhage or mass effect identified. These results will be called to the ordering clinician or representative by the Radiologist Assistant, and communication documented in the PACS or zVision Dashboard. Electronically Signed   By: Kristine Garbe M.D.   On: 04/30/2018 18:37   US Carotid Bilateral  Result Date: 05/01/2018 CLINICAL DATA:  Stroke follow-up EXAM: BILATERAL CAROTID DUPLEX ULTRASOUND TECHNIQUE: Pearline Cables scale imaging, color Doppler and duplex ultrasound were performed of bilateral carotid and vertebral arteries in the neck. COMPARISON:  [name]. FINDINGS: Criteria: Quantification of carotid stenosis is based on velocity parameters that correlate the residual internal carotid diameter with NASCET-based stenosis levels, using the diameter of the distal internal carotid lumen as the denominator for stenosis measurement. The following velocity measurements were obtained: RIGHT ICA: 152 cm/sec CCA: 78 cm/sec SYSTOLIC ICA/CCA RATIO:  1.9 ECA: 124 cm/sec LEFT ICA: 106 cm/sec CCA: 69 cm/sec SYSTOLIC ICA/CCA RATIO:  1.5 ECA: 132 cm/sec RIGHT CAROTID ARTERY: Moderate calcified plaque in the bulb with shadowing of the lumen. Low resistance internal carotid Doppler pattern is preserved. RIGHT VERTEBRAL ARTERY:  Antegrade. LEFT CAROTID ARTERY: There is scattered mixed plaque throughout the left common carotid artery. This continues into the bulb. No obvious significant luminal narrowing is identified. LEFT VERTEBRAL ARTERY:  Nonvisualized. IMPRESSION: 50-69% stenosis in the right internal carotid artery. Less than 50% stenosis in the left internal carotid artery. The left vertebral artery cannot be identified.  It may be occluded. The right vertebral artery is antegrade in flow. Electronically  Signed   By: Marybelle Killings M.D.   On: 05/01/2018 13:02   Dg Chest Port 1 View  Result Date: 04/30/2018 CLINICAL DATA:  Chronic bilateral pleural effusions. EXAM: PORTABLE CHEST 1 VIEW COMPARISON:  CT 04/23/2018.  Chest x-ray 04/22/2018. FINDINGS: Mediastinum hilar structures normal. Cardiomegaly with diffuse bilateral from interstitial prominence and bilateral pleural effusions consistent with CHF. Underlying lingular/left base lesions noted on recent CT may be obscured by pulmonary edema and effusion. Continued follow-up exams again suggested as noted on prior CT of 04/23/2018. Severe degenerative changes both shoulders. IMPRESSION: Congestive heart failure with bilateral pulmonary interstitial edema and bilateral pleural effusions, particularly prominent on the left. 2. Lingular/left base lesions cyst noted on recent CT may be obscured by pulmonary edema and effusion. Continued follow-up exams suggested as noted on prior CT of 04/23/2018. Electronically Signed   By: Marcello Moores  Register   On: 04/30/2018 12:51   Dg Chest Port 1 View  Result Date: 04/23/2018 CLINICAL DATA:  Unresponsive 1 hour prior to admission. Lower extremity edema. EXAM: PORTABLE CHEST 1 VIEW COMPARISON:  12/28/2017 FINDINGS: Patient is rotated to the left. Lungs are adequately inflated demonstrate a stable small left pleural  effusion likely with associated basilar atelectasis. Cardiomediastinal silhouette and remainder of the exam is unchanged. IMPRESSION: Stable small left pleural effusion with associated basilar atelectasis. Electronically Signed   By: Marin Olp M.D.   On: 04/23/2018 00:01   Dg Swallowing Func-speech Pathology  Result Date: 04/25/2018 Objective Swallowing Evaluation: Type of Study: MBS-Modified Barium Swallow Study  Patient Details Name: Dave Mergen Micheletti MRN: [id number] Date of Birth: Aug 15, 1926 Today's Date: 04/25/2018 Time: SLP Start Time (ACUTE ONLY): 4098 -SLP Stop Time (ACUTE ONLY): 1630 SLP Time Calculation (min)  (ACUTE ONLY): 19 min Past Medical History: Past Medical History: Diagnosis Date . Anemia  . Arthritis  . Barrett's esophagus 05/14/2005  EGD Dr Sharlett Iles, gastritis . Chewing tobacco nicotine dependence without complication  . Colon polyps 05/14/2005  Dr Vernetta Honey path avail at this time . Dementia ([place]) 04/19/2011 . Diabetes mellitus  . Renal insufficiency  . S/P colonoscopy 05/14/2005  colonic avm cauterized . Shortness of breath  Past Surgical History: Past Surgical History: Procedure Laterality Date . COLONOSCOPY    per patient in [place] . COLONOSCOPY  04/25/2011  Dr. Oneida Alar: tubular adenomas, internal hemorrhoids . ESOPHAGOGASTRODUODENOSCOPY    per patient in [place] . ESOPHAGOGASTRODUODENOSCOPY  04/25/2011  Dr. Oneida Alar: esophagitis, multiple ulcers in antrum, +Barrett's, negative H.pylori. Surveillance 03/2014 . ESOPHAGOGASTRODUODENOSCOPY N/A 12/02/2012  Procedure: ESOPHAGOGASTRODUODENOSCOPY (EGD);  Surgeon: Danie Binder, MD;  [facility];  Service: Endoscopy;  Laterality: N/A; . GIVENS CAPSULE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

## 2018-05-04 NOTE — Progress Notes (Signed)
[name] discharged Home per MD order. Copy of d/c instructions sent with patient. Spoke with pt's DIL, Tammy, earlier to notify her that pt would be arriving via EMS later this morning.  Allergies as of 05/04/2018   No Known Allergies     Medication List    TAKE these medications   amLODipine 5 MG tablet Commonly known as:  NORVASC Take 1 tablet (5 mg total) by mouth daily.   aspirin 325 MG EC tablet Take 1 tablet (325 mg total) by mouth daily with breakfast.   calcium-vitamin D 500-200 MG-UNIT tablet Commonly known as:  OSCAL WITH D Take 1 tablet by mouth 2 (two) times daily.   carvedilol 6.25 MG tablet Commonly known as:  COREG Take 1 tablet (6.25 mg total) by mouth 2 (two) times daily with a meal.   cephALEXin 250 MG capsule Commonly known as:  KEFLEX Take 1 capsule (250 mg total) by mouth every 8 (eight) hours.   feeding supplement (GLUCERNA SHAKE) Liqd Take 237 mLs by mouth 3 (three) times daily between meals.   ferrous sulfate 325 (65 FE) MG tablet Take 1 tablet (325 mg total) by mouth 2 (two) times daily with a meal.   folic acid 1 MG tablet Commonly known as:  FOLVITE Take 1 tablet (1 mg total) by mouth daily.   multivitamins ther. w/minerals Tabs tablet Take 1 tablet by mouth daily.   pantoprazole 40 MG tablet Commonly known as:  PROTONIX Take 40 mg by mouth 2 (two) times daily.   polyethylene glycol packet Commonly known as:  MIRALAX / GLYCOLAX Take 17 g by mouth daily.   senna-docusate 8.6-50 MG tablet Commonly known as:  Senokot-S Take 1 tablet by mouth 2 (two) times daily.   sodium bicarbonate 650 MG tablet Take 1 tablet (650 mg total) by mouth 3 (three) times daily.   tamsulosin 0.4 MG Caps capsule Commonly known as:  FLOMAX Take 1 capsule by mouth daily.   torsemide 20 MG tablet Commonly known as:  DEMADEX Take 1 tablet (20 mg total) by mouth daily.       IV site discontinued and catheter remains intact. Site without signs and  symptoms of complications. Dressing and pressure applied. Pt transported home via [place] 05/04/2018 6:40 PM

## 2018-05-04 NOTE — Progress Notes (Signed)
Subjective: Interval History: Patient states he is "fine".  Severe hearing problem  Objective: Vital signs in last 24 hours: Temp:  [98.3 F (36.8 C)-98.9 F (37.2 C)] 98.5 F (36.9 C) (11/10 0532) Pulse Rate:  [34-72] 71 (11/10 0532) Resp:  [20] 20 (11/10 0532) BP: (121-138)/(50-54) 138/54 (11/10 0532) SpO2:  [93 %-95 %] 93 % (11/10 0532) Weight:  [71.4 kg] 71.4 kg (11/10 0550) Weight change: -4.2 kg  Intake/Output from previous day: 11/09 0701 - 11/10 0700 In: 720 [P.O.:720] Out: 1900 [Urine:1900] Intake/Output this shift: No intake/output data recorded.  Generally patient is alert.  He does not seem to be in any apparent distress. Chest: He has some inspiratory crackles Heart exam revealed regular rate and rhythm Extremities he has no  edema  Lab Results: Recent Labs    05/02/18 0559 05/04/18 0644  WBC 6.7 6.4  HGB 9.0* 8.7*  HCT 29.3* 28.6*  PLT 216 225   BMET:  Recent Labs    05/02/18 0559 05/04/18 0644  NA 136  137 141  K 4.6  4.7 4.4  CL 102  104 104  CO2 23  24 27   GLUCOSE 104*  105* 129*  BUN 104*  105* 99*  CREATININE 3.29*  3.34* 2.87*  CALCIUM 8.3*  8.4* 8.6*   No results for input(s): PTH in the last 72 hours. Iron Studies: No results for input(s): IRON, TIBC, TRANSFERRIN, FERRITIN in the last 72 hours.  Studies/Results: No results found.  I have reviewed the patient's current medications.  Assessment/Plan: 1] possibly acute kidney injury superimposed on chronic.  His renal function is improving.  His creatinine has returned to his baseline. 2] fluid management: Patient on Demadex orally.  He had 1900 cc of urine output over the last 24 hours.  His edema has improved and no significant sign of fluid overload. 3] low CO2: Possibly metabolic acidosis.  His CO2 is 27 high normal.  Patient is on sodium bicarbonate.  4] bone and mineral disorder: His calcium and phosphorus is range.  Diet controlled. 5] hypertension: His blood pressure  is reasonably controlled 6] history of diabetes : His blood sugar is good. 7] anemia: Anemia secondary to chronic renal failure.  Patient is on Aranesp once a week.  His hemoglobin is somewhat better. Plan: 1] decrease sodium bicarb to 650 mg p.o. twice daily 2] continue his diuretics at present dose.     LOS: 11 days   Izola Teague S 05/04/2018,9:13 AM

## 2018-05-06 LAB — CULTURE, BODY FLUID-BOTTLE: CULTURE: NO GROWTH

## 2018-05-06 LAB — CULTURE, BODY FLUID W GRAM STAIN -BOTTLE

## 2018-05-20 ENCOUNTER — Ambulatory Visit (INDEPENDENT_AMBULATORY_CARE_PROVIDER_SITE_OTHER): Payer: Medicare Other

## 2018-05-20 DIAGNOSIS — I499 Cardiac arrhythmia, unspecified: Secondary | ICD-10-CM

## 2018-05-29 ENCOUNTER — Other Ambulatory Visit: Payer: Self-pay

## 2018-05-29 ENCOUNTER — Emergency Department (HOSPITAL_COMMUNITY): Payer: Medicare Other

## 2018-05-29 ENCOUNTER — Encounter (HOSPITAL_COMMUNITY): Payer: Self-pay | Admitting: Emergency Medicine

## 2018-05-29 ENCOUNTER — Emergency Department (HOSPITAL_COMMUNITY)
Admission: EM | Admit: 2018-05-29 | Discharge: 2018-05-30 | Disposition: A | Discharge status: Home / Self Care | Payer: Medicare Other | Attending: Emergency Medicine | Admitting: Emergency Medicine

## 2018-05-29 DIAGNOSIS — E119 Type 2 diabetes mellitus without complications: Secondary | ICD-10-CM | POA: Diagnosis not present

## 2018-05-29 DIAGNOSIS — I10 Essential (primary) hypertension: Secondary | ICD-10-CM | POA: Insufficient documentation

## 2018-05-29 DIAGNOSIS — Z79899 Other long term (current) drug therapy: Secondary | ICD-10-CM | POA: Insufficient documentation

## 2018-05-29 DIAGNOSIS — R531 Weakness: Secondary | ICD-10-CM | POA: Insufficient documentation

## 2018-05-29 DIAGNOSIS — F0390 Unspecified dementia without behavioral disturbance: Secondary | ICD-10-CM | POA: Diagnosis not present

## 2018-05-29 DIAGNOSIS — Z7982 Long term (current) use of aspirin: Secondary | ICD-10-CM | POA: Diagnosis not present

## 2018-05-29 LAB — COMPREHENSIVE METABOLIC PANEL
ALK PHOS: 62 U/L (ref 38–126)
ALT: 11 U/L (ref 0–44)
ANION GAP: 8 (ref 5–15)
AST: 17 U/L (ref 15–41)
Albumin: 3.1 g/dL — ABNORMAL LOW (ref 3.5–5.0)
BILIRUBIN TOTAL: 0.3 mg/dL (ref 0.3–1.2)
BUN: 91 mg/dL — AB (ref 8–23)
CALCIUM: 9.9 mg/dL (ref 8.9–10.3)
CO2: 19 mmol/L — ABNORMAL LOW (ref 22–32)
Chloride: 112 mmol/L — ABNORMAL HIGH (ref 98–111)
Creatinine, Ser: 2.38 mg/dL — ABNORMAL HIGH (ref 0.61–1.24)
GFR calc Af Amer: 27 mL/min — ABNORMAL LOW (ref >60)
GFR, EST NON AFRICAN AMERICAN: 23 mL/min — AB (ref >60)
Glucose, Bld: 190 mg/dL — ABNORMAL HIGH (ref 70–99)
Potassium: 5 mmol/L (ref 3.5–5.1)
Sodium: 139 mmol/L (ref 135–145)
Total Protein: 7.4 g/dL (ref 6.5–8.1)

## 2018-05-29 LAB — URINALYSIS, ROUTINE W REFLEX MICROSCOPIC
BILIRUBIN URINE: NEGATIVE
Glucose, UA: NEGATIVE mg/dL
HGB URINE DIPSTICK: NEGATIVE
KETONES UR: NEGATIVE mg/dL
Leukocytes, UA: NEGATIVE
Nitrite: NEGATIVE
PROTEIN: NEGATIVE mg/dL
Specific Gravity, Urine: 1.01 (ref 1.005–1.030)
pH: 5 (ref 5.0–8.0)

## 2018-05-29 LAB — CBC WITH DIFFERENTIAL/PLATELET
Abs Immature Granulocytes: 0.02 10*3/uL (ref 0.00–0.07)
Basophils Absolute: 0 10*3/uL (ref 0.0–0.1)
Basophils Relative: 0 %
EOS PCT: 3 %
Eosinophils Absolute: 0.2 10*3/uL (ref 0.0–0.5)
HEMATOCRIT: 37.4 % — AB (ref 39.0–52.0)
HEMOGLOBIN: 11 g/dL — AB (ref 13.0–17.0)
Immature Granulocytes: 0 %
LYMPHS ABS: 0.8 10*3/uL (ref 0.7–4.0)
LYMPHS PCT: 12 %
MCH: 29.2 pg (ref 26.0–34.0)
MCHC: 29.4 g/dL — ABNORMAL LOW (ref 30.0–36.0)
MCV: 99.2 fL (ref 80.0–100.0)
MONOS PCT: 7 %
Monocytes Absolute: 0.5 10*3/uL (ref 0.1–1.0)
Neutro Abs: 5.4 10*3/uL (ref 1.7–7.7)
Neutrophils Relative %: 78 %
Platelets: 155 10*3/uL (ref 150–400)
RBC: 3.77 MIL/uL — ABNORMAL LOW (ref 4.22–5.81)
RDW: 14.5 % (ref 11.5–15.5)
WBC: 6.9 10*3/uL (ref 4.0–10.5)
nRBC: 0 % (ref 0.0–0.2)

## 2018-05-29 NOTE — ED Notes (Signed)
Pt not able to tolerate standing for orthostatic BP or to ambulate. RN and MD aware.

## 2018-05-29 NOTE — ED Triage Notes (Signed)
Patient brought in by EMS, states patient had physical therapy this morning for the first time and at 1700 today patient stood up and before family could get to patient, he fell into a sitting position. States patient has not stood in a month. No LOC. Patient denies pain, states "I got my feet tangled up, I can't half walk."
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
## 2018-05-31 ENCOUNTER — Telehealth: Payer: Self-pay | Admitting: Cardiology

## 2018-05-31 NOTE — Telephone Encounter (Signed)
[employer] called stating that patient had a run of vtach that was 9 beats long and 2 episodes of ventricular salvos and then PVCs that occurred at 4pm yesterday (delayed recording) and then NSR with occasional PVCs since then.  Heart monitor was ordered after patient was dx with CVA last month.  Rhythm strips have been sent to the office. Attempted to call patient who lives with his son but could not get an answer.

## 2018-06-02 NOTE — Telephone Encounter (Signed)
Left message to call back  

## 2018-06-02 NOTE — Telephone Encounter (Signed)
Attempted to call, busy signal.

## 2018-06-05 NOTE — Telephone Encounter (Signed)
Previous phone note indicates pt was discharged to [facility].  I called [facility] and pt is not at this facility.  I placed call to pt's son and left message to call office. Monitor was placed at [facility] office.  Will forward this phone note to [place] office.

## 2018-06-12 ENCOUNTER — Emergency Department (HOSPITAL_COMMUNITY): Payer: Medicare Other

## 2018-06-12 ENCOUNTER — Inpatient Hospital Stay (HOSPITAL_COMMUNITY)
Admission: EM | Admit: 2018-06-12 | Discharge: 2018-06-16 | DRG: 871 | Disposition: A | Discharge status: Home Health | Payer: Medicare Other | Attending: Family Medicine | Admitting: Family Medicine

## 2018-06-12 ENCOUNTER — Other Ambulatory Visit: Payer: Self-pay

## 2018-06-12 ENCOUNTER — Encounter (HOSPITAL_COMMUNITY): Payer: Self-pay | Admitting: Emergency Medicine

## 2018-06-12 DIAGNOSIS — Z8701 Personal history of pneumonia (recurrent): Secondary | ICD-10-CM | POA: Diagnosis not present

## 2018-06-12 DIAGNOSIS — R1310 Dysphagia, unspecified: Secondary | ICD-10-CM | POA: Diagnosis not present

## 2018-06-12 DIAGNOSIS — N184 Chronic kidney disease, stage 4 (severe): Secondary | ICD-10-CM | POA: Diagnosis present

## 2018-06-12 DIAGNOSIS — E870 Hyperosmolality and hypernatremia: Secondary | ICD-10-CM | POA: Diagnosis not present

## 2018-06-12 DIAGNOSIS — E440 Moderate protein-calorie malnutrition: Secondary | ICD-10-CM

## 2018-06-12 DIAGNOSIS — E1122 Type 2 diabetes mellitus with diabetic chronic kidney disease: Secondary | ICD-10-CM | POA: Diagnosis present

## 2018-06-12 DIAGNOSIS — H9190 Unspecified hearing loss, unspecified ear: Secondary | ICD-10-CM | POA: Diagnosis present

## 2018-06-12 DIAGNOSIS — I129 Hypertensive chronic kidney disease with stage 1 through stage 4 chronic kidney disease, or unspecified chronic kidney disease: Secondary | ICD-10-CM | POA: Diagnosis present

## 2018-06-12 DIAGNOSIS — A4151 Sepsis due to Escherichia coli [E. coli]: Secondary | ICD-10-CM | POA: Diagnosis not present

## 2018-06-12 DIAGNOSIS — R4702 Dysphasia: Secondary | ICD-10-CM | POA: Diagnosis present

## 2018-06-12 DIAGNOSIS — J690 Pneumonitis due to inhalation of food and vomit: Secondary | ICD-10-CM | POA: Diagnosis present

## 2018-06-12 DIAGNOSIS — E860 Dehydration: Secondary | ICD-10-CM | POA: Diagnosis present

## 2018-06-12 DIAGNOSIS — L89152 Pressure ulcer of sacral region, stage 2: Secondary | ICD-10-CM | POA: Diagnosis present

## 2018-06-12 DIAGNOSIS — N400 Enlarged prostate without lower urinary tract symptoms: Secondary | ICD-10-CM | POA: Diagnosis present

## 2018-06-12 DIAGNOSIS — Z87891 Personal history of nicotine dependence: Secondary | ICD-10-CM

## 2018-06-12 DIAGNOSIS — D631 Anemia in chronic kidney disease: Secondary | ICD-10-CM | POA: Diagnosis present

## 2018-06-12 DIAGNOSIS — Z6824 Body mass index (BMI) 24.0-24.9, adult: Secondary | ICD-10-CM

## 2018-06-12 DIAGNOSIS — Z79899 Other long term (current) drug therapy: Secondary | ICD-10-CM

## 2018-06-12 DIAGNOSIS — J189 Pneumonia, unspecified organism: Secondary | ICD-10-CM

## 2018-06-12 DIAGNOSIS — R6520 Severe sepsis without septic shock: Secondary | ICD-10-CM | POA: Diagnosis not present

## 2018-06-12 DIAGNOSIS — G9341 Metabolic encephalopathy: Secondary | ICD-10-CM | POA: Diagnosis present

## 2018-06-12 DIAGNOSIS — Z7982 Long term (current) use of aspirin: Secondary | ICD-10-CM

## 2018-06-12 DIAGNOSIS — Z7401 Bed confinement status: Secondary | ICD-10-CM | POA: Diagnosis not present

## 2018-06-12 DIAGNOSIS — E872 Acidosis, unspecified: Secondary | ICD-10-CM | POA: Diagnosis present

## 2018-06-12 DIAGNOSIS — R627 Adult failure to thrive: Secondary | ICD-10-CM | POA: Diagnosis present

## 2018-06-12 DIAGNOSIS — N179 Acute kidney failure, unspecified: Secondary | ICD-10-CM | POA: Diagnosis present

## 2018-06-12 DIAGNOSIS — F0390 Unspecified dementia without behavioral disturbance: Secondary | ICD-10-CM | POA: Diagnosis present

## 2018-06-12 DIAGNOSIS — D649 Anemia, unspecified: Secondary | ICD-10-CM | POA: Diagnosis not present

## 2018-06-12 DIAGNOSIS — I10 Essential (primary) hypertension: Secondary | ICD-10-CM | POA: Diagnosis not present

## 2018-06-12 DIAGNOSIS — G9340 Encephalopathy, unspecified: Secondary | ICD-10-CM | POA: Diagnosis present

## 2018-06-12 DIAGNOSIS — Z8673 Personal history of transient ischemic attack (TIA), and cerebral infarction without residual deficits: Secondary | ICD-10-CM | POA: Diagnosis not present

## 2018-06-12 DIAGNOSIS — A419 Sepsis, unspecified organism: Secondary | ICD-10-CM | POA: Diagnosis present

## 2018-06-12 DIAGNOSIS — Z66 Do not resuscitate: Secondary | ICD-10-CM | POA: Diagnosis present

## 2018-06-12 DIAGNOSIS — R4182 Altered mental status, unspecified: Secondary | ICD-10-CM

## 2018-06-12 DIAGNOSIS — L8990 Pressure ulcer of unspecified site, unspecified stage: Secondary | ICD-10-CM

## 2018-06-12 LAB — COMPREHENSIVE METABOLIC PANEL
ALK PHOS: 48 U/L (ref 38–126)
ALT: 9 U/L (ref 0–44)
AST: 12 U/L — ABNORMAL LOW (ref 15–41)
Albumin: 2.4 g/dL — ABNORMAL LOW (ref 3.5–5.0)
Anion gap: 5 (ref 5–15)
BUN: 116 mg/dL — ABNORMAL HIGH (ref 8–23)
CALCIUM: 9.2 mg/dL (ref 8.9–10.3)
CO2: 15 mmol/L — AB (ref 22–32)
CREATININE: 2.59 mg/dL — AB (ref 0.61–1.24)
Chloride: 125 mmol/L — ABNORMAL HIGH (ref 98–111)
GFR, EST AFRICAN AMERICAN: 24 mL/min — AB (ref >60)
GFR, EST NON AFRICAN AMERICAN: 21 mL/min — AB (ref >60)
Glucose, Bld: 204 mg/dL — ABNORMAL HIGH (ref 70–99)
Potassium: 4 mmol/L (ref 3.5–5.1)
Sodium: 145 mmol/L (ref 135–145)
Total Bilirubin: 0.4 mg/dL (ref 0.3–1.2)
Total Protein: 6.3 g/dL — ABNORMAL LOW (ref 6.5–8.1)

## 2018-06-12 LAB — CBC WITH DIFFERENTIAL/PLATELET
ABS IMMATURE GRANULOCYTES: 0.05 10*3/uL (ref 0.00–0.07)
Basophils Absolute: 0 10*3/uL (ref 0.0–0.1)
Basophils Relative: 0 %
Eosinophils Absolute: 0 10*3/uL (ref 0.0–0.5)
Eosinophils Relative: 0 %
HCT: 28.6 % — ABNORMAL LOW (ref 39.0–52.0)
HEMOGLOBIN: 8.4 g/dL — AB (ref 13.0–17.0)
IMMATURE GRANULOCYTES: 1 %
LYMPHS ABS: 1.2 10*3/uL (ref 0.7–4.0)
LYMPHS PCT: 17 %
MCH: 28.7 pg (ref 26.0–34.0)
MCHC: 29.4 g/dL — ABNORMAL LOW (ref 30.0–36.0)
MCV: 97.6 fL (ref 80.0–100.0)
MONO ABS: 0.6 10*3/uL (ref 0.1–1.0)
Monocytes Relative: 9 %
NEUTROS ABS: 5.2 10*3/uL (ref 1.7–7.7)
Neutrophils Relative %: 73 %
Platelets: 142 10*3/uL — ABNORMAL LOW (ref 150–400)
RBC: 2.93 MIL/uL — ABNORMAL LOW (ref 4.22–5.81)
RDW: 15.3 % (ref 11.5–15.5)
WBC: 7.1 10*3/uL (ref 4.0–10.5)
nRBC: 0 % (ref 0.0–0.2)

## 2018-06-12 LAB — LACTIC ACID, PLASMA
LACTIC ACID, VENOUS: 1.2 mmol/L (ref 0.5–1.9)
Lactic Acid, Venous: 1.3 mmol/L (ref 0.5–1.9)

## 2018-06-12 LAB — INFLUENZA PANEL BY PCR (TYPE A & B)
Influenza A By PCR: NEGATIVE
Influenza B By PCR: NEGATIVE

## 2018-06-12 MED ORDER — SODIUM CHLORIDE 0.9 % IV SOLN
3.0000 g | Freq: Four times a day (QID) | INTRAVENOUS | Status: DC
  Filled 2018-06-12 (×4): qty 3

## 2018-06-12 MED ORDER — ASPIRIN EC 325 MG PO TBEC
325.0000 mg | DELAYED_RELEASE_TABLET | Freq: Every day | ORAL | Status: DC
  Administered 2018-06-13 – 2018-06-16 (×4): 325 mg via ORAL
  Filled 2018-06-12 (×4): qty 1

## 2018-06-12 MED ORDER — SODIUM CHLORIDE 0.9 % IV SOLN
500.0000 mg | INTRAVENOUS | Status: DC
  Administered 2018-06-12 – 2018-06-14 (×3): 500 mg via INTRAVENOUS
  Filled 2018-06-12 (×5): qty 500

## 2018-06-12 MED ORDER — SODIUM CHLORIDE 0.9 % IV BOLUS (SEPSIS)
1000.0000 mL | Freq: Once | INTRAVENOUS | Status: AC
  Administered 2018-06-12: 1000 mL via INTRAVENOUS

## 2018-06-12 MED ORDER — FOLIC ACID 1 MG PO TABS
1.0000 mg | ORAL_TABLET | Freq: Every day | ORAL | Status: DC
  Administered 2018-06-12 – 2018-06-16 (×5): 1 mg via ORAL
  Filled 2018-06-12 (×5): qty 1

## 2018-06-12 MED ORDER — POLYETHYLENE GLYCOL 3350 17 G PO PACK: 17.0000 g | PACK | Freq: Every day | ORAL | Status: DC | PRN

## 2018-06-12 MED ORDER — SENNOSIDES-DOCUSATE SODIUM 8.6-50 MG PO TABS: 1.0000 | ORAL_TABLET | Freq: Every evening | ORAL | Status: DC | PRN

## 2018-06-12 MED ORDER — TAMSULOSIN HCL 0.4 MG PO CAPS
0.4000 mg | ORAL_CAPSULE | Freq: Every day | ORAL | Status: DC
  Administered 2018-06-12 – 2018-06-16 (×5): 0.4 mg via ORAL
  Filled 2018-06-12 (×5): qty 1

## 2018-06-12 MED ORDER — SODIUM BICARBONATE 8.4 % IV SOLN
INTRAVENOUS | Status: AC
  Filled 2018-06-12: qty 150

## 2018-06-12 MED ORDER — IPRATROPIUM-ALBUTEROL 0.5-2.5 (3) MG/3ML IN SOLN
3.0000 mL | Freq: Four times a day (QID) | RESPIRATORY_TRACT | Status: DC
  Administered 2018-06-12 – 2018-06-13 (×2): 3 mL via RESPIRATORY_TRACT
  Filled 2018-06-12 (×2): qty 3

## 2018-06-12 MED ORDER — PANTOPRAZOLE SODIUM 40 MG PO TBEC
40.0000 mg | DELAYED_RELEASE_TABLET | Freq: Two times a day (BID) | ORAL | Status: DC
  Administered 2018-06-12 – 2018-06-16 (×8): 40 mg via ORAL
  Filled 2018-06-12 (×8): qty 1

## 2018-06-12 MED ORDER — SODIUM CHLORIDE 0.9 % IV BOLUS (SEPSIS)
250.0000 mL | Freq: Once | INTRAVENOUS | Status: AC
  Administered 2018-06-12: 250 mL via INTRAVENOUS

## 2018-06-12 MED ORDER — SODIUM CHLORIDE 0.9 % IV SOLN
3.0000 g | Freq: Two times a day (BID) | INTRAVENOUS | Status: DC
  Administered 2018-06-12 – 2018-06-14 (×5): 3 g via INTRAVENOUS
  Filled 2018-06-12 (×9): qty 3

## 2018-06-12 MED ORDER — SODIUM CHLORIDE 0.9 % IV SOLN
1.0000 g | INTRAVENOUS | Status: DC
  Administered 2018-06-12: 1 g via INTRAVENOUS
  Filled 2018-06-12 (×2): qty 10

## 2018-06-12 MED ORDER — HEPARIN SODIUM (PORCINE) 5000 UNIT/ML IJ SOLN
5000.0000 [IU] | Freq: Three times a day (TID) | INTRAMUSCULAR | Status: DC
  Administered 2018-06-12 – 2018-06-16 (×10): 5000 [IU] via SUBCUTANEOUS
  Filled 2018-06-12 (×11): qty 1

## 2018-06-12 MED ORDER — SODIUM BICARBONATE 8.4 % IV SOLN
INTRAVENOUS | Status: AC
  Administered 2018-06-12 – 2018-06-13 (×2): via INTRAVENOUS
  Filled 2018-06-12 (×3): qty 850

## 2018-06-12 NOTE — ED Notes (Signed)
Cultures obtained from 2 different sites

## 2018-06-12 NOTE — ED Notes (Signed)
Attempted to in and out cath pt.  Foreskin around penis will not retract and cannot get catheter to pass.  Placed new condom catheter on penis.

## 2018-06-12 NOTE — ED Notes (Signed)
Attempted in and out cath. Unsuccessful attempt, due to clogged tubing

## 2018-06-12 NOTE — Progress Notes (Signed)
PHARMACY NOTE:  ANTIMICROBIAL RENAL DOSAGE ADJUSTMENT  Current antimicrobial regimen includes a mismatch between antimicrobial dosage and estimated renal function.  As per policy approved by the Pharmacy & Therapeutics and Medical Executive Committees, the antimicrobial dosage will be adjusted accordingly.  Current antimicrobial dosage:  Unasyn 3 gram IV q6h  Indication: Aspiration Pneumonia  Renal Function:  Estimated Creatinine Clearance: 18.5 mL/min (A) (by C-G formula based on SCr of 2.59 mg/dL (H)). []      On intermittent HD, scheduled: []      On CRRT    Antimicrobial dosage has been changed to:  Unasyn 3 gram IV q12h  Additional comments:   Thank you for allowing pharmacy to be a part of this patient's care.  Vernie Ammons, [facility] 06/12/2018 6:53 PM
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
## 2018-06-12 NOTE — ED Triage Notes (Signed)
Pt has been increasinley confused for past few days.  Per ems family reports fever at home 103. Given tylenol by family x 2 since 0430, unsure of amount of tylenol.
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
## 2018-06-12 NOTE — Progress Notes (Signed)
Patient arrived to floor with no family. I was not able to complete admission questions.

## 2018-06-12 NOTE — ED Notes (Signed)
Pt drank 2 cups of water without any problems.

## 2018-06-13 DIAGNOSIS — E440 Moderate protein-calorie malnutrition: Secondary | ICD-10-CM

## 2018-06-13 DIAGNOSIS — L8990 Pressure ulcer of unspecified site, unspecified stage: Secondary | ICD-10-CM

## 2018-06-13 LAB — CBC
HCT: 33.1 % — ABNORMAL LOW (ref 39.0–52.0)
HEMOGLOBIN: 9.8 g/dL — AB (ref 13.0–17.0)
MCH: 29 pg (ref 26.0–34.0)
MCHC: 29.6 g/dL — ABNORMAL LOW (ref 30.0–36.0)
MCV: 97.9 fL (ref 80.0–100.0)
Platelets: 147 10*3/uL — ABNORMAL LOW (ref 150–400)
RBC: 3.38 MIL/uL — ABNORMAL LOW (ref 4.22–5.81)
RDW: 15.2 % (ref 11.5–15.5)
WBC: 8.8 10*3/uL (ref 4.0–10.5)
nRBC: 0 % (ref 0.0–0.2)

## 2018-06-13 LAB — COMPREHENSIVE METABOLIC PANEL
ALT: 10 U/L (ref 0–44)
AST: 14 U/L — ABNORMAL LOW (ref 15–41)
Albumin: 2.3 g/dL — ABNORMAL LOW (ref 3.5–5.0)
Alkaline Phosphatase: 52 U/L (ref 38–126)
Anion gap: 7 (ref 5–15)
BUN: 88 mg/dL — ABNORMAL HIGH (ref 8–23)
CO2: 17 mmol/L — ABNORMAL LOW (ref 22–32)
Calcium: 9.1 mg/dL (ref 8.9–10.3)
Chloride: 125 mmol/L — ABNORMAL HIGH (ref 98–111)
Creatinine, Ser: 2.22 mg/dL — ABNORMAL HIGH (ref 0.61–1.24)
GFR calc Af Amer: 29 mL/min — ABNORMAL LOW (ref >60)
GFR calc non Af Amer: 25 mL/min — ABNORMAL LOW (ref >60)
GLUCOSE: 111 mg/dL — AB (ref 70–99)
Potassium: 3.7 mmol/L (ref 3.5–5.1)
Sodium: 149 mmol/L — ABNORMAL HIGH (ref 135–145)
TOTAL PROTEIN: 6.1 g/dL — AB (ref 6.5–8.1)
Total Bilirubin: 0.3 mg/dL (ref 0.3–1.2)

## 2018-06-13 LAB — BLOOD CULTURE ID PANEL (REFLEXED)
Acinetobacter baumannii: NOT DETECTED
CANDIDA TROPICALIS: NOT DETECTED
Candida albicans: NOT DETECTED
Candida glabrata: NOT DETECTED
Candida krusei: NOT DETECTED
Candida parapsilosis: NOT DETECTED
Carbapenem resistance: NOT DETECTED
Enterobacter cloacae complex: NOT DETECTED
Enterobacteriaceae species: DETECTED — AB
Enterococcus species: NOT DETECTED
Escherichia coli: DETECTED — AB
Haemophilus influenzae: NOT DETECTED
Klebsiella oxytoca: NOT DETECTED
Klebsiella pneumoniae: NOT DETECTED
Listeria monocytogenes: NOT DETECTED
Neisseria meningitidis: NOT DETECTED
PROTEUS SPECIES: NOT DETECTED
Pseudomonas aeruginosa: NOT DETECTED
STREPTOCOCCUS AGALACTIAE: NOT DETECTED
Serratia marcescens: NOT DETECTED
Staphylococcus aureus (BCID): NOT DETECTED
Staphylococcus species: NOT DETECTED
Streptococcus pneumoniae: NOT DETECTED
Streptococcus pyogenes: NOT DETECTED
Streptococcus species: NOT DETECTED

## 2018-06-13 LAB — PROTIME-INR
INR: 1.29
Prothrombin Time: 16 seconds — ABNORMAL HIGH (ref 11.4–15.2)

## 2018-06-13 LAB — CORTISOL-AM, BLOOD: CORTISOL - AM: 21.5 ug/dL (ref 6.7–22.6)

## 2018-06-13 LAB — PROCALCITONIN: Procalcitonin: 3.08 ng/mL

## 2018-06-13 MED ORDER — ENSURE ENLIVE PO LIQD
237.0000 mL | Freq: Two times a day (BID) | ORAL | Status: DC
  Administered 2018-06-13 – 2018-06-16 (×2): 237 mL via ORAL

## 2018-06-13 MED ORDER — IPRATROPIUM-ALBUTEROL 0.5-2.5 (3) MG/3ML IN SOLN
3.0000 mL | Freq: Four times a day (QID) | RESPIRATORY_TRACT | Status: DC
  Administered 2018-06-13: 3 mL via RESPIRATORY_TRACT
  Filled 2018-06-13: qty 3

## 2018-06-13 MED ORDER — IPRATROPIUM-ALBUTEROL 0.5-2.5 (3) MG/3ML IN SOLN
3.0000 mL | Freq: Two times a day (BID) | RESPIRATORY_TRACT | Status: DC
  Administered 2018-06-13 – 2018-06-16 (×6): 3 mL via RESPIRATORY_TRACT
  Filled 2018-06-13 (×6): qty 3

## 2018-06-13 MED ORDER — SENNOSIDES-DOCUSATE SODIUM 8.6-50 MG PO TABS: 1.0000 | ORAL_TABLET | Freq: Every evening | ORAL | Status: DC | PRN

## 2018-06-13 NOTE — Progress Notes (Signed)
Patient had positive blood cultures.  Anaerobic blood culture positive for gram negative rods.  BCID showed Ecoli.  On call MD notified.

## 2018-06-13 NOTE — Progress Notes (Signed)
PROGRESS NOTE    Layton Naves Venhuizen  IOX:[id number] DOB: 1926-09-16 DOA: 06/12/2018 PCP: Lemmie Evens, MD    Brief Narrative:  84 year old male with a history of dementia, previous stroke, dysphagia, failure to thrive, who is essentially been bedbound since fracturing his hip earlier this year.  Patient was brought to the hospital with increasing shortness of breath and cough.  He also became less responsive.  He was noted to have a fever at home as high as 103.  He was found to have possible aspiration pneumonia as well as dehydration and acute on chronic kidney disease.  Blood cultures have returned positive for gram-negative rods.  He is on IV antibiotics.   Assessment & Plan:   Active Problems:   HTN (hypertension)   Anemia   Dementia (HCC)   BPH (benign prostatic hyperplasia)   Encephalopathy   Sepsis ([place])   Acute renal failure superimposed on stage 4 chronic kidney disease ([place])   DNR (do not resuscitate)   Dysphagia   Aspiration pneumonia ([place])   Metabolic acidosis   Malnutrition of moderate degree   Pressure injury of skin   1. Sepsis.  Patient found to have positive blood cultures with gram-negative rods.  Currently on intravenous Unasyn.  We will follow-up sensitivities.  Hemodynamics are stable.  Continue current treatments. 2. Aspiration pneumonia.  With history of dysphasia, aspiration pneumonia is likely.  He is currently on Unasyn and azithromycin 3. Acute renal failure.  Superimposed on chronic kidney disease stage IV.  Mild improvement with IV fluids. 4. Metabolic acidosis.  Related to renal failure.  Currently on bicarbonate infusion with mild improvement. 5. Acute encephalopathy, superimposed on baseline dementia.  Patient was increasingly lethargic on arrival.  Overall mental status appears to be improving with IV hydration. 6. BPH.  Continue Flomax. 7. Anemia of chronic kidney disease.  Hemoglobin is currently stable.  Continue to follow   DVT prophylaxis:  Heparin Code Status: DNR Family Communication: No family present Disposition Plan: Probable discharge home once improved   Consultants:     Procedures:     Antimicrobials:   Unasyn 12/19 >  Azithromycin 12/19 >   Subjective: Limited history due to hearing impairment.  Says he feeling better today.  He does have a cough.  Objective: Vitals:   06/13/18 0525 06/13/18 0842 06/13/18 1404 06/13/18 1933  BP: 128/64  (!) 141/68   Pulse: 94  86 70  Resp: 18  18 18   Temp: 98.6 F (37 C)  98.3 F (36.8 C)   TempSrc: Oral  Oral   SpO2: 100% 99% 100% 99%  Weight:      Height:        Intake/Output Summary (Last 24 hours) at 06/13/2018 2014 Last data filed at 06/13/2018 1700 Gross per 24 hour  Intake 2768.16 ml  Output 1350 ml  Net 1418.16 ml   Filed Weights   06/12/18 0928 06/12/18 1800  Weight: 74 kg 67.5 kg    Examination:  General exam: Appears calm and comfortable  Respiratory system: Clear to auscultation. Respiratory effort normal. Cardiovascular system: S1 & S2 heard, RRR. No JVD, murmurs, rubs, gallops or clicks. No pedal edema. Gastrointestinal system: Abdomen is nondistended, soft and nontender. No organomegaly or masses felt. Normal bowel sounds heard. Central nervous system:  No focal neurological deficits. Extremities: Symmetric 5 x 5 power. Skin: No rashes, lesions or ulcers Psychiatry: Pleasant, limited conversation due to hearing impairment    Data Reviewed: I have personally reviewed following labs and  imaging studies  CBC: Recent Labs  Lab 06/12/18 1012 06/13/18 0603  WBC 7.1 8.8  NEUTROABS 5.2  --   HGB 8.4* 9.8*  HCT 28.6* 33.1*  MCV 97.6 97.9  PLT 142* 269*   Basic Metabolic Panel: Recent Labs  Lab 06/12/18 1012 06/13/18 0603  NA 145 149*  K 4.0 3.7  CL 125* 125*  CO2 15* 17*  GLUCOSE 204* 111*  BUN 116* 88*  CREATININE 2.59* 2.22*  CALCIUM 9.2 9.1   GFR: Estimated Creatinine Clearance: 21.5 mL/min (A) (by C-G formula  based on SCr of 2.22 mg/dL (H)). Liver Function Tests: Recent Labs  Lab 06/12/18 1012 06/13/18 0603  AST 12* 14*  ALT 9 10  ALKPHOS 48 52  BILITOT 0.4 0.3  PROT 6.3* 6.1*  ALBUMIN 2.4* 2.3*   No results for input(s): LIPASE, AMYLASE in the last 168 hours. No results for input(s): AMMONIA in the last 168 hours. Coagulation Profile: Recent Labs  Lab 06/13/18 0603  INR 1.29   Cardiac Enzymes: No results for input(s): CKTOTAL, CKMB, CKMBINDEX, TROPONINI in the last 168 hours. BNP (last 3 results) No results for input(s): PROBNP in the last 8760 hours. HbA1C: No results for input(s): HGBA1C in the last 72 hours. CBG: No results for input(s): GLUCAP in the last 168 hours. Lipid Profile: No results for input(s): CHOL, HDL, LDLCALC, TRIG, CHOLHDL, LDLDIRECT in the last 72 hours. Thyroid Function Tests: No results for input(s): TSH, T4TOTAL, FREET4, T3FREE, THYROIDAB in the last 72 hours. Anemia Panel: No results for input(s): VITAMINB12, FOLATE, FERRITIN, TIBC, IRON, RETICCTPCT in the last 72 hours. Sepsis Labs: Recent Labs  Lab 06/12/18 1036 06/12/18 1233 06/13/18 0603  PROCALCITON  --   --  3.08  LATICACIDVEN 1.3 1.2  --     Recent Results (from the past 240 hour(s))  Wound or Superficial Culture     Status: None (Preliminary result)   Collection Time: 06/12/18 10:13 AM  Result Value Ref Range Status   Specimen Description   Final    PENIS Performed at [facility], [address], [place], [place] [address]    Special Requests   Final    NONE Performed at [facility], [address], [place], [place] [address]    Gram Stain   Final    RARE WBC PRESENT,BOTH PMN AND MONONUCLEAR MODERATE GRAM NEGATIVE RODS RARE GRAM POSITIVE COCCI    Culture   Final    TOO YOUNG TO READ Performed at [facility], [place] [address] [place], [place] [address]    Report Status PENDING  Incomplete  Blood Culture (routine x 2)     Status: None (Preliminary result)    Collection Time: 06/12/18 10:16 AM  Result Value Ref Range Status   Specimen Description   Final    BLOOD RIGHT WRIST DRAWN BY RN Performed at [facility], [address] [place] [address], [place], [place] [address]    Special Requests   Final    BOTTLES DRAWN AEROBIC AND ANAEROBIC Blood Culture adequate volume Performed at [facility], [address], [place], [place] [address]    Culture  Setup Time   Final    ANAEROBIC BOTTLE ONLY GRAM NEGATIVE RODS Gram Stain Report Called to,Read Back By and Verified With: C HANDY,RN @0037  06/13/18 MKELLY Organism ID to follow CRITICAL RESULT CALLED TO, READ BACK BY AND VERIFIED WITH: T HANDY RN [phone] 06/13/18 A BROWNING Performed at [facility], [place] [address] [place], [place] [address]  Culture   Final    NO GROWTH < 24 HOURS Performed at [facility], [address], [place], [place] [address]    Report Status PENDING  Incomplete  Blood Culture ID Panel (Reflexed)     Status: Abnormal   Collection Time: 06/12/18 10:16 AM  Result Value Ref Range Status   Enterococcus species NOT DETECTED NOT DETECTED Final   Listeria monocytogenes NOT DETECTED NOT DETECTED Final   Staphylococcus species NOT DETECTED NOT DETECTED Final   Staphylococcus aureus (BCID) NOT DETECTED NOT DETECTED Final   Streptococcus species NOT DETECTED NOT DETECTED Final   Streptococcus agalactiae NOT DETECTED NOT DETECTED Final   Streptococcus pneumoniae NOT DETECTED NOT DETECTED Final   Streptococcus pyogenes NOT DETECTED NOT DETECTED Final   Acinetobacter baumannii NOT DETECTED NOT DETECTED Final   Enterobacteriaceae species DETECTED (A) NOT DETECTED Final    Comment: Enterobacteriaceae represent a large family of gram-negative bacteria, not a single organism. CRITICAL RESULT CALLED TO, READ BACK BY AND VERIFIED WITH: T HANDY RN [phone] 06/13/18 A BROWNING    Enterobacter cloacae complex NOT DETECTED NOT DETECTED Final   Escherichia coli DETECTED (A) NOT DETECTED Final     Comment: CRITICAL RESULT CALLED TO, READ BACK BY AND VERIFIED WITH: T HANDY RN [phone] 06/13/18 A BROWNING    Klebsiella oxytoca NOT DETECTED NOT DETECTED Final   Klebsiella pneumoniae NOT DETECTED NOT DETECTED Final   Proteus species NOT DETECTED NOT DETECTED Final   Serratia marcescens NOT DETECTED NOT DETECTED Final   Carbapenem resistance NOT DETECTED NOT DETECTED Final   Haemophilus influenzae NOT DETECTED NOT DETECTED Final   Neisseria meningitidis NOT DETECTED NOT DETECTED Final   Pseudomonas aeruginosa NOT DETECTED NOT DETECTED Final   Candida albicans NOT DETECTED NOT DETECTED Final   Candida glabrata NOT DETECTED NOT DETECTED Final   Candida krusei NOT DETECTED NOT DETECTED Final   Candida parapsilosis NOT DETECTED NOT DETECTED Final   Candida tropicalis NOT DETECTED NOT DETECTED Final    Comment: Performed at [facility], [place] [address] [place], [place] [address]  Blood Culture (routine x 2)     Status: None (Preliminary result)   Collection Time: 06/12/18 10:17 AM  Result Value Ref Range Status   Specimen Description BLOOD LEFT ANTECUBITAL DRAWN BY RN  Final   Special Requests   Final    BOTTLES DRAWN AEROBIC AND ANAEROBIC Blood Culture adequate volume   Culture  Setup Time   Final    ANAEROBIC BOTTLE ONLY GRAM NEGATIVE RODS Gram Stain Report Called to,Read Back By and Verified With: C HANDY,RN @0037  06/13/18 MKELLY    Culture   Final    NO GROWTH < 24 HOURS Performed at [facility], [address], [place], [place] [address]    Report Status PENDING  Incomplete         Radiology Studies: Dg Chest Port 1 View  Result Date: 06/12/2018 CLINICAL DATA:  Sepsis EXAM: PORTABLE CHEST 1 VIEW COMPARISON:  05/01/2018 FINDINGS: Heart is borderline in size. Left lower lobe atelectasis or infiltrate with probable small left effusion. Findings similar to prior study. Right lung clear. No acute bony abnormality. IMPRESSION: Small left effusion with left lower lobe  atelectasis or infiltrate, similar to prior study. Electronically Signed   By: Rolm Baptise M.D.   On: 06/12/2018 10:42        Scheduled Meds: . aspirin  325 mg Oral Q breakfast  . feeding supplement (ENSURE ENLIVE)  237 mL Oral BID  BM  . folic acid  1 mg Oral Daily  . heparin  5,000 Units Subcutaneous Q8H  . ipratropium-albuterol  3 mL Nebulization BID  . pantoprazole  40 mg Oral BID  . tamsulosin  0.4 mg Oral Daily   Continuous Infusions: . ampicillin-sulbactam (UNASYN) IV Stopped (06/13/18 1031)  . azithromycin Stopped (06/13/18 1259)     LOS: 1 day    Time spent: 29mins    Kathie Dike, MD [facility] Pager [phone]  If 7PM-7AM, please contact night-coverage [email or web address] Password [email or web address] 06/13/2018, 8:14 PM

## 2018-06-13 NOTE — Progress Notes (Signed)
PHARMACY - PHYSICIAN COMMUNICATION CRITICAL VALUE ALERT - BLOOD CULTURE IDENTIFICATION (BCID)  [name] is an 84 y.o. male who presented to [facility] on 06/12/2018 with a chief complaint of lethargy and fever  Assessment:  84 yo presented with increasing shortness of breath and cough.  He has been less responsive and developed significant fever as high as 103 at home.  He has been increasingly lethargic.  Suspected aspiration PNA on admission. Blood cultures are now positive for E.Coli.    Name of physician (or Provider) Contacted: Dr. Ulice Bold  Current antibiotics: Unasyn and Azithromycin  Changes to prescribed antibiotics recommended:  Pt has improved since admission and is afebrile. MD plans to continue current regimen and follow up with sensitivities.   Results for orders placed or performed during the hospital encounter of 06/12/18  Blood Culture ID Panel (Reflexed) (Collected: 06/12/2018 10:16 AM)  Result Value Ref Range   Enterococcus species NOT DETECTED NOT DETECTED   Listeria monocytogenes NOT DETECTED NOT DETECTED   Staphylococcus species NOT DETECTED NOT DETECTED   Staphylococcus aureus (BCID) NOT DETECTED NOT DETECTED   Streptococcus species NOT DETECTED NOT DETECTED   Streptococcus agalactiae NOT DETECTED NOT DETECTED   Streptococcus pneumoniae NOT DETECTED NOT DETECTED   Streptococcus pyogenes NOT DETECTED NOT DETECTED   Acinetobacter baumannii NOT DETECTED NOT DETECTED   Enterobacteriaceae species DETECTED (A) NOT DETECTED   Enterobacter cloacae complex NOT DETECTED NOT DETECTED   Escherichia coli DETECTED (A) NOT DETECTED   Klebsiella oxytoca NOT DETECTED NOT DETECTED   Klebsiella pneumoniae NOT DETECTED NOT DETECTED   Proteus species NOT DETECTED NOT DETECTED   Serratia marcescens NOT DETECTED NOT DETECTED   Carbapenem resistance NOT DETECTED NOT DETECTED   Haemophilus influenzae NOT DETECTED NOT DETECTED   Neisseria meningitidis NOT DETECTED NOT DETECTED    Pseudomonas aeruginosa NOT DETECTED NOT DETECTED   Candida albicans NOT DETECTED NOT DETECTED   Candida glabrata NOT DETECTED NOT DETECTED   Candida krusei NOT DETECTED NOT DETECTED   Candida parapsilosis NOT DETECTED NOT DETECTED   Candida tropicalis NOT DETECTED NOT DETECTED    [name], [name], BCPS Clinical Pharmacist Pager [phone] 06/13/2018  10:42 AM

## 2018-06-13 NOTE — Progress Notes (Signed)
Initial Nutrition Assessment  DOCUMENTATION CODES:  Non-severe (moderate) malnutrition in context of chronic illness  INTERVENTION:  Ensure Enlive po BID, each supplement provides 350 kcal and 20 grams of protein  Per last objective evaluation, pt cleared for D2, TL diet. To promote PO intake, would consider liberalize diet to this consistency or re consult speech if there is concern over tolerance.   NUTRITION DIAGNOSIS:  Moderate Malnutrition(Chronic) related to chronic illness(Dementia/dysphagia) as evidenced by mild muscle/fat wasting and and an apparent loss of >9% bw in 1.5 months  GOAL:  Patient will meet greater than or equal to 90% of their needs  MONITOR:  PO intake, Supplement acceptance, Diet advancement, Labs, I & O's, Weight trends  REASON FOR ASSESSMENT:  Low [place]    ASSESSMENT:  84 y/o male PMHx CKD4, DM2, dementia, CVA, dysphagia, FTT. Had hip fracture s/p repair 7/6, from which point he decline, now essentially being bed bound. Recently admitted 10/29-11/10 for sepsis 2/2 aspiration PNA. Represents to ED w/ lethargy and fever. Work up revealed possible PNA and pt admitted for management.   On RD arrival, pt alone. No family at bedside. Pt does not arouse to verbal/physical stimuli.  History obtained from chart. Pt w/ known hx of dysphagia and noted to be at high risk of aspiration. Per review of last available ST note, pt recommended to receive D2 diet w/ TL. He is currently on a D1 diet w/ TL.  From notes, it appears patient had been doing relatively well up until 12/18, at which time he begun having decreased responsiveness and fever.   Reviewing weight history, pt was admitted at weight of 164.4 lbs when admitted back on 10/30. Prior that time, he looks to have maintained a weight around 160 since at least July. He was discharged 11/10 at weight of 157.5 lbs.  Yesterday, he was admitted at a weight of 148.8 lbs. This marks a loss of 15.5 lbs (>9% bw) in about a  month and a half.   At this time, though he is thought to be on a more restrictive diet than he is normally, he is Documented as having eaten 75% of both breakfast and lunch today.  Given his pressure wound, will add ensure BID. Would consider changing pt diet back to D2, TL diet, per his last ST evaluation, and if he appears to not be tolerating textures, consult ST for reevaluation.   Labs:  Albumin: 2.3 -> 2.4 BUN/Creat:116/2.59 -> 88/2.22 Glu: 190-204 K: 5.0-> 4.0 Meds: PPI, Folate, IVF, Iv abx,   Recent Labs  Lab 06/12/18 1012 06/13/18 0603  NA 145 149*  K 4.0 3.7  CL 125* 125*  CO2 15* 17*  BUN 116* 88*  CREATININE 2.59* 2.22*  CALCIUM 9.2 9.1  GLUCOSE 204* 111*   NUTRITION - FOCUSED PHYSICAL EXAM:   Most Recent Value  Orbital Region  No depletion  Upper Arm Region  Mild depletion  Thoracic and Lumbar Region  Mild depletion  Buccal Region  Mild depletion  Temple Region  Moderate depletion  Clavicle Bone Region  Unable to assess  Clavicle and Acromion Bone Region  Unable to assess  Scapular Bone Region  Unable to assess  Dorsal Hand  No depletion  Patellar Region  No depletion  Anterior Thigh Region  Moderate depletion  Posterior Calf Region  Mild depletion  Edema (RD Assessment)  None  Hair  Reviewed  Eyes  Reviewed  Mouth  Reviewed  Skin  Reviewed  Nails  Reviewed  Diet Order:   Diet Order            DIET - DYS 1 Room service appropriate? Yes; Fluid consistency: Thin  Diet effective now             EDUCATION NEEDS:  No education needs have been identified at this time  Skin:  Stage II Pressure wound to sacrum Wound/Incision to L great toe, L 2nd toe  and R great toe Rash to penis  Last BM:  12/20  Height:  Ht Readings from Last 1 Encounters:  06/12/18 5\' 8"  (1.727 m)   Weight:  Wt Readings from Last 1 Encounters:  06/12/18 67.5 kg   Wt Readings from Last 10 Encounters:  06/12/18 67.5 kg  05/29/18 74.8 kg  05/04/18 71.4 kg   12/31/17 74 kg  04/26/17 73 kg  09/06/15 61.7 kg  11/28/12 61.7 kg  11/24/12 63.5 kg  03/12/12 72.6 kg  02/20/12 67.3 kg   Ideal Body Weight:  63 kg(Adjusted -10% for bedbound status)  BMI:  Body mass index is 22.63 kg/m.  Estimated Nutritional Needs:  Kcal:  1800-2000 kcals (27-30 kcal/kg bw) Protein:  88-101g Pro (1.3-1.5g/kg bw) Fluid:  >1.7 L fluid (25 ml/kg bw)  Burtis Junes RD, LDN, CNSC Clinical Nutrition Available Tues-Sat via Pager: [phone] 06/13/2018 4:09 PM

## 2018-06-14 ENCOUNTER — Other Ambulatory Visit: Payer: Self-pay

## 2018-06-14 LAB — BASIC METABOLIC PANEL
Anion gap: 5 (ref 5–15)
BUN: 84 mg/dL — ABNORMAL HIGH (ref 8–23)
CO2: 21 mmol/L — AB (ref 22–32)
Calcium: 8.5 mg/dL — ABNORMAL LOW (ref 8.9–10.3)
Chloride: 123 mmol/L — ABNORMAL HIGH (ref 98–111)
Creatinine, Ser: 2.14 mg/dL — ABNORMAL HIGH (ref 0.61–1.24)
GFR calc Af Amer: 31 mL/min — ABNORMAL LOW (ref >60)
GFR calc non Af Amer: 26 mL/min — ABNORMAL LOW (ref >60)
GLUCOSE: 100 mg/dL — AB (ref 70–99)
Potassium: 3.6 mmol/L (ref 3.5–5.1)
Sodium: 149 mmol/L — ABNORMAL HIGH (ref 135–145)

## 2018-06-14 LAB — CBC
HCT: 26.3 % — ABNORMAL LOW (ref 39.0–52.0)
Hemoglobin: 7.8 g/dL — ABNORMAL LOW (ref 13.0–17.0)
MCH: 28.2 pg (ref 26.0–34.0)
MCHC: 29.7 g/dL — AB (ref 30.0–36.0)
MCV: 94.9 fL (ref 80.0–100.0)
Platelets: 134 10*3/uL — ABNORMAL LOW (ref 150–400)
RBC: 2.77 MIL/uL — ABNORMAL LOW (ref 4.22–5.81)
RDW: 15.3 % (ref 11.5–15.5)
WBC: 5.2 10*3/uL (ref 4.0–10.5)
nRBC: 0 % (ref 0.0–0.2)

## 2018-06-14 NOTE — Progress Notes (Signed)
PROGRESS NOTE    [name]  ZOX:[id number] DOB: [birth year] DOA: 06/12/2018 PCP: Lemmie Evens, MD    Brief Narrative:  84 year old male with a history of dementia, previous stroke, dysphagia, failure to thrive, who is essentially been bedbound since fracturing his hip earlier this year.  Patient was brought to the hospital with increasing shortness of breath and cough.  He also became less responsive.  He was noted to have a fever at home as high as 103.  He was found to have possible aspiration pneumonia as well as dehydration and acute on chronic kidney disease.  Blood cultures have returned positive for gram-negative rods.  He is on IV antibiotics.   Assessment & Plan:   Active Problems:   HTN (hypertension)   Anemia   Dementia (HCC)   BPH (benign prostatic hyperplasia)   Encephalopathy   Sepsis ([place])   Acute renal failure superimposed on stage 4 chronic kidney disease ([place])   DNR (do not resuscitate)   Dysphagia   Aspiration pneumonia ([place])   Metabolic acidosis   Malnutrition of moderate degree   Pressure injury of skin   1. Sepsis.  Patient found to have positive blood cultures with E coli.  Currently on intravenous Unasyn.  We will follow-up sensitivities.  Hemodynamics are stable.  Continue current treatments. 2. Aspiration pneumonia.  With history of dysphagia, aspiration pneumonia is likely.  He is currently on Unasyn 3. Acute renal failure.  Superimposed on chronic kidney disease stage IV.  Mild improvement with IV fluids. 4. Metabolic acidosis.  Related to renal failure.  Resolved with bicarbonate infusion 5. Acute encephalopathy, superimposed on baseline dementia.  Patient was increasingly lethargic on arrival.  Overall mental status appears to be improving with IV hydration.  6. BPH.  Continue Flomax. 7. Anemia of chronic kidney disease.  Hemoglobin has trended down with hydration. No signs of bleeding.  Continue to follow   DVT prophylaxis: Heparin Code  Status: DNR Family Communication: No family present Disposition Plan: Probable discharge home once improved   Consultants:     Procedures:     Antimicrobials:   Unasyn 12/19 >  Azithromycin 12/19 >12/21   Subjective: Patient reports continued cough.  Denies shortness of breath.  Objective: Vitals:   06/14/18 0755 06/14/18 0815 06/14/18 1454 06/14/18 1924  BP: (!) 147/63  (!) 140/52   Pulse: 70  77   Resp: (!) 24  (!) 21   Temp:   98.2 F (36.8 C)   TempSrc:   Oral   SpO2: 99% 96% 91% 94%  Weight:      Height:        Intake/Output Summary (Last 24 hours) at 06/14/2018 1938 Last data filed at 06/14/2018 1500 Gross per 24 hour  Intake 950 ml  Output 901 ml  Net 49 ml   Filed Weights   06/12/18 0928 06/12/18 1800  Weight: 74 kg 67.5 kg    Examination:  General exam: Alert, awake, no distress Respiratory system: Bilateral rhonchi. Respiratory effort normal. Cardiovascular system:RRR. No murmurs, rubs, gallops. Gastrointestinal system: Abdomen is nondistended, soft and nontender. No organomegaly or masses felt. Normal bowel sounds heard. Central nervous system: No focal neurological deficits. Extremities: 1+ edema bilaterally Skin: No rashes, lesions or ulcers Psychiatry: Pleasant, limited due to hearing impairment     Data Reviewed: I have personally reviewed following labs and imaging studies  CBC: Recent Labs  Lab 06/12/18 1012 06/13/18 0603 06/14/18 0722  WBC 7.1 8.8 5.2  NEUTROABS 5.2  --   --  HGB 8.4* 9.8* 7.8*  HCT 28.6* 33.1* 26.3*  MCV 97.6 97.9 94.9  PLT 142* 147* 268*   Basic Metabolic Panel: Recent Labs  Lab 06/12/18 1012 06/13/18 0603 06/14/18 0722  NA 145 149* 149*  K 4.0 3.7 3.6  CL 125* 125* 123*  CO2 15* 17* 21*  GLUCOSE 204* 111* 100*  BUN 116* 88* 84*  CREATININE 2.59* 2.22* 2.14*  CALCIUM 9.2 9.1 8.5*   GFR: Estimated Creatinine Clearance: 22.3 mL/min (A) (by C-G formula based on SCr of 2.14 mg/dL  (H)). Liver Function Tests: Recent Labs  Lab 06/12/18 1012 06/13/18 0603  AST 12* 14*  ALT 9 10  ALKPHOS 48 52  BILITOT 0.4 0.3  PROT 6.3* 6.1*  ALBUMIN 2.4* 2.3*   No results for input(s): LIPASE, AMYLASE in the last 168 hours. No results for input(s): AMMONIA in the last 168 hours. Coagulation Profile: Recent Labs  Lab 06/13/18 0603  INR 1.29   Cardiac Enzymes: No results for input(s): CKTOTAL, CKMB, CKMBINDEX, TROPONINI in the last 168 hours. BNP (last 3 results) No results for input(s): PROBNP in the last 8760 hours. HbA1C: No results for input(s): HGBA1C in the last 72 hours. CBG: No results for input(s): GLUCAP in the last 168 hours. Lipid Profile: No results for input(s): CHOL, HDL, LDLCALC, TRIG, CHOLHDL, LDLDIRECT in the last 72 hours. Thyroid Function Tests: No results for input(s): TSH, T4TOTAL, FREET4, T3FREE, THYROIDAB in the last 72 hours. Anemia Panel: No results for input(s): VITAMINB12, FOLATE, FERRITIN, TIBC, IRON, RETICCTPCT in the last 72 hours. Sepsis Labs: Recent Labs  Lab 06/12/18 1036 06/12/18 1233 06/13/18 0603  PROCALCITON  --   --  3.08  LATICACIDVEN 1.3 1.2  --     Recent Results (from the past 240 hour(s))  Wound or Superficial Culture     Status: None (Preliminary result)   Collection Time: 06/12/18 10:13 AM  Result Value Ref Range Status   Specimen Description   Final    PENIS Performed at [facility], [address], [place], [place] [address]    Special Requests   Final    NONE Performed at [facility], [address] [place], [place] [address]    Gram Stain   Final    RARE WBC PRESENT,BOTH PMN AND MONONUCLEAR MODERATE GRAM NEGATIVE RODS RARE GRAM POSITIVE COCCI    Culture   Final    MODERATE GRAM NEGATIVE RODS MODERATE PSEUDOMONAS AERUGINOSA SUSCEPTIBILITIES TO FOLLOW Performed at [facility], [place] [address] [place], [place] [address]    Report Status PENDING  Incomplete  Blood Culture (routine x 2)      Status: Abnormal (Preliminary result)   Collection Time: 06/12/18 10:16 AM  Result Value Ref Range Status   Specimen Description   Final    BLOOD RIGHT WRIST DRAWN BY RN Performed at [facility], [address] [place], [place] [address]    Special Requests   Final    BOTTLES DRAWN AEROBIC AND ANAEROBIC Blood Culture adequate volume Performed at [facility], [address], [place], [place] [address]    Culture  Setup Time   Final    ANAEROBIC BOTTLE ONLY GRAM NEGATIVE RODS Gram Stain Report Called to,Read Back By and Verified With: C HANDY,RN @0037  06/13/18 [facility] CRITICAL RESULT CALLED TO, READ BACK BY AND VERIFIED WITH: T HANDY RN [phone] 06/13/18 A BROWNING    Culture (A)  Final    ESCHERICHIA COLI SUSCEPTIBILITIES TO FOLLOW Performed at [facility],  [address], [place], [place] [address]    Report Status PENDING  Incomplete  Blood Culture ID Panel (Reflexed)     Status: Abnormal   Collection Time: 06/12/18 10:16 AM  Result Value Ref Range Status   Enterococcus species NOT DETECTED NOT DETECTED Final   Listeria monocytogenes NOT DETECTED NOT DETECTED Final   Staphylococcus species NOT DETECTED NOT DETECTED Final   Staphylococcus aureus (BCID) NOT DETECTED NOT DETECTED Final   Streptococcus species NOT DETECTED NOT DETECTED Final   Streptococcus agalactiae NOT DETECTED NOT DETECTED Final   Streptococcus pneumoniae NOT DETECTED NOT DETECTED Final   Streptococcus pyogenes NOT DETECTED NOT DETECTED Final   Acinetobacter baumannii NOT DETECTED NOT DETECTED Final   Enterobacteriaceae species DETECTED (A) NOT DETECTED Final    Comment: Enterobacteriaceae represent a large family of gram-negative bacteria, not a single organism. CRITICAL RESULT CALLED TO, READ BACK BY AND VERIFIED WITH: T HANDY RN [phone] 06/13/18 A BROWNING    Enterobacter cloacae complex NOT DETECTED NOT DETECTED Final   Escherichia coli DETECTED (A) NOT DETECTED Final    Comment: CRITICAL RESULT CALLED  TO, READ BACK BY AND VERIFIED WITH: T HANDY RN [phone] 06/13/18 A BROWNING    Klebsiella oxytoca NOT DETECTED NOT DETECTED Final   Klebsiella pneumoniae NOT DETECTED NOT DETECTED Final   Proteus species NOT DETECTED NOT DETECTED Final   Serratia marcescens NOT DETECTED NOT DETECTED Final   Carbapenem resistance NOT DETECTED NOT DETECTED Final   Haemophilus influenzae NOT DETECTED NOT DETECTED Final   Neisseria meningitidis NOT DETECTED NOT DETECTED Final   Pseudomonas aeruginosa NOT DETECTED NOT DETECTED Final   Candida albicans NOT DETECTED NOT DETECTED Final   Candida glabrata NOT DETECTED NOT DETECTED Final   Candida krusei NOT DETECTED NOT DETECTED Final   Candida parapsilosis NOT DETECTED NOT DETECTED Final   Candida tropicalis NOT DETECTED NOT DETECTED Final    Comment: Performed at [facility], [place] [address], [place], [place] [address]  Blood Culture (routine x 2)     Status: None (Preliminary result)   Collection Time: 06/12/18 10:17 AM  Result Value Ref Range Status   Specimen Description   Final    BLOOD LEFT ANTECUBITAL DRAWN BY RN Performed at [facility], [address] [place], [place] [address]    Special Requests   Final    BOTTLES DRAWN AEROBIC AND ANAEROBIC Blood Culture adequate volume Performed at [facility], [address], [place], [place] [address]    Culture  Setup Time   Final    ANAEROBIC BOTTLE ONLY GRAM NEGATIVE RODS Gram Stain Report Called to,Read Back By and Verified With: C HANDY,RN @0037  06/13/18 [facility] Performed at [facility], [address], [place], [place] [address]    Culture GRAM NEGATIVE RODS  Final   Report Status PENDING  Incomplete         Radiology Studies: No results found.      Scheduled Meds: . aspirin  325 mg Oral Q breakfast  . feeding supplement (ENSURE ENLIVE)  237 mL Oral BID BM  . folic acid  1 mg Oral Daily  . heparin  5,000 Units Subcutaneous Q8H  . ipratropium-albuterol  3 mL Nebulization BID  .  pantoprazole  40 mg Oral BID  . tamsulosin  0.4 mg Oral Daily   Continuous Infusions: . ampicillin-sulbactam (UNASYN) IV 3 g (06/14/18 0755)  . azithromycin 500 mg (06/14/18 1309)     LOS: 2 days    Time spent:  66mins    Kathie Dike, MD [facility] Pager [phone]  If 7PM-7AM, please contact night-coverage [email or web address] Password [facility] 06/14/2018, 7:38 PM

## 2018-06-15 DIAGNOSIS — N179 Acute kidney failure, unspecified: Secondary | ICD-10-CM

## 2018-06-15 DIAGNOSIS — N184 Chronic kidney disease, stage 4 (severe): Secondary | ICD-10-CM

## 2018-06-15 DIAGNOSIS — N400 Enlarged prostate without lower urinary tract symptoms: Secondary | ICD-10-CM

## 2018-06-15 DIAGNOSIS — R6520 Severe sepsis without septic shock: Secondary | ICD-10-CM

## 2018-06-15 DIAGNOSIS — R1310 Dysphagia, unspecified: Secondary | ICD-10-CM

## 2018-06-15 DIAGNOSIS — D631 Anemia in chronic kidney disease: Secondary | ICD-10-CM

## 2018-06-15 DIAGNOSIS — A419 Sepsis, unspecified organism: Secondary | ICD-10-CM

## 2018-06-15 DIAGNOSIS — J690 Pneumonitis due to inhalation of food and vomit: Secondary | ICD-10-CM

## 2018-06-15 DIAGNOSIS — I10 Essential (primary) hypertension: Secondary | ICD-10-CM

## 2018-06-15 DIAGNOSIS — Z66 Do not resuscitate: Secondary | ICD-10-CM

## 2018-06-15 LAB — CBC
HCT: 28.8 % — ABNORMAL LOW (ref 39.0–52.0)
Hemoglobin: 8.3 g/dL — ABNORMAL LOW (ref 13.0–17.0)
MCH: 28 pg (ref 26.0–34.0)
MCHC: 28.8 g/dL — AB (ref 30.0–36.0)
MCV: 97.3 fL (ref 80.0–100.0)
Platelets: 149 10*3/uL — ABNORMAL LOW (ref 150–400)
RBC: 2.96 MIL/uL — ABNORMAL LOW (ref 4.22–5.81)
RDW: 15.2 % (ref 11.5–15.5)
WBC: 4.8 10*3/uL (ref 4.0–10.5)
nRBC: 0 % (ref 0.0–0.2)

## 2018-06-15 LAB — BASIC METABOLIC PANEL
Anion gap: 7 (ref 5–15)
BUN: 72 mg/dL — ABNORMAL HIGH (ref 8–23)
CALCIUM: 8.8 mg/dL — AB (ref 8.9–10.3)
CO2: 20 mmol/L — ABNORMAL LOW (ref 22–32)
Chloride: 120 mmol/L — ABNORMAL HIGH (ref 98–111)
Creatinine, Ser: 1.93 mg/dL — ABNORMAL HIGH (ref 0.61–1.24)
GFR calc Af Amer: 35 mL/min — ABNORMAL LOW (ref >60)
GFR, EST NON AFRICAN AMERICAN: 30 mL/min — AB (ref >60)
Glucose, Bld: 82 mg/dL (ref 70–99)
Potassium: 3.9 mmol/L (ref 3.5–5.1)
Sodium: 147 mmol/L — ABNORMAL HIGH (ref 135–145)

## 2018-06-15 LAB — CULTURE, BLOOD (ROUTINE X 2)
SPECIAL REQUESTS: ADEQUATE
Special Requests: ADEQUATE

## 2018-06-15 LAB — AEROBIC CULTURE W GRAM STAIN (SUPERFICIAL SPECIMEN)

## 2018-06-15 LAB — AEROBIC CULTURE  (SUPERFICIAL SPECIMEN)

## 2018-06-15 MED ORDER — PIPERACILLIN-TAZOBACTAM 3.375 G IVPB
3.3750 g | Freq: Three times a day (TID) | INTRAVENOUS | Status: DC
  Administered 2018-06-15 – 2018-06-16 (×3): 3.375 g via INTRAVENOUS
  Filled 2018-06-15 (×3): qty 50

## 2018-06-15 MED ORDER — ALPRAZOLAM 0.25 MG PO TABS
0.2500 mg | ORAL_TABLET | Freq: Once | ORAL | Status: AC
  Administered 2018-06-15: 0.25 mg via ORAL
  Filled 2018-06-15: qty 1

## 2018-06-15 MED ORDER — SODIUM CHLORIDE 0.45 % IV SOLN
INTRAVENOUS | Status: AC
  Administered 2018-06-15: 13:00:00 via INTRAVENOUS

## 2018-06-15 NOTE — Progress Notes (Signed)
PROGRESS NOTE    [name]  TWS:[id number] DOB: [birth year] DOA: 06/12/2018 PCP: Lemmie Evens, MD    Brief Narrative:  84 year old male with a history of dementia, previous stroke, dysphagia, failure to thrive, who is essentially been bedbound since fracturing his hip earlier this year.  Patient was brought to the hospital with increasing shortness of breath and cough.  He also became less responsive.  He was noted to have a fever at home as high as 103.  He was found to have possible aspiration pneumonia as well as dehydration and acute on chronic kidney disease.  Blood cultures have returned positive for gram-negative rods.  He is on IV antibiotics.   Assessment & Plan:   Active Problems:   HTN (hypertension)   Anemia   Dementia (HCC)   BPH (benign prostatic hyperplasia)   Encephalopathy   Sepsis ([place])   Acute renal failure superimposed on stage 4 chronic kidney disease ([place])   DNR (do not resuscitate)   Dysphagia   Aspiration pneumonia ([place])   Metabolic acidosis   Malnutrition of moderate degree   Pressure injury of skin   1. Sepsis. Clinically improving.  Patient found to have positive blood cultures with E coli.  Currently on intravenous Unasyn.  We will follow-up sensitivities.  Hemodynamics are stable.  Continue current treatments. 2. Aspiration pneumonia.  With history of dysphagia, aspiration pneumonia is likely.  He is currently on Unasyn.  Transition to oral meds on 12/23.   3. Acute renal failure.  Superimposed on chronic kidney disease stage IV.  Mild improvement with IV fluids.  4. Hypernatremia - add more free water.   5. Metabolic acidosis.  Related to renal failure.  Resolved with bicarbonate infusion 6. Acute encephalopathy, superimposed on baseline dementia.  Patient was increasingly lethargic on arrival.  Overall mental status appears to be improving with IV hydration.  7. BPH.  Continue Flomax. 8. Anemia of chronic kidney disease.  Hemoglobin has  trended down with hydration. No signs of bleeding.  Continue to follow  DVT prophylaxis: Heparin Code Status: DNR Family Communication: No family present Disposition Plan: Probable discharge home tomorrow    Consultants:     Procedures:     Antimicrobials:   Unasyn 12/19 >  Azithromycin 12/19 >12/21   Subjective: Patient denies any specific complaints.   Objective: Vitals:   06/14/18 1924 06/14/18 2053 06/15/18 0504 06/15/18 0753  BP:  (!) 138/95 (!) 166/75   Pulse:  82 83   Resp:  18 18   Temp:  97.6 F (36.4 C) 98.6 F (37 C)   TempSrc:  Oral Oral   SpO2: 94% 99% 100% 97%  Weight:      Height:        Intake/Output Summary (Last 24 hours) at 06/15/2018 1207 Last data filed at 06/15/2018 0515 Gross per 24 hour  Intake 830 ml  Output 1475 ml  Net -645 ml   Filed Weights   06/12/18 0928 06/12/18 1800  Weight: 74 kg 67.5 kg   Examination:  General exam: Alert, awake, no distress Respiratory system: BBS shallow with occasional rales. Respiratory effort normal. Cardiovascular system:normal s1,s2 sounds. No murmurs, rubs, gallops. Gastrointestinal system: Abdomen is nondistended, soft and nontender. No organomegaly or masses felt. Normal bowel sounds heard. Central nervous system: No focal neurological deficits. Extremities: trace pretibial bilaterally Skin: No rashes, lesions or ulcers Psychiatry: normal affect, Pleasant, limited due to hearing impairment   Data Reviewed: I have personally reviewed following labs and imaging  studies  CBC: Recent Labs  Lab 06/12/18 1012 06/13/18 0603 06/14/18 0722 06/15/18 0616  WBC 7.1 8.8 5.2 4.8  NEUTROABS 5.2  --   --   --   HGB 8.4* 9.8* 7.8* 8.3*  HCT 28.6* 33.1* 26.3* 28.8*  MCV 97.6 97.9 94.9 97.3  PLT 142* 147* 134* 630*   Basic Metabolic Panel: Recent Labs  Lab 06/12/18 1012 06/13/18 0603 06/14/18 0722 06/15/18 0616  NA 145 149* 149* 147*  K 4.0 3.7 3.6 3.9  CL 125* 125* 123* 120*  CO2 15*  17* 21* 20*  GLUCOSE 204* 111* 100* 82  BUN 116* 88* 84* 72*  CREATININE 2.59* 2.22* 2.14* 1.93*  CALCIUM 9.2 9.1 8.5* 8.8*   GFR: Estimated Creatinine Clearance: 24.8 mL/min (A) (by C-G formula based on SCr of 1.93 mg/dL (H)). Liver Function Tests: Recent Labs  Lab 06/12/18 1012 06/13/18 0603  AST 12* 14*  ALT 9 10  ALKPHOS 48 52  BILITOT 0.4 0.3  PROT 6.3* 6.1*  ALBUMIN 2.4* 2.3*   No results for input(s): LIPASE, AMYLASE in the last 168 hours. No results for input(s): AMMONIA in the last 168 hours. Coagulation Profile: Recent Labs  Lab 06/13/18 0603  INR 1.29   Cardiac Enzymes: No results for input(s): CKTOTAL, CKMB, CKMBINDEX, TROPONINI in the last 168 hours. BNP (last 3 results) No results for input(s): PROBNP in the last 8760 hours. HbA1C: No results for input(s): HGBA1C in the last 72 hours. CBG: No results for input(s): GLUCAP in the last 168 hours. Lipid Profile: No results for input(s): CHOL, HDL, LDLCALC, TRIG, CHOLHDL, LDLDIRECT in the last 72 hours. Thyroid Function Tests: No results for input(s): TSH, T4TOTAL, FREET4, T3FREE, THYROIDAB in the last 72 hours. Anemia Panel: No results for input(s): VITAMINB12, FOLATE, FERRITIN, TIBC, IRON, RETICCTPCT in the last 72 hours. Sepsis Labs: Recent Labs  Lab 06/12/18 1036 06/12/18 1233 06/13/18 0603  PROCALCITON  --   --  3.08  LATICACIDVEN 1.3 1.2  --     Recent Results (from the past 240 hour(s))  Wound or Superficial Culture     Status: None   Collection Time: 06/12/18 10:13 AM  Result Value Ref Range Status   Specimen Description   Final    PENIS Performed at [facility], [address] [place], [place] [address]    Special Requests   Final    NONE Performed at [facility], [address] [place], [place] [address]    Gram Stain   Final    RARE WBC PRESENT,BOTH PMN AND MONONUCLEAR MODERATE GRAM NEGATIVE RODS RARE GRAM POSITIVE COCCI Performed at [facility], [place] [address],  [place], [place] [address]    Culture   Final    MODERATE ESCHERICHIA COLI MODERATE PSEUDOMONAS AERUGINOSA    Report Status 06/15/2018 FINAL  Final   Organism ID, Bacteria ESCHERICHIA COLI  Final   Organism ID, Bacteria PSEUDOMONAS AERUGINOSA  Final      Susceptibility   Escherichia coli - MIC*    AMPICILLIN 8 SENSITIVE Sensitive     CEFAZOLIN <=4 SENSITIVE Sensitive     CEFEPIME <=1 SENSITIVE Sensitive     CEFTAZIDIME <=1 SENSITIVE Sensitive     CEFTRIAXONE <=1 SENSITIVE Sensitive     CIPROFLOXACIN <=0.25 SENSITIVE Sensitive     GENTAMICIN <=1 SENSITIVE Sensitive     IMIPENEM <=0.25 SENSITIVE Sensitive     TRIMETH/SULFA <=20 SENSITIVE Sensitive     AMPICILLIN/SULBACTAM 4 SENSITIVE Sensitive     PIP/TAZO <=  4 SENSITIVE Sensitive     Extended ESBL NEGATIVE Sensitive     * MODERATE ESCHERICHIA COLI   Pseudomonas aeruginosa - MIC*    CEFTAZIDIME 2 SENSITIVE Sensitive     CIPROFLOXACIN <=0.25 SENSITIVE Sensitive     GENTAMICIN <=1 SENSITIVE Sensitive     IMIPENEM 1 SENSITIVE Sensitive     PIP/TAZO <=4 SENSITIVE Sensitive     CEFEPIME <=1 SENSITIVE Sensitive     * MODERATE PSEUDOMONAS AERUGINOSA  Blood Culture (routine x 2)     Status: Abnormal   Collection Time: 06/12/18 10:16 AM  Result Value Ref Range Status   Specimen Description   Final    BLOOD RIGHT WRIST DRAWN BY RN Performed at [facility], [address] [place], [place] [address]    Special Requests   Final    BOTTLES DRAWN AEROBIC AND ANAEROBIC Blood Culture adequate volume Performed at [facility], [address] [place], [place] [address]    Culture  Setup Time   Final    ANAEROBIC BOTTLE ONLY GRAM NEGATIVE RODS Gram Stain Report Called to,Read Back By and Verified With: C HANDY,RN @0037  06/13/18 [facility] CRITICAL RESULT CALLED TO, READ BACK BY AND VERIFIED WITH: T HANDY RN [phone] 06/13/18 A BROWNING Performed at [facility], [place] [address] [place] [address] [place], [place] [address]    Culture ESCHERICHIA COLI (A)  Final    Report Status 06/15/2018 FINAL  Final   Organism ID, Bacteria ESCHERICHIA COLI  Final      Susceptibility   Escherichia coli - MIC*    AMPICILLIN <=2 SENSITIVE Sensitive     CEFAZOLIN <=4 SENSITIVE Sensitive     CEFEPIME <=1 SENSITIVE Sensitive     CEFTAZIDIME <=1 SENSITIVE Sensitive     CEFTRIAXONE <=1 SENSITIVE Sensitive     CIPROFLOXACIN <=0.25 SENSITIVE Sensitive     GENTAMICIN <=1 SENSITIVE Sensitive     IMIPENEM <=0.25 SENSITIVE Sensitive     TRIMETH/SULFA <=20 SENSITIVE Sensitive     AMPICILLIN/SULBACTAM <=2 SENSITIVE Sensitive     PIP/TAZO <=4 SENSITIVE Sensitive     Extended ESBL NEGATIVE Sensitive     * ESCHERICHIA COLI  Blood Culture ID Panel (Reflexed)     Status: Abnormal   Collection Time: 06/12/18 10:16 AM  Result Value Ref Range Status   Enterococcus species NOT DETECTED NOT DETECTED Final   Listeria monocytogenes NOT DETECTED NOT DETECTED Final   Staphylococcus species NOT DETECTED NOT DETECTED Final   Staphylococcus aureus (BCID) NOT DETECTED NOT DETECTED Final   Streptococcus species NOT DETECTED NOT DETECTED Final   Streptococcus agalactiae NOT DETECTED NOT DETECTED Final   Streptococcus pneumoniae NOT DETECTED NOT DETECTED Final   Streptococcus pyogenes NOT DETECTED NOT DETECTED Final   Acinetobacter baumannii NOT DETECTED NOT DETECTED Final   Enterobacteriaceae species DETECTED (A) NOT DETECTED Final    Comment: Enterobacteriaceae represent a large family of gram-negative bacteria, not a single organism. CRITICAL RESULT CALLED TO, READ BACK BY AND VERIFIED WITH: T HANDY RN [phone] 06/13/18 A BROWNING    Enterobacter cloacae complex NOT DETECTED NOT DETECTED Final   Escherichia coli DETECTED (A) NOT DETECTED Final    Comment: CRITICAL RESULT CALLED TO, READ BACK BY AND VERIFIED WITH: T HANDY RN [phone] 06/13/18 A BROWNING    Klebsiella oxytoca NOT DETECTED NOT DETECTED Final   Klebsiella pneumoniae NOT DETECTED NOT DETECTED Final   Proteus species NOT DETECTED  NOT DETECTED Final   Serratia marcescens NOT DETECTED NOT DETECTED Final   Carbapenem resistance  NOT DETECTED NOT DETECTED Final   Haemophilus influenzae NOT DETECTED NOT DETECTED Final   Neisseria meningitidis NOT DETECTED NOT DETECTED Final   Pseudomonas aeruginosa NOT DETECTED NOT DETECTED Final   Candida albicans NOT DETECTED NOT DETECTED Final   Candida glabrata NOT DETECTED NOT DETECTED Final   Candida krusei NOT DETECTED NOT DETECTED Final   Candida parapsilosis NOT DETECTED NOT DETECTED Final   Candida tropicalis NOT DETECTED NOT DETECTED Final    Comment: Performed at [facility], [place] [address], [place], [place] [address]  Blood Culture (routine x 2)     Status: Abnormal   Collection Time: 06/12/18 10:17 AM  Result Value Ref Range Status   Specimen Description   Final    BLOOD LEFT ANTECUBITAL DRAWN BY RN Performed at [facility], [address] [place], [place] [address]    Special Requests   Final    BOTTLES DRAWN AEROBIC AND ANAEROBIC Blood Culture adequate volume Performed at [facility], [address] [place], [place] [address]    Culture  Setup Time   Final    ANAEROBIC BOTTLE ONLY GRAM NEGATIVE RODS Gram Stain Report Called to,Read Back By and Verified With: C HANDY,RN @0037  06/13/18 [facility] Performed at [facility], [address], [place], [place] [address]    Culture (A)  Final    ESCHERICHIA COLI SUSCEPTIBILITIES PERFORMED ON PREVIOUS CULTURE WITHIN THE LAST 5 DAYS. Performed at [facility], [place] [address] [place], [place] [address]    Report Status 06/15/2018 FINAL  Final    Radiology Studies: No results found.  Scheduled Meds: . aspirin  325 mg Oral Q breakfast  . feeding supplement (ENSURE ENLIVE)  237 mL Oral BID BM  . folic acid  1 mg Oral Daily  . heparin  5,000 Units Subcutaneous Q8H  . ipratropium-albuterol  3 mL Nebulization BID  . pantoprazole  40 mg Oral BID  . tamsulosin  0.4 mg Oral Daily   Continuous Infusions: .  ampicillin-sulbactam (UNASYN) IV 3 g (06/14/18 2050)     LOS: 3 days   Time spent: 30 mins   Wynetta Emery, MD [facility]  If 7PM-7AM, please contact night-coverage [email or web address] Password [facility] 06/15/2018, 12:07 PM

## 2018-06-16 DIAGNOSIS — E440 Moderate protein-calorie malnutrition: Secondary | ICD-10-CM

## 2018-06-16 DIAGNOSIS — E872 Acidosis: Secondary | ICD-10-CM

## 2018-06-16 DIAGNOSIS — G9340 Encephalopathy, unspecified: Secondary | ICD-10-CM

## 2018-06-16 DIAGNOSIS — F0390 Unspecified dementia without behavioral disturbance: Secondary | ICD-10-CM

## 2018-06-16 LAB — URINALYSIS, ROUTINE W REFLEX MICROSCOPIC
BILIRUBIN URINE: NEGATIVE
Glucose, UA: NEGATIVE mg/dL
Ketones, ur: NEGATIVE mg/dL
NITRITE: NEGATIVE
PH: 5 (ref 5.0–8.0)
Protein, ur: NEGATIVE mg/dL
Specific Gravity, Urine: 1.012 (ref 1.005–1.030)

## 2018-06-16 LAB — BASIC METABOLIC PANEL
Anion gap: 6 (ref 5–15)
BUN: 60 mg/dL — AB (ref 8–23)
CO2: 21 mmol/L — ABNORMAL LOW (ref 22–32)
Calcium: 8.8 mg/dL — ABNORMAL LOW (ref 8.9–10.3)
Chloride: 116 mmol/L — ABNORMAL HIGH (ref 98–111)
Creatinine, Ser: 1.86 mg/dL — ABNORMAL HIGH (ref 0.61–1.24)
GFR calc Af Amer: 36 mL/min — ABNORMAL LOW (ref >60)
GFR calc non Af Amer: 31 mL/min — ABNORMAL LOW (ref >60)
Glucose, Bld: 80 mg/dL (ref 70–99)
Potassium: 4 mmol/L (ref 3.5–5.1)
SODIUM: 143 mmol/L (ref 135–145)

## 2018-06-16 MED ORDER — SENNOSIDES-DOCUSATE SODIUM 8.6-50 MG PO TABS: 1.0000 | ORAL_TABLET | Freq: Every evening | ORAL | Status: AC | PRN

## 2018-06-16 MED ORDER — CIPROFLOXACIN HCL 250 MG PO TABS: 250.0000 mg | ORAL_TABLET | Freq: Two times a day (BID) | ORAL | 0 refills | Status: AC

## 2018-06-16 MED ORDER — POLYETHYLENE GLYCOL 3350 17 G PO PACK: 17.0000 g | PACK | Freq: Every day | ORAL | Status: AC | PRN

## 2018-06-16 NOTE — Discharge Summary (Signed)
Physician Discharge Summary  [name]:[id number] DOB: [birth year] DOA: 06/12/2018  PCP: Lemmie Evens, MD  Admit date: 06/12/2018 Discharge date: 06/16/2018  Admitted From: Home  Disposition: Home with [facility] services  Recommendations for Outpatient Follow-up:  1. Follow up with PCP in 1 weeks  Discharge Condition: STABLE   CODE STATUS: DNR    Brief Hospitalization Summary: Please see all hospital notes, images, labs for full details of the hospitalization.  HPI: [name] is a 84 y.o. male with medical history significant of dementia, previous stroke, dysphasia, failure to thrive, who is been essentially bedbound since fracturing his hip earlier this year..  Patient was discharged from the hospital on 05/03/2018 after being treated for sepsis, encephalopathy and acute on chronic kidney disease.  At that time, he was noted to be high risk for aspiration.  Family reports that he was doing relatively well since his discharge.  Since yesterday, patient has had increasing shortness of breath and cough.  He has been less responsive.  Overnight, he developed significant fever as high as 103.  Today, he has been increasingly lethargic.  He is not had any vomiting or diarrhea.  Family feels that his urine is more cloudy.  ED Course: He was evaluated in the emergency room where he was noted to be tachycardic.  BUN and creatinine are above his baseline.  He is noted to be anemic.  Chest x-ray indicates possible pneumonia. Brief Narrative:  84 year old male with a history of dementia, previous stroke, dysphagia, failure to thrive, who is essentially been bedbound since fracturing his hip earlier this year.  Patient was brought to the hospital with increasing shortness of breath and cough.  He also became less responsive.  He was noted to have a fever at home as high as 103.  He was found to have possible aspiration pneumonia as well as dehydration and acute on chronic kidney disease.  Blood  cultures have returned positive for gram-negative rods.  He is on IV antibiotics.  Assessment & Plan:   Active Problems:   HTN (hypertension)   Anemia   Dementia (HCC)   BPH (benign prostatic hyperplasia)   Encephalopathy   Sepsis ([place])   Acute renal failure superimposed on stage 4 chronic kidney disease ([place])   DNR (do not resuscitate)   Dysphagia   Aspiration pneumonia ([place])   Metabolic acidosis   Malnutrition of moderate degree   Pressure injury of skin   1. E coli Sepsis. Clinically improving.  Patient found to have positive blood cultures with E coli.  Currently on intravenous Unasyn/Zosyn.   Hemodynamics are stable.  Continue current treatments. 2. Aspiration pneumonia.  With history of dysphagia, aspiration pneumonia is likely.  He was treated with Unasyn/Zosyn.  Home Health SLP ordered.  3. Acute renal failure.  Superimposed on chronic kidney disease stage IV.  Mild improvement with IV fluids.  GFR 31.  4. Hypernatremia - resolved after adding more free water.  Discontinued torsemide as patient was severely dehydrated when he was admitted.  He has responded well to IV fluid hydration.   5. Metabolic acidosis.  Related to renal failure.  Resolved with bicarbonate infusion 6. Acute encephalopathy, superimposed on baseline dementia.  Resolved now.  Patient was increasingly lethargic on arrival.  Overall mental status appears to be improving with IV hydration.  7. BPH.  Continue Flomax. 8. Anemia of chronic kidney disease.  Hemoglobin has trended down with hydration. No signs of bleeding.  Continue to follow  DVT  prophylaxis: Heparin Code Status: DNR Family Communication: No family present Disposition Plan: Home family declined any type of placement, did order home health services for patient, including SLP and social work.   Discharge Diagnoses:  Active Problems:   HTN (hypertension)   Anemia   Dementia (HCC)   BPH (benign prostatic hyperplasia)   Encephalopathy    Sepsis ([place])   Acute renal failure superimposed on stage 4 chronic kidney disease (HCC)   DNR (do not resuscitate)   Dysphagia   Aspiration pneumonia (HCC)   Metabolic acidosis   Malnutrition of moderate degree   Pressure injury of skin  Discharge Instructions: Discharge Instructions    Call MD for:  difficulty breathing, headache or visual disturbances   Complete by:  As directed    Call MD for:  extreme fatigue   Complete by:  As directed    Call MD for:  persistant dizziness or light-headedness   Complete by:  As directed    Call MD for:  persistant nausea and vomiting   Complete by:  As directed    Call MD for:  severe uncontrolled pain   Complete by:  As directed    Increase activity slowly   Complete by:  As directed      Allergies as of 06/16/2018   No Known Allergies     Medication List    STOP taking these medications   torsemide 20 MG tablet Commonly known as:  DEMADEX     TAKE these medications   aspirin 325 MG EC tablet Take 1 tablet (325 mg total) by mouth daily with breakfast.   CALCIUM 600+D 600-400 MG-UNIT tablet Generic drug:  Calcium Carbonate-Vitamin D Take 1 tablet by mouth 2 (two) times daily.   ciprofloxacin 250 MG tablet Commonly known as:  CIPRO Take 1 tablet (250 mg total) by mouth 2 (two) times daily for 7 days.   feeding supplement (GLUCERNA SHAKE) Liqd Take 237 mLs by mouth 3 (three) times daily between meals.   ferrous sulfate 325 (65 FE) MG tablet Take 1 tablet (325 mg total) by mouth 2 (two) times daily with a meal.   folic acid 1 MG tablet Commonly known as:  FOLVITE Take 1 tablet (1 mg total) by mouth daily.   pantoprazole 40 MG tablet Commonly known as:  PROTONIX Take 40 mg by mouth 2 (two) times daily.   polyethylene glycol packet Commonly known as:  MIRALAX / GLYCOLAX Take 17 g by mouth daily as needed for mild constipation or moderate constipation.   senna-docusate 8.6-50 MG tablet Commonly known as:   Senokot-S Take 1 tablet by mouth at bedtime as needed for mild constipation or moderate constipation.   tamsulosin 0.4 MG Caps capsule Commonly known as:  FLOMAX Take 1 capsule by mouth daily.      Follow-up Information    Lemmie Evens, MD. Schedule an appointment as soon as possible for a visit in 1 week(s).   Specialty:  Family Medicine Why:  Hospital Follow Up  Contact information: [place] [address] [phone]          No Known Allergies Allergies as of 06/16/2018   No Known Allergies     Medication List    STOP taking these medications   torsemide 20 MG tablet Commonly known as:  DEMADEX     TAKE these medications   aspirin 325 MG EC tablet Take 1 tablet (325 mg total) by mouth daily with breakfast.   CALCIUM 600+D  600-400 MG-UNIT tablet Generic drug:  Calcium Carbonate-Vitamin D Take 1 tablet by mouth 2 (two) times daily.   ciprofloxacin 250 MG tablet Commonly known as:  CIPRO Take 1 tablet (250 mg total) by mouth 2 (two) times daily for 7 days.   feeding supplement (GLUCERNA SHAKE) Liqd Take 237 mLs by mouth 3 (three) times daily between meals.   ferrous sulfate 325 (65 FE) MG tablet Take 1 tablet (325 mg total) by mouth 2 (two) times daily with a meal.   folic acid 1 MG tablet Commonly known as:  FOLVITE Take 1 tablet (1 mg total) by mouth daily.   pantoprazole 40 MG tablet Commonly known as:  PROTONIX Take 40 mg by mouth 2 (two) times daily.   polyethylene glycol packet Commonly known as:  MIRALAX / GLYCOLAX Take 17 g by mouth daily as needed for mild constipation or moderate constipation.   senna-docusate 8.6-50 MG tablet Commonly known as:  Senokot-S Take 1 tablet by mouth at bedtime as needed for mild constipation or moderate constipation.   tamsulosin 0.4 MG Caps capsule Commonly known as:  FLOMAX Take 1 capsule by mouth daily.       Procedures/Studies: Ct Head Wo Contrast  Result Date:  05/29/2018 CLINICAL DATA:  Altered level of consciousness EXAM: CT HEAD WITHOUT CONTRAST TECHNIQUE: Contiguous axial images were obtained from the base of the skull through the vertex without intravenous contrast. COMPARISON:  04/23/2018 FINDINGS: Brain: There is atrophy and chronic small vessel disease changes. Old right frontal infarct. No acute intracranial abnormality. Specifically, no hemorrhage, hydrocephalus, mass lesion, acute infarction, or significant intracranial injury. Vascular: No hyperdense vessel or unexpected calcification. Skull: No acute calvarial abnormality. Sinuses/Orbits: Visualized paranasal sinuses and mastoids clear. Orbital soft tissues unremarkable. Other: None IMPRESSION: Old right frontal infarct. No acute intracranial abnormality. Atrophy, chronic microvascular disease. Electronically Signed   By: Rolm Baptise M.D.   On: 05/29/2018 23:15   Dg Chest Port 1 View  Result Date: 06/12/2018 CLINICAL DATA:  Sepsis EXAM: PORTABLE CHEST 1 VIEW COMPARISON:  05/01/2018 FINDINGS: Heart is borderline in size. Left lower lobe atelectasis or infiltrate with probable small left effusion. Findings similar to prior study. Right lung clear. No acute bony abnormality. IMPRESSION: Small left effusion with left lower lobe atelectasis or infiltrate, similar to prior study. Electronically Signed   By: Rolm Baptise M.D.   On: 06/12/2018 10:42      Subjective: Pt without complaints.  He is awake and much more alert and he has been eating and drinking without difficulty today.    Discharge Exam: Vitals:   06/16/18 0531 06/16/18 0802  BP: (!) 168/64   Pulse: 88   Resp: (!) 24   Temp: 98.1 F (36.7 C)   SpO2: 99% 94%   Vitals:   06/15/18 2032 06/15/18 2147 06/16/18 0531 06/16/18 0802  BP:  (!) 155/77 (!) 168/64   Pulse: 83 87 88   Resp: 18 20 (!) 24   Temp:  98.5 F (36.9 C) 98.1 F (36.7 C)   TempSrc:   Oral   SpO2: 98% 99% 99% 94%  Weight:      Height:       General exam:  Alert, awake, no distress Respiratory system: BBS shallow with occasional rales. Respiratory effort normal. Cardiovascular system:normal s1,s2 sounds. No murmurs, rubs, gallops. Gastrointestinal system: Abdomen is nondistended, soft and nontender. No organomegaly or masses felt. Normal bowel sounds heard. Central nervous system: No focal neurological deficits. Extremities: no pretibial edema.  Skin: No rashes, lesions or ulcers Psychiatry: normal affect, Pleasant, limited due to hearing impairment   The results of significant diagnostics from this hospitalization (including imaging, microbiology, ancillary and laboratory) are listed below for reference.     Microbiology: Recent Results (from the past 240 hour(s))  Wound or Superficial Culture     Status: None   Collection Time: 06/12/18 10:13 AM  Result Value Ref Range Status   Specimen Description   Final    PENIS Performed at [facility], [address] [place], [place] [address]    Special Requests   Final    NONE Performed at [facility], [address] [place], [place] [address]    Gram Stain   Final    RARE WBC PRESENT,BOTH PMN AND MONONUCLEAR MODERATE GRAM NEGATIVE RODS RARE GRAM POSITIVE COCCI Performed at [facility], [place] [address] [place], [place] [address]    Culture   Final    MODERATE ESCHERICHIA COLI MODERATE PSEUDOMONAS AERUGINOSA    Report Status 06/15/2018 FINAL  Final   Organism ID, Bacteria ESCHERICHIA COLI  Final   Organism ID, Bacteria PSEUDOMONAS AERUGINOSA  Final      Susceptibility   Escherichia coli - MIC*    AMPICILLIN 8 SENSITIVE Sensitive     CEFAZOLIN <=4 SENSITIVE Sensitive     CEFEPIME <=1 SENSITIVE Sensitive     CEFTAZIDIME <=1 SENSITIVE Sensitive     CEFTRIAXONE <=1 SENSITIVE Sensitive     CIPROFLOXACIN <=0.25 SENSITIVE Sensitive     GENTAMICIN <=1 SENSITIVE Sensitive     IMIPENEM <=0.25 SENSITIVE Sensitive     TRIMETH/SULFA <=20 SENSITIVE Sensitive     AMPICILLIN/SULBACTAM  4 SENSITIVE Sensitive     PIP/TAZO <=4 SENSITIVE Sensitive     Extended ESBL NEGATIVE Sensitive     * MODERATE ESCHERICHIA COLI   Pseudomonas aeruginosa - MIC*    CEFTAZIDIME 2 SENSITIVE Sensitive     CIPROFLOXACIN <=0.25 SENSITIVE Sensitive     GENTAMICIN <=1 SENSITIVE Sensitive     IMIPENEM 1 SENSITIVE Sensitive     PIP/TAZO <=4 SENSITIVE Sensitive     CEFEPIME <=1 SENSITIVE Sensitive     * MODERATE PSEUDOMONAS AERUGINOSA  Blood Culture (routine x 2)     Status: Abnormal   Collection Time: 06/12/18 10:16 AM  Result Value Ref Range Status   Specimen Description   Final    BLOOD RIGHT WRIST DRAWN BY RN Performed at [facility], [address] [place], [place] [address]    Special Requests   Final    BOTTLES DRAWN AEROBIC AND ANAEROBIC Blood Culture adequate volume Performed at [facility], [address], [place], [place] [address]    Culture  Setup Time   Final    ANAEROBIC BOTTLE ONLY GRAM NEGATIVE RODS Gram Stain Report Called to,Read Back By and Verified With: C HANDY,RN @0037  06/13/18 MKELLY CRITICAL RESULT CALLED TO, READ BACK BY AND VERIFIED WITH: T HANDY RN [phone] 06/13/18 A BROWNING Performed at [facility], [address] [place], [place] [address]    Culture ESCHERICHIA COLI (A)  Final   Report Status 06/15/2018 FINAL  Final   Organism ID, Bacteria ESCHERICHIA COLI  Final      Susceptibility   Escherichia coli - MIC*    AMPICILLIN <=2 SENSITIVE Sensitive     CEFAZOLIN <=4 SENSITIVE Sensitive     CEFEPIME <=1 SENSITIVE Sensitive     CEFTAZIDIME <=1 SENSITIVE Sensitive     CEFTRIAXONE <=1 SENSITIVE Sensitive  CIPROFLOXACIN <=0.25 SENSITIVE Sensitive     GENTAMICIN <=1 SENSITIVE Sensitive     IMIPENEM <=0.25 SENSITIVE Sensitive     TRIMETH/SULFA <=20 SENSITIVE Sensitive     AMPICILLIN/SULBACTAM <=2 SENSITIVE Sensitive     PIP/TAZO <=4 SENSITIVE Sensitive     Extended ESBL NEGATIVE Sensitive     * ESCHERICHIA COLI  Blood Culture ID Panel (Reflexed)      Status: Abnormal   Collection Time: 06/12/18 10:16 AM  Result Value Ref Range Status   Enterococcus species NOT DETECTED NOT DETECTED Final   Listeria monocytogenes NOT DETECTED NOT DETECTED Final   Staphylococcus species NOT DETECTED NOT DETECTED Final   Staphylococcus aureus (BCID) NOT DETECTED NOT DETECTED Final   Streptococcus species NOT DETECTED NOT DETECTED Final   Streptococcus agalactiae NOT DETECTED NOT DETECTED Final   Streptococcus pneumoniae NOT DETECTED NOT DETECTED Final   Streptococcus pyogenes NOT DETECTED NOT DETECTED Final   Acinetobacter baumannii NOT DETECTED NOT DETECTED Final   Enterobacteriaceae species DETECTED (A) NOT DETECTED Final    Comment: Enterobacteriaceae represent a large family of gram-negative bacteria, not a single organism. CRITICAL RESULT CALLED TO, READ BACK BY AND VERIFIED WITH: T HANDY RN [phone] 06/13/18 A BROWNING    Enterobacter cloacae complex NOT DETECTED NOT DETECTED Final   Escherichia coli DETECTED (A) NOT DETECTED Final    Comment: CRITICAL RESULT CALLED TO, READ BACK BY AND VERIFIED WITH: T HANDY RN [phone] 06/13/18 A BROWNING    Klebsiella oxytoca NOT DETECTED NOT DETECTED Final   Klebsiella pneumoniae NOT DETECTED NOT DETECTED Final   Proteus species NOT DETECTED NOT DETECTED Final   Serratia marcescens NOT DETECTED NOT DETECTED Final   Carbapenem resistance NOT DETECTED NOT DETECTED Final   Haemophilus influenzae NOT DETECTED NOT DETECTED Final   Neisseria meningitidis NOT DETECTED NOT DETECTED Final   Pseudomonas aeruginosa NOT DETECTED NOT DETECTED Final   Candida albicans NOT DETECTED NOT DETECTED Final   Candida glabrata NOT DETECTED NOT DETECTED Final   Candida krusei NOT DETECTED NOT DETECTED Final   Candida parapsilosis NOT DETECTED NOT DETECTED Final   Candida tropicalis NOT DETECTED NOT DETECTED Final    Comment: Performed at [facility], [place] [address], [place], [place] [address]  Blood Culture (routine x 2)      Status: Abnormal   Collection Time: 06/12/18 10:17 AM  Result Value Ref Range Status   Specimen Description   Final    BLOOD LEFT ANTECUBITAL DRAWN BY RN Performed at [facility], [address] [place], [place] [address]    Special Requests   Final    BOTTLES DRAWN AEROBIC AND ANAEROBIC Blood Culture adequate volume Performed at [facility], [address] [place], [place] [address]    Culture  Setup Time   Final    ANAEROBIC BOTTLE ONLY GRAM NEGATIVE RODS Gram Stain Report Called to,Read Back By and Verified With: C HANDY,RN @0037  06/13/18 [facility] Performed at [facility], [address] [place], [place] [address]    Culture (A)  Final    ESCHERICHIA COLI SUSCEPTIBILITIES PERFORMED ON PREVIOUS CULTURE WITHIN THE LAST 5 DAYS. Performed at [facility], [place] [address] [place], [place] [address]    Report Status 06/15/2018 FINAL  Final     Labs: BNP (last 3 results) No results for input(s): BNP in the last 8760 hours. Basic Metabolic Panel: Recent Labs  Lab 06/12/18 1012 06/13/18 0603 06/14/18 0722 06/15/18 0616 06/16/18 0604  NA 145 149* 149* 147*  143  K 4.0 3.7 3.6 3.9 4.0  CL 125* 125* 123* 120* 116*  CO2 15* 17* 21* 20* 21*  GLUCOSE 204* 111* 100* 82 80  BUN 116* 88* 84* 72* 60*  CREATININE 2.59* 2.22* 2.14* 1.93* 1.86*  CALCIUM 9.2 9.1 8.5* 8.8* 8.8*   Liver Function Tests: Recent Labs  Lab 06/12/18 1012 06/13/18 0603  AST 12* 14*  ALT 9 10  ALKPHOS 48 52  BILITOT 0.4 0.3  PROT 6.3* 6.1*  ALBUMIN 2.4* 2.3*   No results for input(s): LIPASE, AMYLASE in the last 168 hours. No results for input(s): AMMONIA in the last 168 hours. CBC: Recent Labs  Lab 06/12/18 1012 06/13/18 0603 06/14/18 0722 06/15/18 0616  WBC 7.1 8.8 5.2 4.8  NEUTROABS 5.2  --   --   --   HGB 8.4* 9.8* 7.8* 8.3*  HCT 28.6* 33.1* 26.3* 28.8*  MCV 97.6 97.9 94.9 97.3  PLT 142* 147* 134* 149*   Cardiac Enzymes: No results for input(s): CKTOTAL, CKMB, CKMBINDEX,  TROPONINI in the last 168 hours. BNP: Invalid input(s): POCBNP CBG: No results for input(s): GLUCAP in the last 168 hours. D-Dimer No results for input(s): DDIMER in the last 72 hours. Hgb A1c No results for input(s): HGBA1C in the last 72 hours. Lipid Profile No results for input(s): CHOL, HDL, LDLCALC, TRIG, CHOLHDL, LDLDIRECT in the last 72 hours. Thyroid function studies No results for input(s): TSH, T4TOTAL, T3FREE, THYROIDAB in the last 72 hours.  Invalid input(s): FREET3 Anemia work up No results for input(s): VITAMINB12, FOLATE, FERRITIN, TIBC, IRON, RETICCTPCT in the last 72 hours. Urinalysis    Component Value Date/Time   COLORURINE STRAW (A) 06/16/2018 0339   APPEARANCEUR CLEAR 06/16/2018 0339   LABSPEC 1.012 06/16/2018 0339   PHURINE 5.0 06/16/2018 0339   GLUCOSEU NEGATIVE 06/16/2018 0339   HGBUR SMALL (A) 06/16/2018 0339   BILIRUBINUR NEGATIVE 06/16/2018 0339   KETONESUR NEGATIVE 06/16/2018 0339   PROTEINUR NEGATIVE 06/16/2018 0339   UROBILINOGEN 0.2 11/28/2012 1626   NITRITE NEGATIVE 06/16/2018 0339   LEUKOCYTESUR TRACE (A) 06/16/2018 0339   Sepsis Labs Invalid input(s): PROCALCITONIN,  WBC,  LACTICIDVEN Microbiology Recent Results (from the past 240 hour(s))  Wound or Superficial Culture     Status: None   Collection Time: 06/12/18 10:13 AM  Result Value Ref Range Status   Specimen Description   Final    PENIS Performed at [facility], [address], [place], [place] [address]    Special Requests   Final    NONE Performed at [facility], [address], [place], [place] [address]    Gram Stain   Final    RARE WBC PRESENT,BOTH PMN AND MONONUCLEAR MODERATE GRAM NEGATIVE RODS RARE GRAM POSITIVE COCCI Performed at [facility], [place] [address], [place], [place] [address]    Culture   Final    MODERATE ESCHERICHIA COLI MODERATE PSEUDOMONAS AERUGINOSA    Report Status 06/15/2018 FINAL  Final   Organism ID, Bacteria ESCHERICHIA COLI  Final    Organism ID, Bacteria PSEUDOMONAS AERUGINOSA  Final      Susceptibility   Escherichia coli - MIC*    AMPICILLIN 8 SENSITIVE Sensitive     CEFAZOLIN <=4 SENSITIVE Sensitive     CEFEPIME <=1 SENSITIVE Sensitive     CEFTAZIDIME <=1 SENSITIVE Sensitive     CEFTRIAXONE <=1 SENSITIVE Sensitive     CIPROFLOXACIN <=0.25 SENSITIVE Sensitive     GENTAMICIN <=1 SENSITIVE Sensitive     IMIPENEM <=  0.25 SENSITIVE Sensitive     TRIMETH/SULFA <=20 SENSITIVE Sensitive     AMPICILLIN/SULBACTAM 4 SENSITIVE Sensitive     PIP/TAZO <=4 SENSITIVE Sensitive     Extended ESBL NEGATIVE Sensitive     * MODERATE ESCHERICHIA COLI   Pseudomonas aeruginosa - MIC*    CEFTAZIDIME 2 SENSITIVE Sensitive     CIPROFLOXACIN <=0.25 SENSITIVE Sensitive     GENTAMICIN <=1 SENSITIVE Sensitive     IMIPENEM 1 SENSITIVE Sensitive     PIP/TAZO <=4 SENSITIVE Sensitive     CEFEPIME <=1 SENSITIVE Sensitive     * MODERATE PSEUDOMONAS AERUGINOSA  Blood Culture (routine x 2)     Status: Abnormal   Collection Time: 06/12/18 10:16 AM  Result Value Ref Range Status   Specimen Description   Final    BLOOD RIGHT WRIST DRAWN BY RN Performed at [facility], [address], [place], [place] [address]    Special Requests   Final    BOTTLES DRAWN AEROBIC AND ANAEROBIC Blood Culture adequate volume Performed at [facility], [address] [place], [place] [address]    Culture  Setup Time   Final    ANAEROBIC BOTTLE ONLY GRAM NEGATIVE RODS Gram Stain Report Called to,Read Back By and Verified With: C HANDY,RN @0037  06/13/18 MKELLY CRITICAL RESULT CALLED TO, READ BACK BY AND VERIFIED WITH: T HANDY RN [phone] 06/13/18 A BROWNING Performed at [facility], [place] [address] , [place] [address]    Culture ESCHERICHIA COLI (A)  Final   Report Status 06/15/2018 FINAL  Final   Organism ID, Bacteria ESCHERICHIA COLI  Final      Susceptibility   Escherichia coli - MIC*    AMPICILLIN <=2 SENSITIVE Sensitive     CEFAZOLIN <=4  SENSITIVE Sensitive     CEFEPIME <=1 SENSITIVE Sensitive     CEFTAZIDIME <=1 SENSITIVE Sensitive     CEFTRIAXONE <=1 SENSITIVE Sensitive     CIPROFLOXACIN <=0.25 SENSITIVE Sensitive     GENTAMICIN <=1 SENSITIVE Sensitive     IMIPENEM <=0.25 SENSITIVE Sensitive     TRIMETH/SULFA <=20 SENSITIVE Sensitive     AMPICILLIN/SULBACTAM <=2 SENSITIVE Sensitive     PIP/TAZO <=4 SENSITIVE Sensitive     Extended ESBL NEGATIVE Sensitive     * ESCHERICHIA COLI  Blood Culture ID Panel (Reflexed)     Status: Abnormal   Collection Time: 06/12/18 10:16 AM  Result Value Ref Range Status   Enterococcus species NOT DETECTED NOT DETECTED Final   Listeria monocytogenes NOT DETECTED NOT DETECTED Final   Staphylococcus species NOT DETECTED NOT DETECTED Final   Staphylococcus aureus (BCID) NOT DETECTED NOT DETECTED Final   Streptococcus species NOT DETECTED NOT DETECTED Final   Streptococcus agalactiae NOT DETECTED NOT DETECTED Final   Streptococcus pneumoniae NOT DETECTED NOT DETECTED Final   Streptococcus pyogenes NOT DETECTED NOT DETECTED Final   Acinetobacter baumannii NOT DETECTED NOT DETECTED Final   Enterobacteriaceae species DETECTED (A) NOT DETECTED Final    Comment: Enterobacteriaceae represent a large family of gram-negative bacteria, not a single organism. CRITICAL RESULT CALLED TO, READ BACK BY AND VERIFIED WITH: T HANDY RN [phone] 06/13/18 A BROWNING    Enterobacter cloacae complex NOT DETECTED NOT DETECTED Final   Escherichia coli DETECTED (A) NOT DETECTED Final    Comment: CRITICAL RESULT CALLED TO, READ BACK BY AND VERIFIED WITH: T HANDY RN [phone] 06/13/18 A BROWNING    Klebsiella oxytoca NOT DETECTED NOT DETECTED Final   Klebsiella pneumoniae NOT DETECTED NOT DETECTED  Final   Proteus species NOT DETECTED NOT DETECTED Final   Serratia marcescens NOT DETECTED NOT DETECTED Final   Carbapenem resistance NOT DETECTED NOT DETECTED Final   Haemophilus influenzae NOT DETECTED NOT DETECTED Final    Neisseria meningitidis NOT DETECTED NOT DETECTED Final   Pseudomonas aeruginosa NOT DETECTED NOT DETECTED Final   Candida albicans NOT DETECTED NOT DETECTED Final   Candida glabrata NOT DETECTED NOT DETECTED Final   Candida krusei NOT DETECTED NOT DETECTED Final   Candida parapsilosis NOT DETECTED NOT DETECTED Final   Candida tropicalis NOT DETECTED NOT DETECTED Final    Comment: Performed at [facility], [place] [address] [place], [place] [address]  Blood Culture (routine x 2)     Status: Abnormal   Collection Time: 06/12/18 10:17 AM  Result Value Ref Range Status   Specimen Description   Final    BLOOD LEFT ANTECUBITAL DRAWN BY RN Performed at [facility], [address] [place], [place] [id number]    Special Requests   Final    BOTTLES DRAWN AEROBIC AND ANAEROBIC Blood Culture adequate volume Performed at [facility], [address], [place], [place] [address]    Culture  Setup Time   Final    ANAEROBIC BOTTLE ONLY GRAM NEGATIVE RODS Gram Stain Report Called to,Read Back By and Verified With: C HANDY,RN @0037  06/13/18 [facility] Performed at [facility], [address], [place], [place] [address]    Culture (A)  Final    ESCHERICHIA COLI SUSCEPTIBILITIES PERFORMED ON PREVIOUS CULTURE WITHIN THE LAST 5 DAYS. Performed at [facility], [place] [address] [place], [place] [address]    Report Status 06/15/2018 FINAL  Final   Time coordinating discharge: 33 minutes   SIGNED:  Irwin Brakeman, MD  [facility] 06/16/2018, 12:12 PM Pager [phone]  If 7PM-7AM, please contact night-coverage [email or web address] Password [email or web address]

## 2018-06-16 NOTE — Care Management Important Message (Signed)
Important Message  Patient Details  Name: Renato Spellman Franken MRN: [id number] Date of Birth: 10/22/1926   Medicare Important Message Given:  Yes    [name] 06/16/2018, 12:04 PM

## 2018-06-16 NOTE — Discharge Instructions (Signed)
Bacteremia, Adult Bacteremia is the presence of bacteria in the blood. When bacteria enter the bloodstream, they can cause a life-threatening reaction called sepsis, which is a medical emergency. Bacteremia can spread to other parts of the body, including the heart, joints, and brain. What are the causes? This condition is caused by bacteria that get into the blood.  Bacteria can enter the blood: ? From a skin infection or injury, such as a burn or a cut. ? From a lung infection (pneumonia). ? From an infection in your stomach or intestines (gastrointestinal infection). ? From an infection in your bladder or urinary system (urinary tract infection). ? During a dental or medical procedure. ? From bleeding gums. ? When a bacterial infection in another part of your body spreads to your blood. ? Through an unclean (contaminated) needle. What increases the risk? This condition is more likely to develop in children, the elderly, and people who:  Have a long-term (chronic) disease or condition like diabetes or chronic kidney failure.  Have an artificial joint or heart valve.  Have heart valve disease.  Have a tube inserted to treat a medical condition, such as a urinary catheter or IV.  Have a weak disease-fighting system (immune system).  Inject illegal drugs.  Have been hospitalized for more than 10 days in a row. What are the signs or symptoms? Symptoms of this condition include:  Fever.  Chills.  Fast heartbeat.  Shortness of breath.  Dizziness.  Weakness.  Confusion.  Nausea or vomiting.  Diarrhea.  Low blood pressure.  Decreased urine output. Bacteremia that has spread to other parts of the body may cause symptoms in those areas. In some cases, there are no symptoms. How is this diagnosed? This condition may be diagnosed with a physical exam and tests, such as:  A complete blood count (CBC). This test checks for signs of infection.  Blood cultures. These  check for bacteria in your blood.  Tests of any tubes that you have had inserted. These tests check for a source of infection.  Urine tests, including urine cultures. These check for bacteria in the urine that could be a source of infection.  Imaging tests, such as an X-ray, CT scan, MRI, or heart ultrasound. These check for a source of infection in other parts of your body, such as your lungs, heart valves, or joints. How is this treated? This condition is usually treated in the hospital. Treatment may involve:  Antibiotic medicines. These may be given by mouth (orally) or directly into your blood through an IV (infusion through your vein). ? Depending on the source of infection, you may need antibiotics for several weeks. ? At first, you may be given an antibiotic to kill most types of blood bacteria (broad-spectrum antibiotic). If your test results show that a certain kind of bacteria is causing the problem, you may be given a different antibiotic to kill that specific bacteria.  IV fluids.  Removing any catheter or device that could be a source of infection.  Blood pressure and breathing support, if needed.  Surgery to control the source or the spread of infection, such as surgery to remove an infected device, abscess, or tissue.  Having follow-up visits for medicines, blood tests, and further evaluation. Follow these instructions at home: Medicines  Take over-the-counter and prescription medicines only as told by your health care provider.  If you were prescribed an antibiotic medicine, take it as told by your health care provider. Do not stop taking the  antibiotic even if you start to feel better. General instructions   Rest as needed. Ask your health care provider when you may return to normal activities.  Drink enough fluid to keep your urine pale yellow.  Do not use any products that contain nicotine or tobacco, such as cigarettes and e-cigarettes. If you need help  quitting, ask your health care provider.  Keep all follow-up visits as told by your health care provider. This is important. How is this prevented?   Wash your hands regularly with soap and water. If soap and water are not available, use hand sanitizer.  You should wash your hands: ? After using the toilet or changing a diaper. ? Before preparing, cooking, or serving food. ? While caring for a sick person or while visiting someone in a hospital. ? Before and after changing bandages (dressings) over wounds.  Clean any scrapes or cuts with soap and water and cover them with clean dressings.  Get vaccinations as recommended by your health care provider.  Practice good oral hygiene. Brush your teeth two times a day, and floss regularly.  Take good care of your skin. This includes bathing and moisturizing on a regular basis. Get help right away if you have:  Pain.  A fever or chills.  Trouble breathing.  A fast heart rate.  Skin that is blotchy, pale, or clammy.  Confusion.  Weakness.  Lack of energy (lethargy) or unusual sleepiness.  Diarrhea.  New symptoms that develop after treatment has started. These symptoms may represent a serious problem that is an emergency. Do not wait to see if the symptoms will go away. Get medical help right away. Call your local emergency services (911 in the [place] Do not drive yourself to the hospital. Summary  Bacteremia is the presence of bacteria in the blood. When bacteria enter the bloodstream, they can cause a life-threatening reaction called sepsis.  Some symptoms of bacteremia include fever, chills, shortness of breath, confusion, nausea or vomiting, and diarrhea.  Tests may be done to find the source of infection that led to bacteremia. These tests may include blood tests, urine tests, and imaging tests.  Bacteremia is usually treated with antibiotic medicines in the hospital.  Get help right away if you have any new symptoms  that develop after treatment has started. This information is not intended to replace advice given to you by your health care provider. Make sure you discuss any questions you have with your health care provider. Document Released: 03/25/2006 Document Revised: 10/21/2017 Document Reviewed: 10/21/2017 Elsevier Interactive Patient Education  2019 [place].   Follow with Primary MD  Lemmie Evens, MD  and other consultant's as instructed your Hospitalist MD  Please get a complete blood count and chemistry panel checked by your Primary MD at your next visit, and again as instructed by your Primary MD.  Get Medicines reviewed and adjusted: Please take all your medications with you for your next visit with your Primary MD  Laboratory/radiological data: Please request your Primary MD to go over all hospital tests and procedure/radiological results at the follow up, please ask your Primary MD to get all Hospital records sent to his/her office.  In some cases, they will be blood work, cultures and biopsy results pending at the time of your discharge. Please request that your primary care M.D. follows up on these results.  Also Note the following: If you experience worsening of your admission symptoms, develop shortness of breath, life threatening emergency, suicidal or homicidal  thoughts you must seek medical attention immediately by calling 911 or calling your MD immediately  if symptoms less severe.  You must read complete instructions/literature along with all the possible adverse reactions/side effects for all the Medicines you take and that have been prescribed to you. Take any new Medicines after you have completely understood and accpet all the possible adverse reactions/side effects.   Do not drive when taking Pain medications or sleeping medications (Benzodaizepines)  Do not take more than prescribed Pain, Sleep and Anxiety Medications. It is not advisable to combine anxiety,sleep and  pain medications without talking with your primary care practitioner  Special Instructions: If you have smoked or chewed Tobacco  in the last 2 yrs please stop smoking, stop any regular Alcohol  and or any Recreational drug use.  Wear Seat belts while driving.  Please note: You were cared for by a hospitalist during your hospital stay. Once you are discharged, your primary care physician will handle any further medical issues. Please note that NO REFILLS for any discharge medications will be authorized once you are discharged, as it is imperative that you return to your primary care physician (or establish a relationship with a primary care physician if you do not have one) for your post hospital discharge needs so that they can reassess your need for medications and monitor your lab values.     E. Coli Infection E. coli (Escherichia coli) are bacteria that can cause an infection in different parts of your body, including your intestines. E. coli bacteria normally live in the intestines of people and animals. Most types of E. coli do not cause infections, but some produce a poison (toxin) that can cause diarrhea. Depending on the toxin, this can cause mild or severe diarrhea. This condition is contagious. This means that it can spread from person to person. It can also spread from animals to humans. Most cases of E. coli infection come from cows (cattle). In some cases, this infection can cause a dangerous complication called hemolytic uremic syndrome (HUS), which leads to blood cell abnormalities and kidney failure. What are the causes? This condition is caused by a bacteria. You may get this bacteria by:  Eating raw or undercooked beef from infected cattle.  Touching an infected animal and then touching your mouth.  Eating raw fruits or vegetables that have come in contact with the feces of infected animals.  Drinking fluids that have been contaminated with E. coli from infected  animals.  Coming into contact with a surface that has been contaminated by an infected person. What increases the risk? This condition is more likely to develop in people who:  Are young children or older adults.  Eat raw or undercooked beef.  Drink raw (unpasteurized) milk, cider, or juice.  Eat soft cheeses made from unpasteurized milk.  Eat raw vegetables such as spinach or lettuce.  Are in close contact with cattle, goats, or sheep.  Having a weak body defense system (immune system). What are the signs or symptoms? Symptoms of this condition usually start 3-4 days after the bacteria are swallowed (ingested). Symptoms include:  Severe cramps and tenderness in the abdomen.  Diarrhea. This may be watery or bloody.  Nausea and vomiting.  Dehydration. This can cause fatigue, thirst, a dry mouth, and less frequent urination.  Low fever. This is not common. How is this diagnosed? This condition may be diagnosed based on:  A medical history.  A physical exam.  A stool culture. This involves  testing a sample of stool for E. coli or toxins of E. coli. How is this treated? Treatment for this condition includes rest and fluids (supportive care). If you have severe diarrhea, you may need to receive fluids through an IV. Symptoms of E. coli intestinal infection usually go away in 5-10 days. Some strains of E. coli may be treated with antibiotic or antidiarrheal medicines. However, these medicines are rarely given because they increase your risk for HUS. Follow these instructions at home: Eating and drinking      Drink enough fluids to keep your urine pale yellow. You may need to drink small amounts of clear liquids frequently.  Take an oral rehydration solution (ORS) as told by your health care provider. This drink is sold at pharmacies and retail stores.  Drink clear fluids, such as water, ice chips, diluted fruit juice, and low-calorie sports drinks.  Eat bland,  easy-to-digest foods in small amounts as you are able. These foods include bananas, applesauce, rice, lean meats, toast, and crackers.  Eat small, frequent meals rather than large meals.  Do not drink milk, caffeine, or alcohol. Food safety  Do not eat raw or undercooked beef.  Do not drink unpasteurized milk or eat cheese that was made with raw milk. Do not drink unpasteurized apple cider.  Wash cutting boards, counters, and utensils with hot soap and water after you prepare raw meat.  Wash all fruits and vegetables before you eat or cook them. General instructions   Take over-the-counter and prescription medicines only as told by your health care provider.  Wash your hands thoroughly: ? Before and after you prepare food. ? After you use the bathroom. ? Before you eat. ? After touching animals, especially cattle. ? After caring for an ill person.  Make sure people who live with you also wash their hands often. If soap and water are not available, use alcohol-based hand sanitizer.  Clean surfaces that you touch with a product that contains chlorine bleach.  Keep all follow-up visits as told by your health care provider. This is important. Contact a health care provider if:  Your symptoms do not get better or get worse.  You have new symptoms. Get help right away if you:  Have increasing pain or tenderness in your abdomen.  Have ongoing (persistent) vomiting or diarrhea.  Have abdominal pain that stays in one area (localizes).  Have diarrhea with more blood in it.  Have a fever.  Cannot eat or drink without vomiting.  Have signs of dehydration or HUS, such as: ? Pale skin. ? Dark urine, very little urine, or no urine. ? Cracked lips. ? Not making tears while crying. ? Dry mouth. ? Sunken eyes. ? Sleepiness. ? Weakness. ? Dizziness. Summary  E. coli are bacteria that can cause an infection in different parts of your body, including your intestines. Most  types of E. coli do not cause infections, but some produce a toxin that can cause diarrhea.  Treatment for this condition includes rest and fluids. If you have severe diarrhea, you may need to receive fluids through an IV.  Symptoms of E. coli intestinal infection usually go away in 5-10 days.  Follow your health care provider's instructions about medicines, eating and drinking, food safety and general hygiene, and when to call for help. This information is not intended to replace advice given to you by your health care provider. Make sure you discuss any questions you have with your health care provider. Document Released: 03/07/2005  Document Revised: 07/17/2017 Document Reviewed: 07/17/2017 Elsevier Interactive Patient Education  [employer].

## 2018-06-16 NOTE — Plan of Care (Signed)

## 2018-06-16 NOTE — Plan of Care (Signed)
  Problem: Education: Goal: Knowledge of General Education information will improve Description Including pain rating scale, medication(s)/side effects and non-pharmacologic comfort measures 06/16/2018 1421 by Rance Muir, RN Outcome: Progressing 06/16/2018 1139 by Rance Muir, RN Outcome: Progressing   Problem: Health Behavior/Discharge Planning: Goal: Ability to manage health-related needs will improve 06/16/2018 1421 by Rance Muir, RN Outcome: Progressing 06/16/2018 1139 by Rance Muir, RN Outcome: Progressing   Problem: Clinical Measurements: Goal: Ability to maintain clinical measurements within normal limits will improve 06/16/2018 1421 by Rance Muir, RN Outcome: Progressing 06/16/2018 1139 by Rance Muir, RN Outcome: Progressing Goal: Will remain free from infection 06/16/2018 1421 by Rance Muir, RN Outcome: Progressing 06/16/2018 1139 by Rance Muir, RN Outcome: Progressing Goal: Diagnostic test results will improve 06/16/2018 1421 by Rance Muir, RN Outcome: Progressing 06/16/2018 1139 by Rance Muir, RN Outcome: Progressing Goal: Respiratory complications will improve 06/16/2018 1421 by Rance Muir, RN Outcome: Progressing 06/16/2018 1139 by Rance Muir, RN Outcome: Progressing Goal: Cardiovascular complication will be avoided 06/16/2018 1421 by Rance Muir, RN Outcome: Progressing 06/16/2018 1139 by Rance Muir, RN Outcome: Progressing   Problem: Activity: Goal: Risk for activity intolerance will decrease 06/16/2018 1421 by Rance Muir, RN Outcome: Progressing 06/16/2018 1139 by Rance Muir, RN Outcome: Progressing   Problem: Nutrition: Goal: Adequate nutrition will be maintained 06/16/2018 1421 by Rance Muir, RN Outcome: Progressing 06/16/2018 1139 by Rance Muir, RN Outcome: Progressing   Problem: Coping: Goal: Level of anxiety will decrease 06/16/2018 1421 by Rance Muir, RN Outcome: Progressing 06/16/2018 1139 by Rance Muir, RN Outcome: Progressing   Problem:  Elimination: Goal: Will not experience complications related to bowel motility 06/16/2018 1421 by Rance Muir, RN Outcome: Progressing 06/16/2018 1139 by Rance Muir, RN Outcome: Progressing Goal: Will not experience complications related to urinary retention 06/16/2018 1421 by Rance Muir, RN Outcome: Progressing 06/16/2018 1139 by Rance Muir, RN Outcome: Progressing   Problem: Pain Managment: Goal: General experience of comfort will improve 06/16/2018 1421 by Rance Muir, RN Outcome: Progressing 06/16/2018 1139 by Rance Muir, RN Outcome: Progressing   Problem: Safety: Goal: Ability to remain free from injury will improve 06/16/2018 1421 by Rance Muir, RN Outcome: Progressing 06/16/2018 1139 by Rance Muir, RN Outcome: Progressing   Problem: Skin Integrity: Goal: Risk for impaired skin integrity will decrease 06/16/2018 1421 by Rance Muir, RN Outcome: Progressing 06/16/2018 1139 by Rance Muir, RN Outcome: Progressing

## 2018-06-16 NOTE — Care Management Note (Signed)
Case Management Note  Patient Details  Name: [name] MRN: [id number] Date of Birth: [birth year]  Subjective/Objective:   Admitted with sepsis. Pt from home, lives with son and son's spouse Lynelle Smoke) who is primary caregiver. Pt active with [facility] P[facility]. Ready for DC today.                 Action/Plan: DC discussed DC plan with Tammy. Reviewed AVS. Pt will resume [place] services with [facility]. Rep made aware of DC today. Tammy aware HH has 48 hrs to resume services. Pt requires EMS transport home.  CM has arranged for EMS Transport at 3pm per Tammy request.    Expected Discharge Date:  06/16/18               Expected Discharge Plan:  [place]  In-House Referral:  NA  Discharge planning Services  CM Consult  Post Acute Care Choice:  Home Health, Resumption of Svcs/PTA Provider Choice offered to:  Adult Children  HH Arranged:  RN, PT, OT, Nurse's Aide, Speech Therapy HH Agency:  [facility] (formerly [facility]  Status of Service:  Completed, signed off  Sherald Barge, RN 06/16/2018, 12:30 PM

## 2018-12-23 ENCOUNTER — Other Ambulatory Visit: Payer: Self-pay

## 2018-12-23 ENCOUNTER — Emergency Department (HOSPITAL_COMMUNITY)
Admission: EM | Admit: 2018-12-23 | Discharge: 2018-12-23 | Disposition: A | Discharge status: Home / Self Care | Attending: Emergency Medicine | Admitting: Emergency Medicine

## 2018-12-23 ENCOUNTER — Encounter (HOSPITAL_COMMUNITY): Payer: Self-pay | Admitting: Emergency Medicine

## 2018-12-23 DIAGNOSIS — I10 Essential (primary) hypertension: Secondary | ICD-10-CM | POA: Diagnosis not present

## 2018-12-23 DIAGNOSIS — F0390 Unspecified dementia without behavioral disturbance: Secondary | ICD-10-CM | POA: Insufficient documentation

## 2018-12-23 DIAGNOSIS — E119 Type 2 diabetes mellitus without complications: Secondary | ICD-10-CM | POA: Diagnosis not present

## 2018-12-23 DIAGNOSIS — N3000 Acute cystitis without hematuria: Secondary | ICD-10-CM | POA: Insufficient documentation

## 2018-12-23 DIAGNOSIS — Z960 Presence of urogenital implants: Secondary | ICD-10-CM | POA: Insufficient documentation

## 2018-12-23 DIAGNOSIS — Z8673 Personal history of transient ischemic attack (TIA), and cerebral infarction without residual deficits: Secondary | ICD-10-CM | POA: Diagnosis not present

## 2018-12-23 DIAGNOSIS — Z978 Presence of other specified devices: Secondary | ICD-10-CM

## 2018-12-23 DIAGNOSIS — Z7982 Long term (current) use of aspirin: Secondary | ICD-10-CM | POA: Diagnosis not present

## 2018-12-23 DIAGNOSIS — R82998 Other abnormal findings in urine: Secondary | ICD-10-CM | POA: Diagnosis present

## 2018-12-23 DIAGNOSIS — Z79899 Other long term (current) drug therapy: Secondary | ICD-10-CM | POA: Insufficient documentation

## 2018-12-23 LAB — URINALYSIS, ROUTINE W REFLEX MICROSCOPIC
Bilirubin Urine: NEGATIVE
Glucose, UA: NEGATIVE mg/dL
Ketones, ur: NEGATIVE mg/dL
Nitrite: NEGATIVE
Protein, ur: 100 mg/dL — AB
Specific Gravity, Urine: 1.011 (ref 1.005–1.030)
WBC, UA: >50 WBC/hpf — ABNORMAL HIGH (ref 0–5)
pH: 6 (ref 5.0–8.0)

## 2018-12-23 MED ORDER — CEPHALEXIN 500 MG PO CAPS
500.0000 mg | ORAL_CAPSULE | Freq: Once | ORAL | Status: AC
  Administered 2018-12-23: 500 mg via ORAL
  Filled 2018-12-23: qty 1

## 2018-12-23 MED ORDER — CEPHALEXIN 500 MG PO CAPS: 500.0000 mg | ORAL_CAPSULE | Freq: Two times a day (BID) | ORAL | 0 refills | Status: AC

## 2018-12-23 NOTE — Discharge Instructions (Signed)
Please read and follow all provided instructions.  Your diagnoses today include:  1. Acute cystitis without hematuria   2. Chronic indwelling Foley catheter     Tests performed today include:  Urine test - suggests that you have an infection in your bladder  Vital signs. See below for your results today.   Medications prescribed:   Keflex (cephalexin) - antibiotic  You have been prescribed an antibiotic medicine: take the entire course of medicine even if you are feeling better. Stopping early can cause the antibiotic not to work.  Home care instructions:  Follow any educational materials contained in this packet.  Follow-up instructions: Please follow-up with your primary care provider in 3 days for recheck.   Return instructions:   Please return to the Emergency Department if you experience worsening symptoms.   Return with fever, worsening pain, persistent vomiting, worsening pain in your back.   Please return if you have any other emergent concerns.  Additional Information:  Your vital signs today were: BP 130/84    Pulse 68    Temp 98.6 F (37 C)    Resp 18    SpO2 98%  If your blood pressure (BP) was elevated above 135/85 this visit, please have this repeated by your doctor within one month. --------------
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
## 2018-12-23 NOTE — ED Triage Notes (Signed)
Pt from home with hospice RN.  Sent to ED for possible UTI.  Chronic cathter present. Urine clear and yellow. Pt very HOH

## 2018-12-25 LAB — URINE CULTURE
Culture: >=100000 — AB
Special Requests: NORMAL

## 2018-12-26 ENCOUNTER — Telehealth: Payer: Self-pay

## 2018-12-26 NOTE — Progress Notes (Signed)
ED Antimicrobial Stewardship Positive Culture Follow Up   [name] is an 85 y.o. male who presented to [facility] on 12/23/2018 with a chief complaint of  Chief Complaint  Patient presents with  . Urinary Tract Infection    Recent Results (from the past 720 hour(s))  Urine culture     Status: Abnormal   Collection Time: 12/23/18  1:48 PM   Specimen: Urine, Catheterized  Result Value Ref Range Status   Specimen Description   Final    URINE, CATHETERIZED Performed at [facility], [address], [place], [place] [address]    Special Requests   Final    Normal Performed at [facility], [address], [place], [place] [address]    Culture >=100,000 COLONIES/mL SERRATIA MARCESCENS (A)  Final   Report Status 12/25/2018 FINAL  Final   Organism ID, Bacteria SERRATIA MARCESCENS (A)  Final      Susceptibility   Serratia marcescens - MIC*    CEFAZOLIN >=64 RESISTANT Resistant     CEFTRIAXONE <=1 SENSITIVE Sensitive     CIPROFLOXACIN >=4 RESISTANT Resistant     GENTAMICIN <=1 SENSITIVE Sensitive     NITROFURANTOIN >=512 RESISTANT Resistant     TRIMETH/SULFA <=20 SENSITIVE Sensitive     * >=100,000 COLONIES/mL SERRATIA MARCESCENS   Presented with hx indwelling catheter - cloudy urine, foul odor; dementia at baseline. No catheter exchange in plan. No previous cx growing serratia. UA positive.   [x]  Treated with cephalexin, organism resistant to prescribed antimicrobial []  Patient discharged originally without antimicrobial agent and treatment is now indicated  New antibiotic prescription: If continued to have symptoms, change cephalexin to cefdinir 300 mg once daily for 7 days.   ED Provider: Suella Broad, PA-C   Antonietta Jewel, PharmD, [place] Clinical Pharmacist  Pager: [phone] Phone: [phone] 12/26/2018, 8:37 AM Clinical Pharmacist Monday - Friday phone -  [phone] Saturday - Sunday phone - [phone]

## 2018-12-26 NOTE — Telephone Encounter (Signed)
Post ED Visit - Positive Culture Follow-up: Unsuccessful Patient Follow-up  Culture assessed and recommendations reviewed by:  []  Elenor Quinones, Pharm.D. []  Heide Guile, Pharm.D., BCPS AQ-ID []  Parks Neptune, Pharm.D., BCPS []  Alycia Rossetti, Pharm.D., BCPS []  [place], [place].D., BCPS, AAHIVP []  Legrand Como, Pharm.D., BCPS, AAHIVP []  Wynell Balloon, PharmD []  Vincenza Hews, PharmD, BCPS Gorden Harms Pharm D Positive urine culture Symptom check  May need abx change []  Patient discharged without antimicrobial prescription and treatment is now indicated []  Organism is resistant to prescribed ED discharge antimicrobial []  Patient with positive blood cultures   Unable to contact patient after 3 attempts, letter will be sent to address on file  Genia Del 12/26/2018, 8:33 AM

## 2019-01-24 DEATH — deceased

## 2019-09-04 IMAGING — CR DG CHEST 1V PORT
1 series · 1 of 1 positions shown · non-contrast
Comparison: CT 04/23/2018.  Chest x-ray 04/22/2018.

CLINICAL DATA: Chronic bilateral pleural effusions.

EXAM:
PORTABLE CHEST 1 VIEW

[portable]
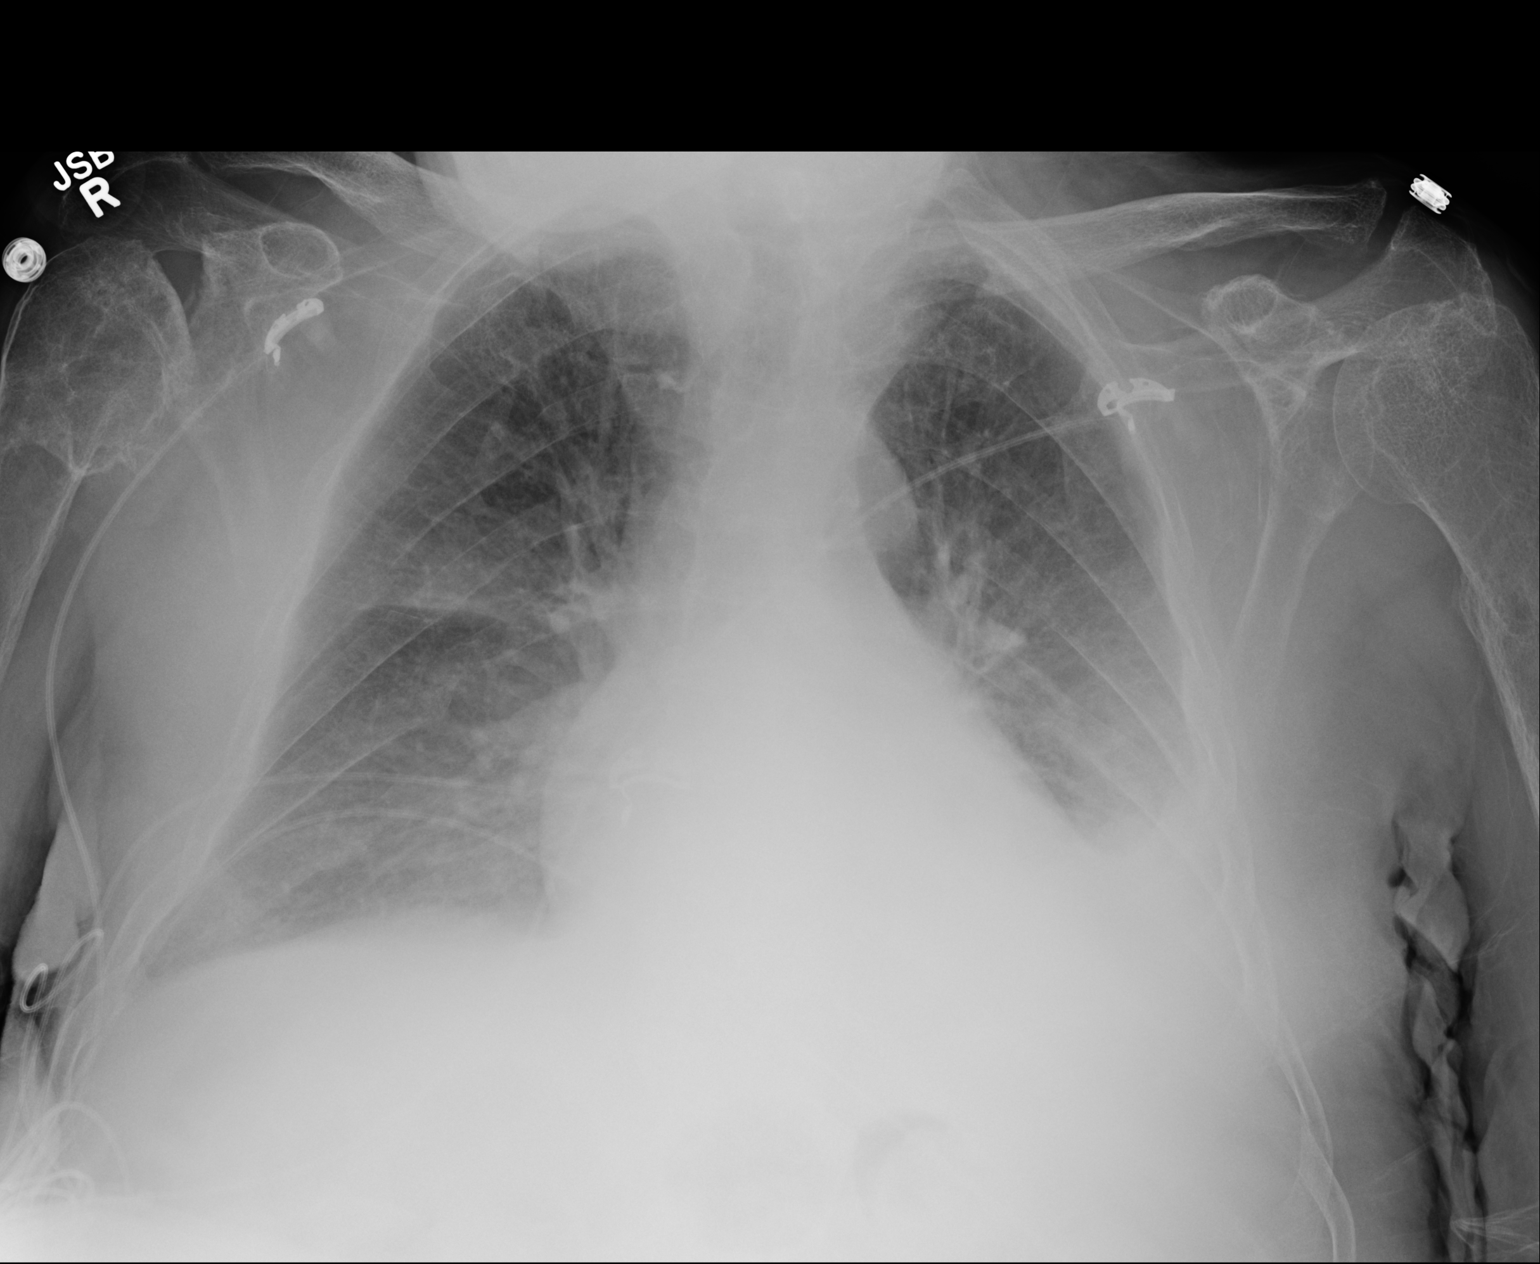

[1 of 1 positions shown; findings below may reference images not displayed]

FINDINGS: Mediastinum hilar structures normal. Cardiomegaly with diffuse
bilateral from interstitial prominence and bilateral pleural
effusions consistent with CHF. Underlying lingular/left base lesions
noted on recent CT may be obscured by pulmonary edema and effusion.
Continued follow-up exams again suggested as noted on prior CT of
04/23/2018. Severe degenerative changes both shoulders.
IMPRESSION: Congestive heart failure with bilateral pulmonary interstitial edema
and bilateral pleural effusions, particularly prominent on the left.

2. Lingular/left base lesions cyst noted on recent CT may be
obscured by pulmonary edema and effusion. Continued follow-up exams
suggested as noted on prior CT of 04/23/2018.

## 2019-09-05 IMAGING — US BILATERAL CAROTID DUPLEX ULTRASOUND
1 series · 13 of 24 positions shown · non-contrast
Comparison: None.

CLINICAL DATA: Stroke follow-up

EXAM:
BILATERAL CAROTID DUPLEX ULTRASOUND
TECHNIQUE: Gray scale imaging, color Doppler and duplex ultrasound were
performed of bilateral carotid and vertebral arteries in the neck.

[Series 1: bilateral carotid duplex ultrasound · 0.06mm/px · 13 of 72 slices shown]
[im 1/72]
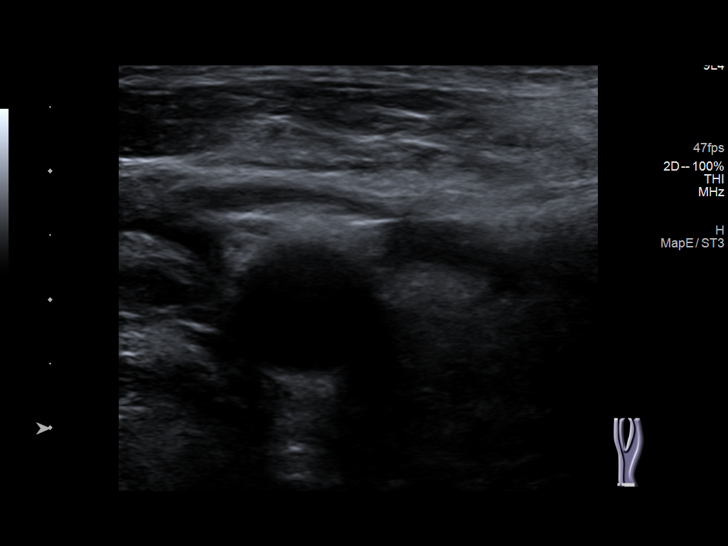
[im 7/72]
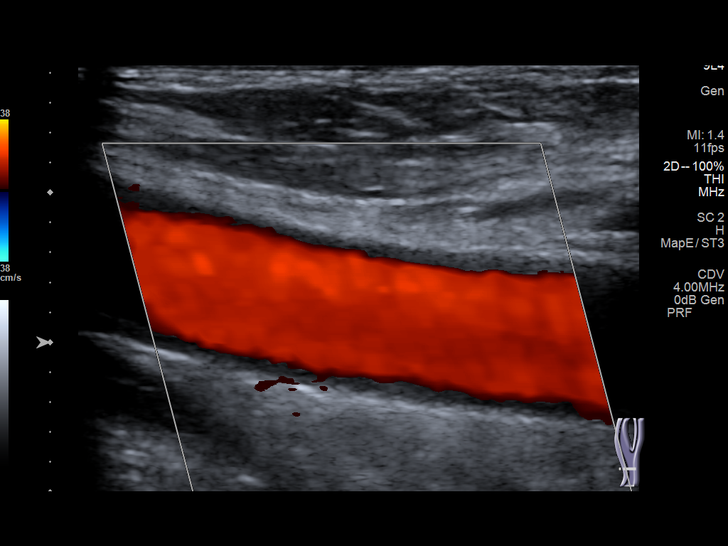
[im 13/72]
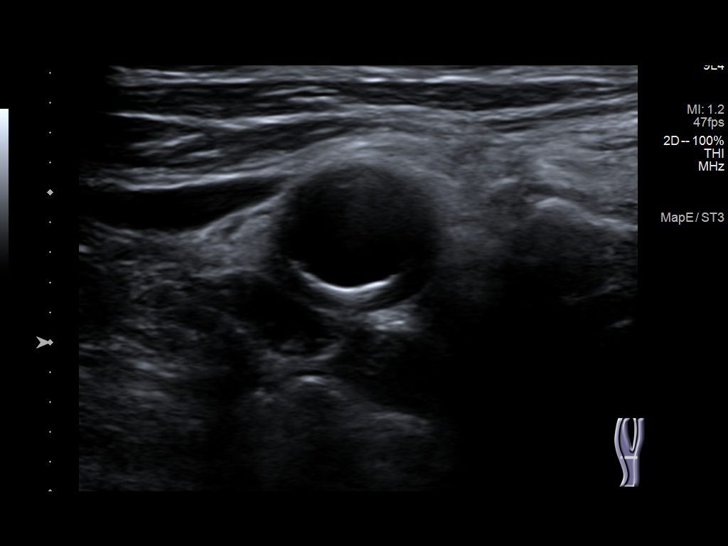
[im 19/72]
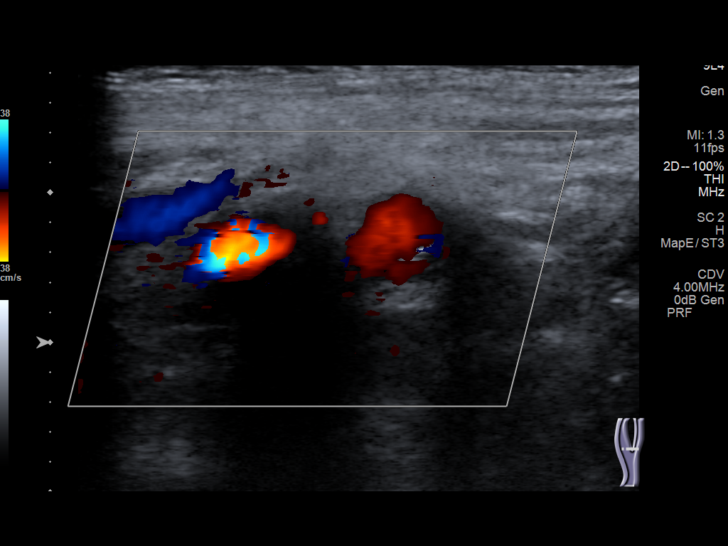
[im 25/72]
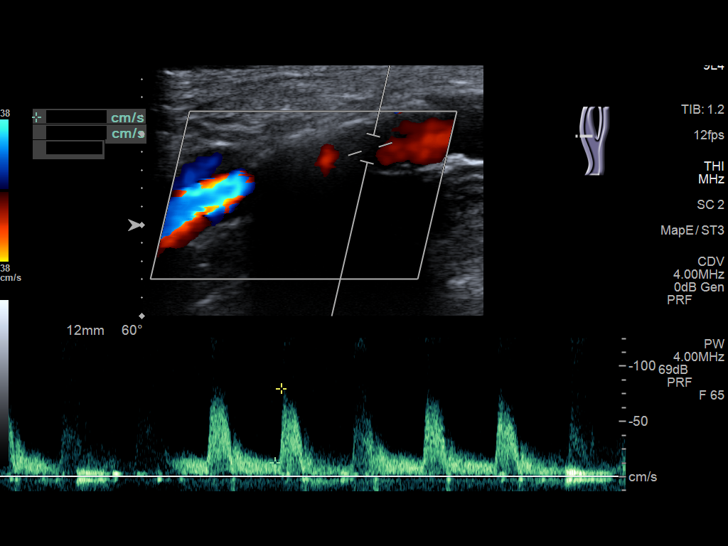
[im 31/72]
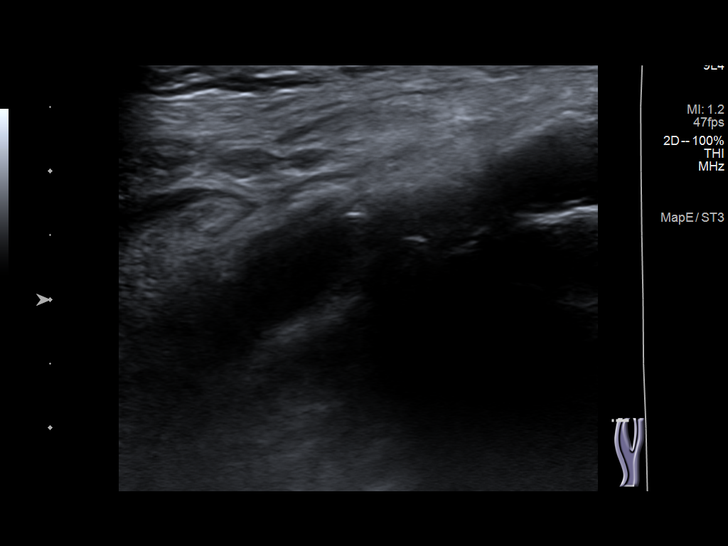
[im 38/72]
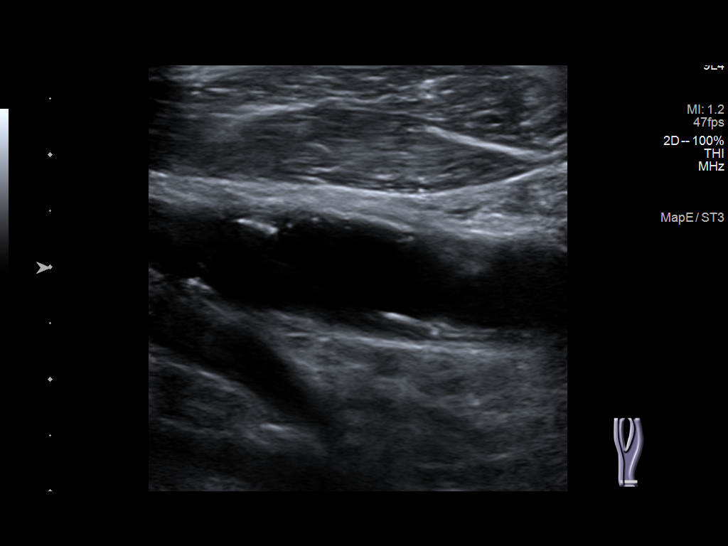
[im 41/72]
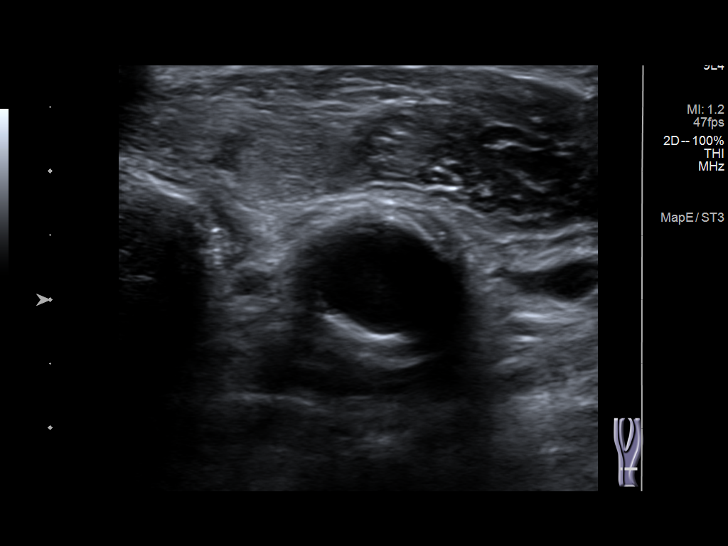
[im 47/72]
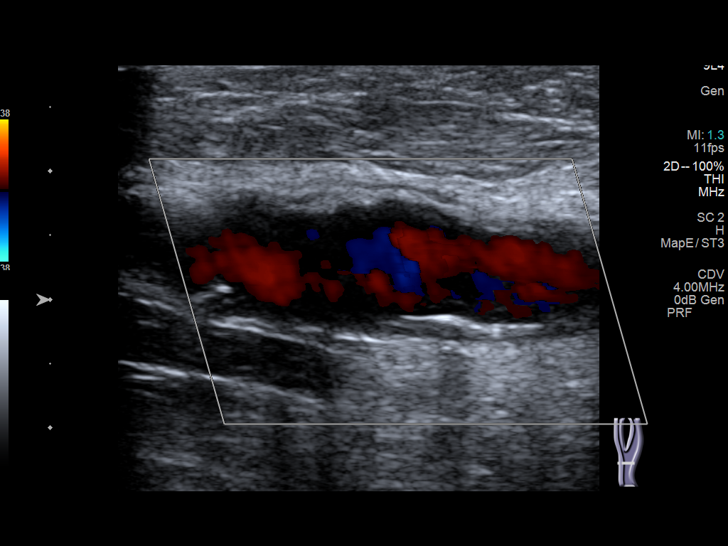
[im 53/72]
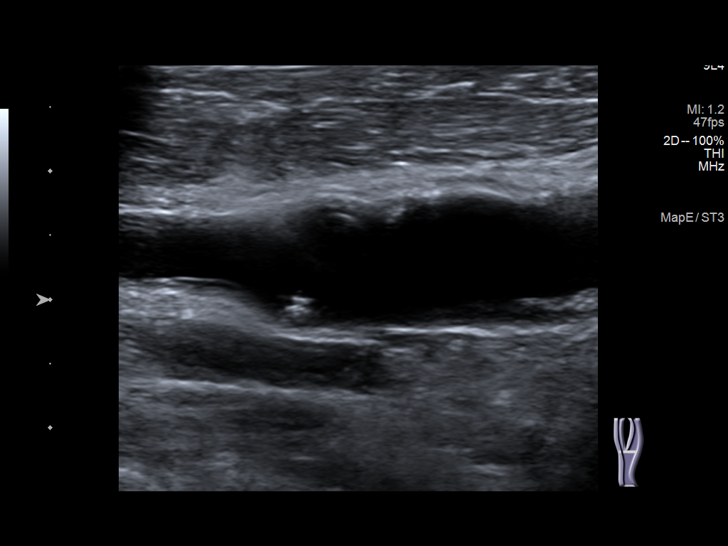
[im 59/72]
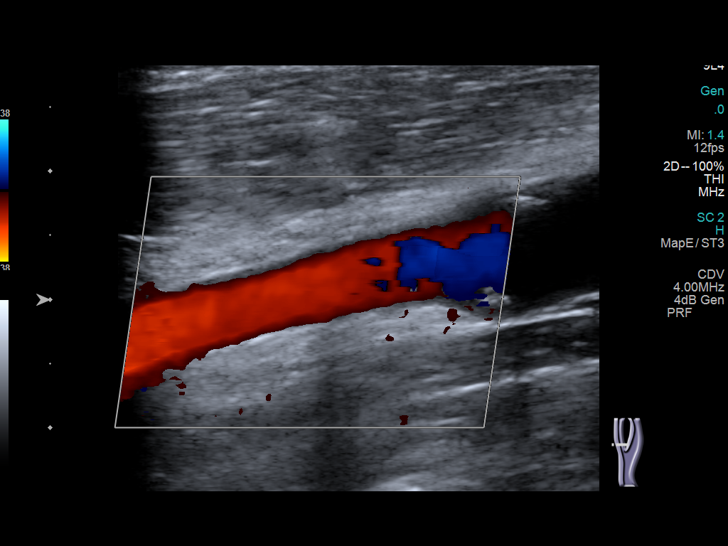
[im 65/72]
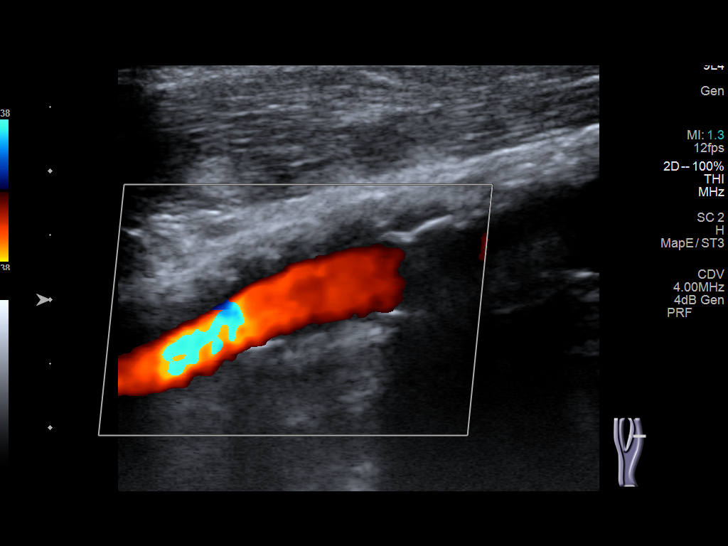
[im 72/72]
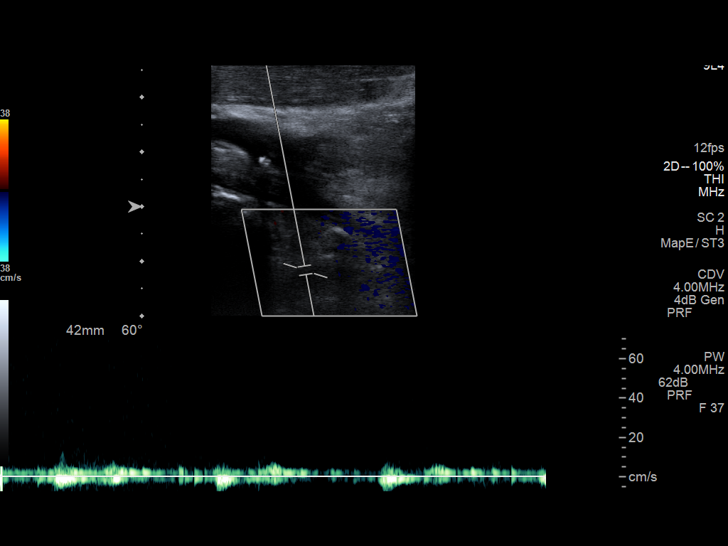

[13 of 24 positions shown; findings below may reference images not displayed]

FINDINGS: Criteria: Quantification of carotid stenosis is based on velocity
parameters that correlate the residual internal carotid diameter
with NASCET-based stenosis levels, using the diameter of the distal
internal carotid lumen as the denominator for stenosis measurement.

The following velocity measurements were obtained:

RIGHT

ICA: 152 cm/sec

CCA: 78 cm/sec

SYSTOLIC ICA/CCA RATIO:

ECA: 124 cm/sec

LEFT

ICA: 106 cm/sec

CCA: 69 cm/sec

SYSTOLIC ICA/CCA RATIO:

ECA: 132 cm/sec

RIGHT CAROTID ARTERY: Moderate calcified plaque in the bulb with
shadowing of the lumen. Low resistance internal carotid Doppler
pattern is preserved.

RIGHT VERTEBRAL ARTERY:  Antegrade.

LEFT CAROTID ARTERY: There is scattered mixed plaque throughout the
left common carotid artery. This continues into the bulb. No obvious
significant luminal narrowing is identified.

LEFT VERTEBRAL ARTERY:  Nonvisualized.
IMPRESSION: 50-69% stenosis in the right internal carotid artery.

Less than 50% stenosis in the left internal carotid artery.

The left vertebral artery cannot be identified.  It may be occluded.

The right vertebral artery is antegrade in flow.
